# Patient Record
Sex: Male | Born: 1955 | Race: White | Hispanic: No | Smoking: Former smoker
Health system: Southern US, Community
[De-identification: ages and names within clinical notes are randomized; demographics above are authoritative.]

## PROBLEM LIST (undated history)

## (undated) ENCOUNTER — Ambulatory Visit (HOSPITAL_COMMUNITY): Admission: EM | Payer: Self-pay | Source: Home / Self Care

## (undated) DIAGNOSIS — E785 Hyperlipidemia, unspecified: Secondary | ICD-10-CM

## (undated) DIAGNOSIS — I2510 Atherosclerotic heart disease of native coronary artery without angina pectoris: Secondary | ICD-10-CM

## (undated) DIAGNOSIS — N200 Calculus of kidney: Secondary | ICD-10-CM

## (undated) DIAGNOSIS — I214 Non-ST elevation (NSTEMI) myocardial infarction: Principal | ICD-10-CM

## (undated) DIAGNOSIS — I10 Essential (primary) hypertension: Secondary | ICD-10-CM

## (undated) DIAGNOSIS — Z720 Tobacco use: Secondary | ICD-10-CM

## (undated) DIAGNOSIS — K219 Gastro-esophageal reflux disease without esophagitis: Secondary | ICD-10-CM

## (undated) HISTORY — DX: Calculus of kidney: N20.0

## (undated) HISTORY — DX: Non-ST elevation (NSTEMI) myocardial infarction: I21.4

---

## 2000-03-15 ENCOUNTER — Emergency Department (HOSPITAL_COMMUNITY): Admission: EM | Admit: 2000-03-15 | Discharge: 2000-03-15 | Payer: Self-pay | Admitting: Emergency Medicine

## 2000-03-17 ENCOUNTER — Emergency Department (HOSPITAL_COMMUNITY): Admission: EM | Admit: 2000-03-17 | Discharge: 2000-03-17 | Payer: Self-pay | Admitting: Emergency Medicine

## 2000-12-09 ENCOUNTER — Emergency Department (HOSPITAL_COMMUNITY): Admission: EM | Admit: 2000-12-09 | Discharge: 2000-12-09 | Payer: Self-pay | Admitting: Emergency Medicine

## 2000-12-09 ENCOUNTER — Encounter: Admission: RE | Admit: 2000-12-09 | Discharge: 2000-12-09 | Payer: Self-pay | Admitting: Internal Medicine

## 2000-12-13 ENCOUNTER — Encounter: Admission: RE | Admit: 2000-12-13 | Discharge: 2001-03-13 | Payer: Self-pay | Admitting: *Deleted

## 2001-01-10 ENCOUNTER — Encounter: Admission: RE | Admit: 2001-01-10 | Discharge: 2001-01-10 | Payer: Self-pay | Admitting: Hematology and Oncology

## 2001-10-04 DIAGNOSIS — E119 Type 2 diabetes mellitus without complications: Secondary | ICD-10-CM | POA: Insufficient documentation

## 2002-01-29 ENCOUNTER — Encounter: Admission: RE | Admit: 2002-01-29 | Discharge: 2002-01-29 | Payer: Self-pay | Admitting: Internal Medicine

## 2002-03-04 ENCOUNTER — Emergency Department (HOSPITAL_COMMUNITY): Admission: EM | Admit: 2002-03-04 | Discharge: 2002-03-05 | Payer: Self-pay | Admitting: Emergency Medicine

## 2002-03-04 ENCOUNTER — Encounter: Payer: Self-pay | Admitting: Emergency Medicine

## 2002-03-12 ENCOUNTER — Ambulatory Visit (HOSPITAL_COMMUNITY): Admission: RE | Admit: 2002-03-12 | Discharge: 2002-03-12 | Payer: Self-pay | Admitting: Internal Medicine

## 2002-03-12 ENCOUNTER — Encounter: Admission: RE | Admit: 2002-03-12 | Discharge: 2002-03-12 | Payer: Self-pay | Admitting: Internal Medicine

## 2002-03-14 ENCOUNTER — Encounter: Admission: RE | Admit: 2002-03-14 | Discharge: 2002-03-14 | Payer: Self-pay | Admitting: Internal Medicine

## 2002-03-16 ENCOUNTER — Inpatient Hospital Stay (HOSPITAL_COMMUNITY): Admission: RE | Admit: 2002-03-16 | Discharge: 2002-03-24 | Payer: Self-pay | Admitting: Cardiology

## 2002-03-16 HISTORY — PX: CORONARY ARTERY BYPASS GRAFT: SHX141

## 2002-03-17 ENCOUNTER — Encounter: Payer: Self-pay | Admitting: Cardiothoracic Surgery

## 2002-03-19 ENCOUNTER — Encounter: Payer: Self-pay | Admitting: Cardiothoracic Surgery

## 2002-03-20 ENCOUNTER — Encounter: Payer: Self-pay | Admitting: Cardiothoracic Surgery

## 2002-03-21 ENCOUNTER — Encounter: Payer: Self-pay | Admitting: Cardiothoracic Surgery

## 2002-03-22 ENCOUNTER — Encounter: Payer: Self-pay | Admitting: Cardiothoracic Surgery

## 2002-04-11 ENCOUNTER — Encounter (HOSPITAL_COMMUNITY): Admission: RE | Admit: 2002-04-11 | Discharge: 2002-07-10 | Payer: Self-pay | Admitting: Cardiology

## 2002-05-04 ENCOUNTER — Encounter: Admission: RE | Admit: 2002-05-04 | Discharge: 2002-08-02 | Payer: Self-pay | Admitting: Cardiothoracic Surgery

## 2002-06-19 ENCOUNTER — Encounter: Admission: RE | Admit: 2002-06-19 | Discharge: 2002-06-19 | Payer: Self-pay | Admitting: Internal Medicine

## 2003-06-12 ENCOUNTER — Encounter: Admission: RE | Admit: 2003-06-12 | Discharge: 2003-06-12 | Payer: Self-pay | Admitting: Internal Medicine

## 2003-09-05 ENCOUNTER — Emergency Department (HOSPITAL_COMMUNITY): Admission: EM | Admit: 2003-09-05 | Discharge: 2003-09-05 | Payer: Self-pay | Admitting: Emergency Medicine

## 2004-10-29 ENCOUNTER — Ambulatory Visit: Payer: Self-pay | Admitting: Internal Medicine

## 2004-11-03 ENCOUNTER — Ambulatory Visit: Payer: Self-pay | Admitting: Internal Medicine

## 2005-11-30 ENCOUNTER — Ambulatory Visit: Payer: Self-pay | Admitting: Internal Medicine

## 2006-02-11 ENCOUNTER — Inpatient Hospital Stay (HOSPITAL_COMMUNITY): Admission: EM | Admit: 2006-02-11 | Discharge: 2006-02-12 | Payer: Self-pay | Admitting: Emergency Medicine

## 2006-02-11 ENCOUNTER — Encounter (INDEPENDENT_AMBULATORY_CARE_PROVIDER_SITE_OTHER): Payer: Self-pay | Admitting: *Deleted

## 2006-02-11 ENCOUNTER — Ambulatory Visit: Payer: Self-pay | Admitting: Hospitalist

## 2006-05-16 ENCOUNTER — Ambulatory Visit: Payer: Self-pay | Admitting: Hospitalist

## 2006-09-24 DIAGNOSIS — E785 Hyperlipidemia, unspecified: Secondary | ICD-10-CM

## 2006-09-24 DIAGNOSIS — I10 Essential (primary) hypertension: Secondary | ICD-10-CM | POA: Insufficient documentation

## 2006-09-24 DIAGNOSIS — K219 Gastro-esophageal reflux disease without esophagitis: Secondary | ICD-10-CM

## 2006-09-24 DIAGNOSIS — F17200 Nicotine dependence, unspecified, uncomplicated: Secondary | ICD-10-CM

## 2006-09-24 DIAGNOSIS — H269 Unspecified cataract: Secondary | ICD-10-CM | POA: Insufficient documentation

## 2006-09-24 DIAGNOSIS — Z951 Presence of aortocoronary bypass graft: Secondary | ICD-10-CM

## 2007-07-24 ENCOUNTER — Telehealth: Payer: Self-pay | Admitting: *Deleted

## 2007-10-23 ENCOUNTER — Ambulatory Visit: Payer: Self-pay | Admitting: Internal Medicine

## 2007-10-23 ENCOUNTER — Encounter (INDEPENDENT_AMBULATORY_CARE_PROVIDER_SITE_OTHER): Payer: Self-pay | Admitting: *Deleted

## 2007-10-23 LAB — CONVERTED CEMR LAB
ALT: 19 units/L (ref 0–53)
AST: 17 units/L (ref 0–37)
Albumin: 4.1 g/dL (ref 3.5–5.2)
Alkaline Phosphatase: 127 units/L — ABNORMAL HIGH (ref 39–117)
BUN: 9 mg/dL (ref 6–23)
Basophils Absolute: 0 10*3/uL (ref 0.0–0.1)
Basophils Relative: 0 % (ref 0–1)
Blood Glucose, Fingerstick: 285
CO2: 24 meq/L (ref 19–32)
Calcium: 8.9 mg/dL (ref 8.4–10.5)
Chloride: 103 meq/L (ref 96–112)
Creatinine, Ser: 0.85 mg/dL (ref 0.40–1.50)
Eosinophils Absolute: 0.4 10*3/uL (ref 0.0–0.7)
Eosinophils Relative: 4 % (ref 0–5)
Glucose, Bld: 246 mg/dL — ABNORMAL HIGH (ref 70–99)
HCT: 48.6 % (ref 39.0–52.0)
Hemoglobin: 16.1 g/dL (ref 13.0–17.0)
Hgb A1c MFr Bld: 9.7 %
Lymphocytes Relative: 32 % (ref 12–46)
Lymphs Abs: 3.1 10*3/uL (ref 0.7–4.0)
MCHC: 33.1 g/dL (ref 30.0–36.0)
MCV: 88.8 fL (ref 78.0–100.0)
Monocytes Absolute: 0.7 10*3/uL (ref 0.1–1.0)
Monocytes Relative: 7 % (ref 3–12)
Neutro Abs: 5.7 10*3/uL (ref 1.7–7.7)
Neutrophils Relative %: 58 % (ref 43–77)
Platelets: 275 10*3/uL (ref 150–400)
Potassium: 4.3 meq/L (ref 3.5–5.3)
RBC: 5.47 M/uL (ref 4.22–5.81)
RDW: 13.1 % (ref 11.5–15.5)
Sodium: 139 meq/L (ref 135–145)
Total Bilirubin: 0.5 mg/dL (ref 0.3–1.2)
Total Protein: 7 g/dL (ref 6.0–8.3)
WBC: 9.9 10*3/uL (ref 4.0–10.5)

## 2007-10-24 DIAGNOSIS — R42 Dizziness and giddiness: Secondary | ICD-10-CM

## 2007-11-01 ENCOUNTER — Encounter (INDEPENDENT_AMBULATORY_CARE_PROVIDER_SITE_OTHER): Payer: Self-pay | Admitting: *Deleted

## 2007-11-01 ENCOUNTER — Ambulatory Visit: Payer: Self-pay | Admitting: Infectious Disease

## 2007-11-01 LAB — CONVERTED CEMR LAB
Cholesterol: 149 mg/dL (ref 0–200)
HDL: 29 mg/dL — ABNORMAL LOW (ref >39)
LDL Cholesterol: 93 mg/dL (ref 0–99)
Total CHOL/HDL Ratio: 5.1
Triglycerides: 136 mg/dL (ref <150)
VLDL: 27 mg/dL (ref 0–40)

## 2008-05-20 ENCOUNTER — Telehealth (INDEPENDENT_AMBULATORY_CARE_PROVIDER_SITE_OTHER): Payer: Self-pay | Admitting: Internal Medicine

## 2008-07-29 ENCOUNTER — Ambulatory Visit: Payer: Self-pay | Admitting: Internal Medicine

## 2008-07-29 ENCOUNTER — Encounter (INDEPENDENT_AMBULATORY_CARE_PROVIDER_SITE_OTHER): Payer: Self-pay | Admitting: Internal Medicine

## 2008-07-29 LAB — CONVERTED CEMR LAB
Blood Glucose, Fingerstick: 206
Creatinine, Urine: 190.5 mg/dL
Hgb A1c MFr Bld: 9.1 %
Microalb Creat Ratio: 24.6 mg/g (ref 0.0–30.0)
Microalb, Ur: 4.69 mg/dL — ABNORMAL HIGH (ref 0.00–1.89)
TSH: 2.595 microintl units/mL (ref 0.350–4.50)
Vitamin B-12: 400 pg/mL (ref 211–911)

## 2008-07-30 ENCOUNTER — Encounter (INDEPENDENT_AMBULATORY_CARE_PROVIDER_SITE_OTHER): Payer: Self-pay | Admitting: Internal Medicine

## 2008-09-05 ENCOUNTER — Emergency Department: Payer: Self-pay | Admitting: Emergency Medicine

## 2008-12-11 ENCOUNTER — Telehealth (INDEPENDENT_AMBULATORY_CARE_PROVIDER_SITE_OTHER): Payer: Self-pay | Admitting: Internal Medicine

## 2009-01-10 ENCOUNTER — Telehealth (INDEPENDENT_AMBULATORY_CARE_PROVIDER_SITE_OTHER): Payer: Self-pay | Admitting: *Deleted

## 2009-01-29 ENCOUNTER — Ambulatory Visit: Payer: Self-pay | Admitting: Internal Medicine

## 2009-01-29 ENCOUNTER — Encounter (INDEPENDENT_AMBULATORY_CARE_PROVIDER_SITE_OTHER): Payer: Self-pay | Admitting: Internal Medicine

## 2009-01-29 ENCOUNTER — Telehealth (INDEPENDENT_AMBULATORY_CARE_PROVIDER_SITE_OTHER): Payer: Self-pay | Admitting: Internal Medicine

## 2009-01-29 LAB — CONVERTED CEMR LAB
Blood Glucose, Fingerstick: 246
Blood Glucose, Home Monitor: 4 mg/dL
Creatinine, Urine: 198.8 mg/dL
Hgb A1c MFr Bld: 11 %
Microalb Creat Ratio: 18.6 mg/g (ref 0.0–30.0)
Microalb, Ur: 3.7 mg/dL — ABNORMAL HIGH (ref 0.00–1.89)

## 2009-01-30 LAB — CONVERTED CEMR LAB
Cholesterol: 235 mg/dL — ABNORMAL HIGH (ref 0–200)
HCT: 45.6 % (ref 39.0–52.0)
HDL: 34 mg/dL — ABNORMAL LOW (ref >39)
Hemoglobin: 15.3 g/dL (ref 13.0–17.0)
MCHC: 33.6 g/dL (ref 30.0–36.0)
MCV: 92.9 fL (ref 78.0–100.0)
Platelets: 232 10*3/uL (ref 150–400)
RBC: 4.91 M/uL (ref 4.22–5.81)
RDW: 13.5 % (ref 11.5–15.5)
Total CHOL/HDL Ratio: 6.9
Triglycerides: 413 mg/dL — ABNORMAL HIGH (ref <150)
WBC: 10.5 10*3/uL (ref 4.0–10.5)

## 2009-01-31 ENCOUNTER — Telehealth (INDEPENDENT_AMBULATORY_CARE_PROVIDER_SITE_OTHER): Payer: Self-pay | Admitting: Internal Medicine

## 2009-02-12 ENCOUNTER — Ambulatory Visit: Payer: Self-pay | Admitting: Infectious Disease

## 2009-02-12 ENCOUNTER — Telehealth (INDEPENDENT_AMBULATORY_CARE_PROVIDER_SITE_OTHER): Payer: Self-pay | Admitting: Internal Medicine

## 2009-02-12 ENCOUNTER — Encounter (INDEPENDENT_AMBULATORY_CARE_PROVIDER_SITE_OTHER): Payer: Self-pay | Admitting: Internal Medicine

## 2009-02-12 LAB — CONVERTED CEMR LAB: Blood Glucose, Fingerstick: 135

## 2009-02-17 ENCOUNTER — Telehealth: Payer: Self-pay | Admitting: *Deleted

## 2009-02-17 LAB — CONVERTED CEMR LAB
ALT: 27 units/L (ref 0–53)
AST: 27 units/L (ref 0–37)
Albumin: 4.2 g/dL (ref 3.5–5.2)
Alkaline Phosphatase: 113 units/L (ref 39–117)
BUN: 14 mg/dL (ref 6–23)
CO2: 24 meq/L (ref 19–32)
Calcium: 9.4 mg/dL (ref 8.4–10.5)
Chloride: 102 meq/L (ref 96–112)
Cholesterol: 188 mg/dL (ref 0–200)
Creatinine, Ser: 0.71 mg/dL (ref 0.40–1.50)
GFR calc Af Amer: >60 mL/min (ref >60)
GFR calc non Af Amer: >60 mL/min (ref >60)
Glucose, Bld: 98 mg/dL (ref 70–99)
HDL: 34 mg/dL — ABNORMAL LOW (ref >39)
LDL Cholesterol: 130 mg/dL — ABNORMAL HIGH (ref 0–99)
Potassium: 4.3 meq/L (ref 3.5–5.3)
Sodium: 138 meq/L (ref 135–145)
Total Bilirubin: 1 mg/dL (ref 0.3–1.2)
Total CHOL/HDL Ratio: 5.5
Total Protein: 7.1 g/dL (ref 6.0–8.3)
Triglycerides: 122 mg/dL (ref <150)
VLDL: 24 mg/dL (ref 0–40)

## 2009-02-26 ENCOUNTER — Encounter (INDEPENDENT_AMBULATORY_CARE_PROVIDER_SITE_OTHER): Payer: Self-pay | Admitting: Internal Medicine

## 2009-03-12 ENCOUNTER — Telehealth (INDEPENDENT_AMBULATORY_CARE_PROVIDER_SITE_OTHER): Payer: Self-pay | Admitting: Internal Medicine

## 2009-03-14 ENCOUNTER — Ambulatory Visit: Payer: Self-pay | Admitting: Internal Medicine

## 2009-03-14 ENCOUNTER — Encounter (INDEPENDENT_AMBULATORY_CARE_PROVIDER_SITE_OTHER): Payer: Self-pay | Admitting: Internal Medicine

## 2009-03-14 LAB — CONVERTED CEMR LAB

## 2009-05-23 ENCOUNTER — Telehealth (INDEPENDENT_AMBULATORY_CARE_PROVIDER_SITE_OTHER): Payer: Self-pay | Admitting: Internal Medicine

## 2009-06-19 ENCOUNTER — Telehealth (INDEPENDENT_AMBULATORY_CARE_PROVIDER_SITE_OTHER): Payer: Self-pay | Admitting: *Deleted

## 2009-08-28 ENCOUNTER — Ambulatory Visit: Payer: Self-pay | Admitting: Internal Medicine

## 2009-08-28 ENCOUNTER — Encounter: Payer: Self-pay | Admitting: Internal Medicine

## 2009-08-28 ENCOUNTER — Inpatient Hospital Stay (HOSPITAL_COMMUNITY): Admission: EM | Admit: 2009-08-28 | Discharge: 2009-08-30 | Payer: Self-pay | Admitting: Emergency Medicine

## 2009-08-28 ENCOUNTER — Ambulatory Visit: Payer: Self-pay | Admitting: Cardiovascular Disease

## 2009-08-29 ENCOUNTER — Encounter: Payer: Self-pay | Admitting: Cardiovascular Disease

## 2009-08-29 ENCOUNTER — Encounter: Payer: Self-pay | Admitting: Internal Medicine

## 2009-08-30 ENCOUNTER — Encounter (INDEPENDENT_AMBULATORY_CARE_PROVIDER_SITE_OTHER): Payer: Self-pay | Admitting: Internal Medicine

## 2009-08-31 LAB — CONVERTED CEMR LAB: Hgb A1c MFr Bld: 7.8 %

## 2009-09-30 ENCOUNTER — Ambulatory Visit: Payer: Self-pay | Admitting: Internal Medicine

## 2009-11-18 ENCOUNTER — Encounter (INDEPENDENT_AMBULATORY_CARE_PROVIDER_SITE_OTHER): Payer: Self-pay | Admitting: Internal Medicine

## 2010-01-06 ENCOUNTER — Encounter (INDEPENDENT_AMBULATORY_CARE_PROVIDER_SITE_OTHER): Payer: Self-pay | Admitting: Internal Medicine

## 2010-01-19 ENCOUNTER — Telehealth (INDEPENDENT_AMBULATORY_CARE_PROVIDER_SITE_OTHER): Payer: Self-pay | Admitting: Internal Medicine

## 2010-06-01 ENCOUNTER — Telehealth: Payer: Self-pay | Admitting: Internal Medicine

## 2010-06-01 ENCOUNTER — Telehealth: Payer: Self-pay | Admitting: *Deleted

## 2010-06-28 ENCOUNTER — Emergency Department (HOSPITAL_COMMUNITY): Admission: EM | Admit: 2010-06-28 | Discharge: 2010-06-28 | Payer: Self-pay | Admitting: Emergency Medicine

## 2010-09-07 ENCOUNTER — Ambulatory Visit: Payer: Self-pay | Admitting: Internal Medicine

## 2010-09-07 DIAGNOSIS — M25559 Pain in unspecified hip: Secondary | ICD-10-CM | POA: Insufficient documentation

## 2010-09-07 LAB — CONVERTED CEMR LAB
BUN: 21 mg/dL (ref 6–23)
Blood Glucose, Fingerstick: 190
CO2: 26 meq/L (ref 19–32)
Calcium: 9.5 mg/dL (ref 8.4–10.5)
Chloride: 104 meq/L (ref 96–112)
Creatinine, Ser: 0.86 mg/dL (ref 0.40–1.50)
Creatinine, Urine: 141.1 mg/dL
Glucose, Bld: 135 mg/dL — ABNORMAL HIGH (ref 70–99)
Hgb A1c MFr Bld: 9.1 %
Microalb Creat Ratio: 14.7 mg/g (ref 0.0–30.0)
Microalb, Ur: 2.08 mg/dL — ABNORMAL HIGH (ref 0.00–1.89)
Potassium: 4.7 meq/L (ref 3.5–5.3)
Sodium: 138 meq/L (ref 135–145)

## 2010-09-23 ENCOUNTER — Encounter: Payer: Self-pay | Admitting: Family Medicine

## 2010-09-23 ENCOUNTER — Ambulatory Visit: Payer: Self-pay | Admitting: Sports Medicine

## 2010-09-23 ENCOUNTER — Encounter: Admission: RE | Admit: 2010-09-23 | Discharge: 2010-09-23 | Payer: Self-pay | Source: Home / Self Care

## 2010-10-21 ENCOUNTER — Ambulatory Visit: Admit: 2010-10-21 | Payer: Self-pay | Admitting: Sports Medicine

## 2010-11-05 NOTE — Assessment & Plan Note (Signed)
Summary: NEED MEDICATION/SB.   Vital Signs:  Patient profile:   55 year old male Height:      70 inches (177.80 cm) Weight:      220.7 pounds (100.32 kg) BMI:     31.78 Temp:     97.5 degrees F (36.39 degrees C) oral Pulse rate:   75 / minute BP sitting:   138 / 83  (right arm) Cuff size:   large  Vitals Entered By: Chinita Pester RN (September 07, 2010 8:59 AM) CC: Check-up.  Needs med.refills.  Also needs flu/pneumonia shots. Has been having right hip/leg pain. Is Patient Diabetic? Yes Did you bring your meter with you today? No Pain Assessment Patient in pain? no      Nutritional Status BMI of > 30 = obese CBG Result 190  Have you ever been in a relationship where you felt threatened, hurt or afraid?No   Does patient need assistance? Functional Status Self care Ambulation Normal   Diabetic Foot Exam Last Podiatry Exam Date: 09/07/2010  Foot Inspection Is there a history of a foot ulcer?              No Is there a foot ulcer now?              No Can the patient see the bottom of their feet?          Yes Are the shoes appropriate in style and fit?          Yes Is there swelling or an abnormal foot shape?          No Are the toenails long?                No Are the toenails thick?                No Are the toenails ingrown?              No Is there heavy callous build-up?              No Is there pain in the calf muscle (Intermittent claudication) when walking?    NoIs there a claw toe deformity?              No Is there elevated skin temperature?            No Is there limited ankle dorsiflexion?            No Is there foot or ankle muscle weakness?            No  Diabetic Foot Care Education Patient educated on appropriate care of diabetic feet.  Pulse Check          Right Foot          Left Foot Dorsalis Pedis:        normal            normal Comments: Callus on bottom of each foot. High Risk Feet? No   10-g (5.07) Semmes-Weinstein Monofilament  Test Performed by: Chinita Pester RN          Right Foot          Left Foot Visual Inspection     normal         normal Test Control      normal         normal Site 1         normal         normal  Site 2         normal         normal Site 3         normal         normal Site 4         normal         normal Site 5         normal         normal Site 6         normal         normal Site 7         normal         normal Site 8         normal         normal Site 9         normal         normal  Impression      normal         normal   Primary Care Provider:  Elyse Jarvis  CC:  Check-up.  Needs med.refills.  Also needs flu/pneumonia shots. Has been having right hip/leg pain.Marland Kitchen  History of Present Illness: Pt with pmh outlined below here for followup visit. Hasn't been seen in about a year. Today he needs refills and also complains of right hip pain since August. States that he went to the emergency room in October for acute right hip pain, states the xrays were normal and was given Oxycodone for pain relief. He describes the pain as a dull ache that's constant, radiates to his right thigh. He has not taken any otc meds for the pain. It is not associated with fever, back pain, numbness or tingling, or any other focal deficits. He cannot identify any specific triggers and states that it is persistent for most of the day and night.    Depression History:      The patient denies a depressed mood most of the day and a diminished interest in his usual daily activities.        Comments:  But being out of work has been stressful.   Preventive Screening-Counseling & Management  Alcohol-Tobacco     Alcohol drinks/day: <1     Smoking Status: current     Smoking Cessation Counseling: yes     Smoke Cessation Stage: ready     Packs/Day: 0.75  Caffeine-Diet-Exercise     Does Patient Exercise: no  Current Problems (verified): 1)  Hip Pain, Right  (ICD-719.45) 2)  Dizziness  (ICD-780.4) 3)  Gerd   (ICD-530.81) 4)  Cataracts  (ICD-366.9) 5)  Tobacco Abuse  (ICD-305.1) 6)  Dyslipidemia  (ICD-272.4) 7)  Coronary Artery Bypass Graft, Three Vessel, Hx of  (ICD-V45.81) 8)  Hypertension  (ICD-401.9) 9)  Diabetes Mellitus, Type II  (ICD-250.00)  Current Medications (verified): 1)  Metformin Hcl 1000 Mg  Tabs (Metformin Hcl) .... Take 1 Tablet By Mouth Twice Daily For Diabetes. 2)  Lisinopril 10 Mg Tabs (Lisinopril) .... Take One Tablet Daily For Blood Pressure. 3)  Ranitidine Hcl 150 Mg Caps (Ranitidine Hcl) .... Take 1 Pill By Mouth Two Times A Day As Needed For Acid Reflux. 4)  Anacin 81 Mg Tbec (Aspirin) .... Take 1 Pill By Mouth Daily. 5)  Pravachol 40 Mg Tabs (Pravastatin Sodium) .... Take 1 Pill By Mouth Daily. 6)  Insulin Syringe 31g X 5/16" 0.5 Ml Misc (Insulin Syringe-Needle U-100) .... Use To Inject Insulin Twice Daily 7)  Truetrack Test  Strp (Glucose Blood) .... Use To Check Blood Sugar4-6x Daily 8)  Lancets  Misc (Lancets) .... Use To Check Blood Sugar 4-6x Daily 9)  Novolin 70/30 70-30 % Susp (Insulin Isophane & Regular) .... Inject Into The Skin of Your Abdomen 35 Units in The Morning and 15 Units in The Evening. 10)  Cialis 10 Mg Tabs (Tadalafil) .... Take 1 Pill By Mouth Daily As Needed 30 Minutes Before Anticipated Sexual Activity. Don't Take With Nitrate Containing Drugs. 11)  Metoprolol Tartrate 25 Mg Tabs (Metoprolol Tartrate) .... Take 1 Pill By Mouth Two Times A Day. 12)  Nicoderm Cq 14 Mg/24hr Pt24 (Nicotine) .... Apply 1 Patch On Your Skin Daily. 13)  Ibuprofen 600 Mg Tabs (Ibuprofen) .... Take One Tablet Every 6 Hours As Needed For Pain. Take With Meals!  Allergies (verified): No Known Drug Allergies  Past History:  Past Medical History: Last updated: 09/24/2006 Diabetes mellitus, type II Hypertension GERD  Past Surgical History: Last updated: 01/29/2009 Coronary artery bypass graft three vessle 2004 at [facility].  Family History: Last updated:  10/23/2007 Mother: living, 30: hypoglycemia Father: living, 22: MI (at age 71), colon CA ( ~1y ago) Siblings: 1 sister (healthy) Children: healthy  Social History: Last updated: 10/23/2007 Works as a Location manager.  Divorced.  Lives in [place].  Lives with his 3 children. Smokes 1ppd x 30y.  Drinks 1-2 beers/week.  No illicit drugs.  Risk Factors: Alcohol Use: <1 (09/07/2010) Exercise: no (09/07/2010)  Risk Factors: Smoking Status: current (09/07/2010) Packs/Day: 0.75 (09/07/2010)  Social History: Packs/Day:  0.75  Physical Exam  General:  alert.  well-developed.   Head:  normocephalic and atraumatic.   Eyes:  vision grossly intact.   Ears:  no external deformities.   Nose:  no external deformity and no nasal discharge.   Lungs:  normal respiratory effort, normal breath sounds, no crackles, and no wheezes.   Heart:  normal rate, regular rhythm, no gallop, and no rub.   Abdomen:  soft, non-tender, and normal bowel sounds.   Msk:  normal ROM, no joint tenderness, no joint swelling, and no joint warmth.   Pulses:  R and L radial, dorsalis pedis and posterior tibial pulses are full and equal bilaterally Extremities:  no edema or cyanosis Neurologic:  alert & oriented X3, strength normal in all extremities, sensation intact to light touch, and gait normal.   Skin:  color normal.   Psych:  normally interactive.    Diabetes Management Exam:    Foot Exam (with socks and/or shoes not present):       Sensory-Monofilament:          Left foot: normal          Right foot: normal   Impression & Recommendations:  Problem # 1:  HIP PAIN, RIGHT (ICD-719.45) From history and physical, sounds like an osteoarthritis, unlikely 2/2 nerve impingement given his description. For now, encouraged he tried Ibuprofen on an as needed basis over the next month, arrange a referral to sports medicine and see how he does. He is to come back in about a month so we can see how his doing.   His updated  medication list for this problem includes:    Anacin 81 Mg Tbec (Aspirin) .Marland Kitchen... Take 1 pill by mouth daily.    Ibuprofen 600 Mg Tabs (Ibuprofen) .Marland Kitchen... Take one tablet every 6 hours as needed for pain. take with meals!  Orders: Sports Medicine (Sports Med)  Problem # 2:  DIABETES MELLITUS, TYPE II (ICD-250.00) Not well controlled 2/2 noncompliance. Pt encouraged to take his med exactly as prescribed so we can better manage his med regimen. Will check labs as below since he hasn't been here for over a year. Foot exam ok, will arrange for diabetic eye exam.  No changes to med regimen today.   His updated medication list for this problem includes:    Metformin Hcl 1000 Mg Tabs (Metformin hcl) .Marland Kitchen... Take 1 tablet by mouth twice daily for diabetes.    Lisinopril 10 Mg Tabs (Lisinopril) .Marland Kitchen... Take one tablet daily for blood pressure.    Anacin 81 Mg Tbec (Aspirin) .Marland Kitchen... Take 1 pill by mouth daily.    Novolin 70/30 70-30 % Susp (Insulin isophane & regular) ..... Inject into the skin of your abdomen 35 units in the morning and 15 units in the evening.  Orders: T- Capillary Blood Glucose (82948) T-Hgb A1C (in-house) (45409WJ) T-Urine Microalbumin w/creat. ratio [id number]) T-Basic Metabolic Panel ([phone]) Ophthalmology Referral (Ophthalmology) T-Urine Microalbumin w/creat. ratio [phone])  Labs Reviewed: Creat: 0.71 (02/12/2009)     Last Eye Exam: No diabetic retinopathy.   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 I encouraged that he take his meds exactly as prescribed and to try life style modification with dieting and exercise.  His updated medication  list for this problem includes:    Lisinopril 10 Mg Tabs (Lisinopril) .Marland Kitchen... Take one tablet daily for blood pressure.    Metoprolol Tartrate 25 Mg Tabs (Metoprolol tartrate) .Marland Kitchen... Take 1 pill by mouth two times a day.  BP today: 138/83 Prior BP: 141/82 (09/30/2009)  Labs Reviewed: K+: 4.3 (02/12/2009) Creat: : 0.71 (02/12/2009)   Chol: 188 (02/12/2009)   HDL: 34 (02/12/2009)   LDL: 130 (02/12/2009)   TG: 122 (02/12/2009)  Problem # 4:  DYSLIPIDEMIA (ICD-272.4) Assessment: Comment Only No changes to meds today. Check FLP at next visit.   His updated medication list for this problem includes:    Pravachol 40 Mg Tabs (Pravastatin sodium) .Marland Kitchen... Take 1 pill by mouth daily.  Labs Reviewed: SGOT: 27 (02/12/2009)   SGPT: 27 (02/12/2009)   HDL:34 (02/12/2009), 34 (01/29/2009)  LDL:130 (02/12/2009), See Comment mg/dL (72/53/6644)  IHKV:425 (02/12/2009), 235 (01/29/2009)  Trig:122 (02/12/2009), 413 (01/29/2009)  Complete Medication List: 1)  Metformin Hcl 1000 Mg Tabs (Metformin hcl) .... Take 1 tablet by mouth twice daily for diabetes. 2)  Lisinopril 10 Mg Tabs (Lisinopril) .... Take one tablet daily for blood pressure. 3)  Ranitidine Hcl 150 Mg Caps (Ranitidine hcl) .... Take 1 pill by mouth two times a day as needed for acid reflux. 4)  Anacin 81 Mg Tbec (Aspirin) .... Take 1 pill by mouth daily. 5)  Pravachol 40 Mg Tabs (Pravastatin sodium) .... Take 1 pill by mouth daily. 6)  Insulin Syringe 31g X 5/16" 0.5 Ml Misc (Insulin syringe-needle u-100) .... Use to inject insulin twice daily 7)  Truetrack Test Strp (Glucose blood) .... Use to check blood sugar4-6x daily 8)  Lancets Misc (Lancets) .... Use to check blood sugar 4-6x daily 9)  Novolin 70/30 70-30 % Susp (Insulin isophane & regular) .... Inject into the skin of your abdomen 35 units in  the morning and 15 units in the evening. 10)  Cialis 10 Mg Tabs (Tadalafil) .... Take 1 pill by mouth daily as needed 30 minutes before anticipated  sexual activity. don't take with nitrate containing drugs. 11)  Metoprolol Tartrate 25 Mg Tabs (Metoprolol tartrate) .... Take 1 pill by mouth two times a day. 12)  Nicoderm Cq 14 Mg/24hr Pt24 (Nicotine) .... Apply 1 patch on your skin daily. 13)  Ibuprofen 600 Mg Tabs (Ibuprofen) .... Take one tablet every 6 hours as needed for pain. take with meals!  Other Orders: T-Hemoccult Card-Multiple (take home) (16109) Influenza Vaccine NON MCR (60454)  Patient Instructions: 1)  Pls make sure to take all your meds exactly as prescribed. 2)  Pls record your blood glucose levels at least twice daily. 3)  Call the clinic if you have any questions or concerns. 4)  Please schedule a follow-up appointment in 3 months. Prescriptions: IBUPROFEN 600 MG TABS (IBUPROFEN) take one tablet every 6 hours as needed for pain. Take with meals!  #30 x 0   Entered and Authorized by:   Jaci Lazier MD   Signed by:   Jaci Lazier MD on 09/07/2010   Method used:   Print then Give to Patient   RxID:   [id number] NOVOLIN 70/30 70-30 % SUSP (INSULIN ISOPHANE & REGULAR) Inject into the skin of your abdomen 35 units in the morning and 15 units in the evening.  #1 month x 2   Entered and Authorized by:   Jaci Lazier MD   Signed by:   Jaci Lazier MD on 09/07/2010   Method used:   Print then Give to Patient   RxID:   [id number] LANCETS  MISC (LANCETS) use to check blood sugar 4-6x daily  #200 x 11   Entered and Authorized by:   Jaci Lazier MD   Signed by:   Jaci Lazier MD on 09/07/2010   Method used:   Print then Give to Patient   RxID:   [id number] TEST  STRP (GLUCOSE BLOOD) use to check blood sugar4-6x daily  #200 x 11   Entered and Authorized by:   Jaci Lazier MD   Signed by:   Jaci Lazier MD on 09/07/2010   Method used:   Print then Give to Patient   RxID:   [id number] INSULIN SYRINGE 31G X 5/16" 0.5 ML MISC (INSULIN SYRINGE-NEEDLE U-100) use to inject insulin twice daily   #100 x 2   Entered and Authorized by:   Jaci Lazier MD   Signed by:   Jaci Lazier MD on 09/07/2010   Method used:   Print then Give to Patient   RxID:   [id number] METFORMIN HCL 1000 MG  TABS (METFORMIN HCL) Take 1 tablet by mouth twice daily for diabetes.  #60 x 2   Entered and Authorized by:   Jaci Lazier MD   Signed by:   Jaci Lazier MD on 09/07/2010   Method used:   Print then Give to Patient   RxID:   [id number]    Orders Added: 1)  T- Capillary Blood Glucose [82948] 2)  T-Hgb A1C (in-house) [23762GB] 3)  Sports Medicine [Sports Med] 4)  T-Urine Microalbumin w/creat. ratio [[id number]] 5)  T-Basic Metabolic Panel [80048-22910] 6)  T-Hemoccult Card-Multiple (take home) [82270] 7)  Ophthalmology Referral [Ophthalmology] 8)  T-Urine Microalbumin w/creat. ratio [[id number]] 9)  Influenza Vaccine NON MCR [00028] 10)  Est. Patient Level IV [15176]   Immunizations Administered:  Influenza Vaccine # 1:  Vaccine Type: Fluvax Non-MCR    Site: right deltoid    Mfr: GlaxoSmithKline    Dose: 0.5 ml    Route: IM    Given by: Chinita Pester RN    Exp. Date: 04/03/2011    Lot #: [id number]    VIS given: 04/28/10 version given September 07, 2010.  Flu Vaccine Consent Questions:    Do you have a history of severe allergic reactions to this vaccine? no    Any prior history of allergic reactions to egg and/or gelatin? no    Do you have a sensitivity to the preservative Thimersol? no    Do you have a past history of Guillan-Barre Syndrome? no    Do you currently have an acute febrile illness? no    Have you ever had a severe reaction to latex? no    Vaccine information given and explained to patient? yes   Immunizations Administered:  Influenza Vaccine # 1:    Vaccine Type: Fluvax Non-MCR    Site: right deltoid    Mfr: GlaxoSmithKline    Dose: 0.5 ml    Route: IM    Given by: Chinita Pester RN    Exp. Date: 04/03/2011    Lot #: [id number]    VIS  given: 04/28/10 version given September 07, 2010.  Prevention & Chronic Care Immunizations   Influenza vaccine: Fluvax Non-MCR  (09/07/2010)    Tetanus booster: Not documented    Pneumococcal vaccine: Not documented  Colorectal Screening   Hemoccult: Not documented   Hemoccult action/deferral: Ordered  (09/07/2010)    Colonoscopy: Not documented  Other Screening   PSA: Not documented   Smoking status: current  (09/07/2010)   Smoking cessation counseling: yes  (09/07/2010)  Diabetes Mellitus   HgbA1C: 9.1  (09/07/2010)    Eye exam: No diabetic retinopathy.   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 Visual acuity OD :20/40      Visual acuity OS :20/50      Intraocular pressure OD:13 Intraocular pressure OS: 15 Exam done by Liliane Bade    (02/26/2009)   Diabetic eye exam action/deferral: Ophthalmology referral  (09/07/2010)   Eye exam due: 03/2010    Foot exam: yes  (09/07/2010)   Foot exam action/deferral: Do today   High risk foot: No  (09/07/2010)   Foot care education: Done  (09/07/2010)    Urine microalbumin/creatinine ratio: 18.6  (01/29/2009)   Urine microalbumin action/deferral: Ordered    Diabetes flowsheet reviewed?: Yes   Progress toward A1C goal: Deteriorated  Lipids   Total Cholesterol: 188  (02/12/2009)   Lipid panel action/deferral: Deferred   LDL: 130  (02/12/2009)   LDL Direct: Not documented   HDL: 34  (02/12/2009)   Triglycerides: 122  (02/12/2009)    SGOT (AST): 27  (02/12/2009)   SGPT (ALT): 27  (02/12/2009)   Alkaline phosphatase: 113  (02/12/2009)   Total bilirubin: 1.0  (02/12/2009)    Lipid flowsheet reviewed?: Yes   Progress toward LDL goal: Unchanged  Hypertension   Last Blood Pressure: 138 / 83  (09/07/2010)   Serum creatinine: 0.71  (02/12/2009)   Serum potassium 4.3  (02/12/2009)    Hypertension flowsheet reviewed?: Yes   Progress toward BP goal: Unchanged  Self-Management Support :   Personal Goals  (by the next clinic visit) :     Personal A1C goal: 7  (09/30/2009)     Personal blood pressure goal: 140/90  (09/30/2009)     Personal LDL goal: 70  (  09/30/2009)    Diabetes self-management support: Education handout  (09/07/2010)   Diabetes education handout printed   Last diabetes self-management training by diabetes educator: 03/14/2009    Hypertension self-management support: Education handout  (09/07/2010)   Hypertension education handout printed    Lipid self-management support: Education handout  (09/07/2010)     Lipid education handout printed   Nursing Instructions: Diabetic foot exam today Give Flu vaccine today Provide Hemoccult cards with instructions (see order) Refer for screening diabetic eye exam (see order)   Process Orders Check Orders Results:     [facility]: ABN not required for this insurance Tests Sent for requisitioning (September 07, 2010 2:05 PM):     09/07/2010: [facility] -- T-Urine Microalbumin w/creat. ratio [[id number]] (signed)     09/07/2010: [facility] -- T-Basic Metabolic Panel [id number] (signed)     09/07/2010: [facility] -- T-Urine Microalbumin w/creat. ratio [[id number]] (signed)     Laboratory Results   Blood Tests   Date/Time Received: September 07, 2010 9:15 AM  Date/Time Reported: Burke Keels  September 07, 2010 9:15 AM   HGBA1C: 9.1%   (Normal Range: Non-Diabetic - 3-6%   Control Diabetic - 6-8%) CBG Random:: 190mg /dL  Comments: Patient had Diet Coke only Burke Keels  September 07, 2010 9:16 AM

## 2010-11-05 NOTE — Progress Notes (Signed)
Summary: med refill/gp  Phone Note Refill Request Message from:  Fax from Pharmacy on January 19, 2010 4:16 PM  Refills Requested: Medication #1:  RANITIDINE HCL 150 MG CAPS take 1 pill by mouth two times a day as needed for acid reflux. Request 90 days supply.   Method Requested: Electronic Initial call taken by: Chinita Pester RN,  January 19, 2010 4:16 PM  Follow-up for Phone Call       Follow-up by: Jason Coop MD,  January 22, 2010 8:19 PM    Prescriptions: RANITIDINE HCL 150 MG CAPS (RANITIDINE HCL) take 1 pill by mouth two times a day as needed for acid reflux.  #180 x 0   Entered and Authorized by:   Jason Coop MD   Signed by:   Jason Coop MD on 01/22/2010   Method used:   [facility] to        [id number]       [address]       [place], [place]  [address]       Ph: [phone] or [phone]       Fax: [phone]   RxID:   [id number]

## 2010-11-05 NOTE — Progress Notes (Signed)
Summary: refill/ hla  Phone Note Refill Request Message from:  Patient on June 01, 2010 5:22 PM  Refills Requested: Medication #1:  NOVOLIN 70/30 70-30 % SUSP Inject into the skin of your abdomen 35 units in the morning and 15 units in the evening.   Dosage confirmed as above?Dosage Confirmed   Supply Requested: 3 months  Medication #2:  INSULIN SYRINGE 31G X 5/16" 0.5 ML MISC use to inject insulin twice daily   Dosage confirmed as above?Dosage Confirmed   Supply Requested: 3 months  Medication #3:  METFORMIN HCL 1000 MG  TABS Take 1 tablet by mouth twice daily for diabetes.   Dosage confirmed as above?Dosage Confirmed   Supply Requested: 3 months has been out of insulin x 1 wk. pt refuses appt due to costs of appt, made aware that he needs to see dhill, read him the list of paperwork she needs, he states he will try to see her tomorrow  Initial call taken by: Marin Roberts RN,  June 01, 2010 5:28 PM  Follow-up for Phone Call        We will work with Mr. Dentler given his financial struggles but he needs to work with Ms. Hill so that we can assist him withhis medical care.  He will also be required to be seen at least once per year so that we can be assured he is receiving apporpriate therapy.  Once his financial issues with the clinic have been addressed we can see him more often, as clinically indicated.  Will give a 3 month supply of the requested medications/supplies. Follow-up by: Doneen Poisson MD,  June 02, 2010 8:46 AM    Prescriptions: NOVOLIN 70/30 70-30 % SUSP (INSULIN ISOPHANE & REGULAR) Inject into the skin of your abdomen 35 units in the morning and 15 units in the evening.  #1 month x 2   Entered and Authorized by:   Doneen Poisson MD   Signed by:   Doneen Poisson MD on 06/02/2010   Method used:   [facility]        [facility] [id number]       [address]       [place], [place]  [address]       Ph: [phone] or  [phone]       Fax: [phone]   RxID:   [id number] INSULIN SYRINGE 31G X 5/16" 0.5 ML MISC (INSULIN SYRINGE-NEEDLE U-100) use to inject insulin twice daily  #100 x 2   Entered and Authorized by:   Doneen Poisson MD   Signed by:   Doneen Poisson MD on 06/02/2010   Method used:   [facility]        [id number]       [address]       [place], [place]  [address]       Ph: [phone] or [phone]       Fax: [phone]   RxID:   [id number] METFORMIN HCL 1000 MG  TABS (METFORMIN HCL) Take 1 tablet by mouth twice daily for diabetes.  #60 x 2   Entered and Authorized by:   Doneen Poisson MD   Signed by:   Doneen Poisson MD on 06/02/2010   Method used:   [facility]        [facility]  [address] [phone][facility] (retail)  [address]       [place], [place]  [address]       Ph: [phone] or [phone]       Fax: [phone]   RxID:   [id number]   Appended Document: refill/ hla donna, could you maybe call this pt and reinforce his need to see dhill, he states he has no income at present. thanks as always  Appended Document: refill/ hla meds called into [facility] Pt will make appointment today to be seen

## 2010-11-05 NOTE — Medication Information (Signed)
Summary: [facility]  [facility]   Imported By: Florinda Marker 11/18/2009 16:33:26  _____________________________________________________________________  External Attachment:    Type:   Image     Comment:   External Document

## 2010-11-05 NOTE — Medication Information (Signed)
Summary: [facility]   Imported By: Margie Billet 01/27/2010 12:06:05  _____________________________________________________________________  External Attachment:    Type:   Image     Comment:   External Document

## 2010-11-05 NOTE — Assessment & Plan Note (Signed)
Summary: NP/RT HIP PAIN/[facility]   Vital Signs:  Patient profile:   55 year old male Height:      70 inches (177.80 cm) Weight:      220 pounds (100.00 kg) BMI:     31.68 Temp:     98.4 degrees F (36.89 degrees C) oral Pulse rate:   72 / minute BP sitting:   131 / 87  (right arm)  Vitals Entered By: Lillia Pauls CMA (September 23, 2010 2:59 PM) CC: RIGHT HIP PAIN Pain Assessment Patient in pain? yes     Location: RIGHT HIP Intensity: 6 Nutritional Status BMI of > 30 = obese  Does patient need assistance? Functional Status Self care Ambulation Normal   Primary Provider:  Elyse Jarvis  CC:  RIGHT HIP PAIN.  History of Present Illness: 55 yo M with poorly controlled DM 2 referred by IM clinic for eval of Rt hip pain.  Began 5 months ago, unclear trigger.  Mostly lateral location, radiates down side of his leg to knee, but normally not past his knee.  Sharp pain.  Also with some radiation to groin.  10/10 at its worst.  Pain with walking and lying on that side, but none at present just sitting.  States pain so bad 2 months ago that he went to ER, had xrays that he states were normal (no record in [facility]), given oxycodone but doesn't help much.  Intermittently takes advil which does help.  No pain really posterior.  No radicular type pain down back of his leg. Denies heavy Etoh history.  Preventive Screening-Counseling & Management  Alcohol-Tobacco     Smoking Status: current     Packs/Day: 0.5  Allergies (verified): No Known Drug Allergies  Social History: Packs/Day:  0.5  Physical Exam  General:  overweight-appearing.  smells of smoke Msk:  Back: neg SLR b/l supine and seated.  FABER causes no back pain but does cause lateral hip pain on Rt.  tight HS  Rt hip: Dec ROM with flex to 90 deg, IR to 25 deg, ER to 40 deg.  + log roll.  + ttp over greater troch.  + weakness with resisted abduction b/l, RT weaker than Lt.  No ttp over sciatic notch   Impression &  Recommendations:  Problem # 1:  HIP PAIN, RIGHT (ICD-719.45) Assessment Unchanged He has exam findings worrisome for both troch bursitis and hip OA.  I don't think this is referred from his back. - Declined troch bursa injection - check 2-V of bilateral hips since I am unable to find these in E chart - gave him series of hip stretches to concentrate on external rotators and IT band.  Also needs HS flexibility. - hip abduction strength exercises - continue nsaids as needed for pain - f/u 4 weeks, I plan to  call him sooner regarding results of his xrays - at f/u visit, will more thoroughly assess leg lengths, gait, etc - based on xrays, may also consider diagnostic IA hip injection in future  His updated medication list for this problem includes:    Anacin 81 Mg Tbec (Aspirin) .Marland Kitchen... Take 1 pill by mouth daily.    Ibuprofen 600 Mg Tabs (Ibuprofen) .Marland Kitchen... Take one tablet every 6 hours as needed for pain. take with meals!  Orders: Radiology other (Radiology Other)  Complete Medication List: 1)  Metformin Hcl 1000 Mg Tabs (Metformin hcl) .... Take 1 tablet by mouth twice daily for diabetes. 2)  Lisinopril 10 Mg Tabs (Lisinopril) .Marland KitchenMarland KitchenMarland Kitchen  Take one tablet daily for blood pressure. 3)  Ranitidine Hcl 150 Mg Caps (Ranitidine hcl) .... Take 1 pill by mouth two times a day as needed for acid reflux. 4)  Anacin 81 Mg Tbec (Aspirin) .... Take 1 pill by mouth daily. 5)  Pravachol 40 Mg Tabs (Pravastatin sodium) .... Take 1 pill by mouth daily. 6)  Insulin Syringe 31g X 5/16" 0.5 Ml Misc (Insulin syringe-needle u-100) .... Use to inject insulin twice daily 7)  Truetrack Test Strp (Glucose blood) .... Use to check blood sugar4-6x daily 8)  Lancets Misc (Lancets) .... Use to check blood sugar 4-6x daily 9)  Novolin 70/30 70-30 % Susp (Insulin isophane & regular) .... Inject into the skin of your abdomen 35 units in the morning and 15 units in the evening. 10)  Cialis 10 Mg Tabs (Tadalafil) .... Take 1 pill  by mouth daily as needed 30 minutes before anticipated sexual activity. don't take with nitrate containing drugs. 11)  Metoprolol Tartrate 25 Mg Tabs (Metoprolol tartrate) .... Take 1 pill by mouth two times a day. 12)  Nicoderm Cq 14 Mg/24hr Pt24 (Nicotine) .... Apply 1 patch on your skin daily. 13)  Ibuprofen 600 Mg Tabs (Ibuprofen) .... Take one tablet every 6 hours as needed for pain. take with meals!   Orders Added: 1)  Radiology other [Radiology Other] 2)  Est. Patient Level III [52841]

## 2010-11-05 NOTE — Progress Notes (Signed)
Summary: rtc/ hla  Phone Note Call from Patient   Summary of Call: pt calls and leaves message that he needs refills, rtc, got voicemail, left message to call clinic, follow prompts to reach triage. pt needs appt. Initial call taken by: Marin Roberts RN,  June 01, 2010 3:33 PM

## 2010-12-14 LAB — GLUCOSE, CAPILLARY: Glucose-Capillary: 190 mg/dL — ABNORMAL HIGH (ref 70–99)

## 2010-12-17 LAB — URINALYSIS, ROUTINE W REFLEX MICROSCOPIC
Bilirubin Urine: NEGATIVE
Glucose, UA: >1000 mg/dL — AB
Hgb urine dipstick: NEGATIVE
Ketones, ur: NEGATIVE mg/dL
Leukocytes, UA: NEGATIVE
Nitrite: NEGATIVE
Protein, ur: NEGATIVE mg/dL
Specific Gravity, Urine: 1.035 — ABNORMAL HIGH (ref 1.005–1.030)
Urobilinogen, UA: 1 mg/dL (ref 0.0–1.0)
pH: 5 (ref 5.0–8.0)

## 2010-12-17 LAB — GLUCOSE, CAPILLARY: Glucose-Capillary: 275 mg/dL — ABNORMAL HIGH (ref 70–99)

## 2010-12-17 LAB — URINE MICROSCOPIC-ADD ON

## 2010-12-22 ENCOUNTER — Other Ambulatory Visit: Payer: Self-pay | Admitting: *Deleted

## 2010-12-22 MED ORDER — INSULIN NPH ISOPHANE & REGULAR (70-30) 100 UNIT/ML ~~LOC~~ SUSP: SUBCUTANEOUS | Status: DC

## 2010-12-30 ENCOUNTER — Other Ambulatory Visit: Payer: Self-pay | Admitting: *Deleted

## 2010-12-30 NOTE — Telephone Encounter (Signed)
Pharmacy states pt has been receiving Humulin 70/30 instead of Novolin 70/30 the last 3 months; can this be changed   And the # of refills?  Thanks

## 2010-12-30 NOTE — Telephone Encounter (Signed)
Will route to PCP 

## 2011-01-05 ENCOUNTER — Telehealth: Payer: Self-pay | Admitting: *Deleted

## 2011-01-06 LAB — BASIC METABOLIC PANEL
BUN: 11 mg/dL (ref 6–23)
BUN: 18 mg/dL (ref 6–23)
Calcium: 8.5 mg/dL (ref 8.4–10.5)
Chloride: 104 mEq/L (ref 96–112)
Chloride: 105 mEq/L (ref 96–112)
Chloride: 110 mEq/L (ref 96–112)
Creatinine, Ser: 0.78 mg/dL (ref 0.4–1.5)
GFR calc non Af Amer: >60 mL/min (ref >60)
Glucose, Bld: 203 mg/dL — ABNORMAL HIGH (ref 70–99)
Potassium: 4.1 mEq/L (ref 3.5–5.1)
Potassium: 4.5 mEq/L (ref 3.5–5.1)
Sodium: 141 mEq/L (ref 135–145)

## 2011-01-06 LAB — CBC
HCT: 42.4 % (ref 39.0–52.0)
Hemoglobin: 12.8 g/dL — ABNORMAL LOW (ref 13.0–17.0)
MCHC: 33.8 g/dL (ref 30.0–36.0)
MCV: 92.7 fL (ref 78.0–100.0)
MCV: 92.9 fL (ref 78.0–100.0)
Platelets: 192 10*3/uL (ref 150–400)
Platelets: 207 10*3/uL (ref 150–400)
Platelets: 229 10*3/uL (ref 150–400)
RDW: 13.1 % (ref 11.5–15.5)
RDW: 13.2 % (ref 11.5–15.5)
WBC: 9.5 10*3/uL (ref 4.0–10.5)

## 2011-01-06 LAB — HEMOGLOBIN A1C
Hgb A1c MFr Bld: 7.8 % — ABNORMAL HIGH (ref 4.6–6.1)
Mean Plasma Glucose: 177 mg/dL

## 2011-01-06 LAB — HEPATIC FUNCTION PANEL
ALT: 25 U/L (ref 0–53)
AST: 20 U/L (ref 0–37)
Total Protein: 6.2 g/dL (ref 6.0–8.3)

## 2011-01-06 LAB — CARDIAC PANEL(CRET KIN+CKTOT+MB+TROPI)
CK, MB: 1.9 ng/mL (ref 0.3–4.0)
Relative Index: INVALID (ref 0.0–2.5)
Total CK: 55 U/L (ref 7–232)
Total CK: 60 U/L (ref 7–232)

## 2011-01-06 LAB — TROPONIN I: Troponin I: 0.03 ng/mL (ref 0.00–0.06)

## 2011-01-06 LAB — GLUCOSE, CAPILLARY
Glucose-Capillary: 109 mg/dL — ABNORMAL HIGH (ref 70–99)
Glucose-Capillary: 130 mg/dL — ABNORMAL HIGH (ref 70–99)
Glucose-Capillary: 148 mg/dL — ABNORMAL HIGH (ref 70–99)

## 2011-01-06 LAB — COMPREHENSIVE METABOLIC PANEL
Albumin: 3 g/dL — ABNORMAL LOW (ref 3.5–5.2)
BUN: 15 mg/dL (ref 6–23)
Calcium: 8.5 mg/dL (ref 8.4–10.5)
Glucose, Bld: 155 mg/dL — ABNORMAL HIGH (ref 70–99)
Sodium: 140 mEq/L (ref 135–145)
Total Protein: 5.5 g/dL — ABNORMAL LOW (ref 6.0–8.3)

## 2011-01-06 LAB — LIPID PANEL
Cholesterol: 124 mg/dL (ref 0–200)
HDL: 59 mg/dL (ref >39)
LDL Cholesterol: 54 mg/dL (ref 0–99)
Triglycerides: 56 mg/dL (ref <150)

## 2011-01-06 LAB — CK TOTAL AND CKMB (NOT AT ARMC): CK, MB: 2.4 ng/mL (ref 0.3–4.0)

## 2011-01-06 NOTE — Telephone Encounter (Signed)
I have called in the refill for the patient at [facility]. Thanks!

## 2011-01-06 NOTE — Telephone Encounter (Signed)
Please change med on medication list.

## 2011-01-06 NOTE — Telephone Encounter (Signed)
Patient was called and updated on his refill.

## 2011-01-13 LAB — GLUCOSE, CAPILLARY: Glucose-Capillary: 246 mg/dL — ABNORMAL HIGH (ref 70–99)

## 2011-01-20 MED ORDER — INSULIN NPH ISOPHANE & REGULAR (70-30) 100 UNIT/ML ~~LOC~~ SUSP: SUBCUTANEOUS | Status: DC

## 2011-02-19 NOTE — Cardiovascular Report (Signed)
[place]  Patient:    Benjamin Baker, Benjamin Baker Visit Number: [id number] MRN: [id number]          Service Type: CAT Location: [id number] 2034 [id number] Attending Physician:  [place], Peter Manning Dictated by:   Peter M. [place], M.D. Proc. Date: 03/16/02 Admit Date:  03/16/2002   CC:         Ladell Pier, M.D.  Gwenith Daily Tyrone Sage, M.D.   Cardiac Catheterization  INDICATION FOR PROCEDURE:  A 55 year old white male who presents with unstable angina.  He has multiple cardiac risk factors including history of diabetes, hypertension, hypercholesterolemia, tobacco abuse, and family history of early coronary disease.  ACCESS:  Via the right femoral artery using the standard Seldinger technique.  EQUIPMENT:  The #6 French 4-cm right and left Judkins catheters, #6 French pigtail catheter, #6 French arterial sheath.  MEDICATIONS:  Local anesthesia with 1% Xylocaine.  CONTRAST:  Omnipaque, 150 cc.  HEMODYNAMIC DATA: 1. Aortic pressure was 128/83 with a mean of 105. 2. Left ventricular pressure was 125 with an EDP of 27 mmHg.  ANGIOGRAPHIC DATA: 1. The left coronary artery arises and distributes normally.  2. The left main coronary is normal.  3. The left anterior descending artery has diffuse atherosclerotic disease    from the proximal to mid vessel up to 70%.  4. The left circumflex coronary artery has a 95% stenosis in the mid vessel.    The first marginal branch has a 30% narrowing proximally.  The second    marginal branch has a 70% stenosis proximally.  The left coronary artery    does supply left-to-right collaterals to the distal right coronary    artery including PDA and posterolateral branches.  5. The right coronary is occluded proximally.  LEFT VENTRICULOGRAM:  Left ventricular angiography performed in the RAO view demonstrates normal left ventricular size.  There is severe hypokinesia in the mid to basal inferior wall.  Overall left  ventricular function is mildly depressed with ejection fraction estimated at 50-55%.  FINAL INTERPRETATION: 1. Severe three-vessel obstructive atherosclerotic coronary artery disease. 2. Mild left ventricular dysfunction.  PLAN: Dictated by:   Peter M. Swaziland, M.D. Attending Physician:  [place], Peter Manning DD:  03/16/02 TD:  03/19/02 Job: [id number]

## 2011-02-19 NOTE — Op Note (Signed)
[place]  Patient:    Benjamin Baker, Benjamin Baker Visit Number: [id number] MRN: [id number]          Service Type: MED Location: 2300 [phone] Attending Physician:  Waldo Laine Dictated by:   Gwenith Daily Tyrone Sage, M.D. Admit Date:  03/16/2002                             Operative Report  PREOPERATIVE DIAGNOSIS:  Coronary occlusive disease with unstable angina.  POSTOPERATIVE DIAGNOSIS:  Coronary occlusive disease with unstable angina.  PROCEDURE:  Coronary artery bypass grafting x3 with the left internal mammary artery to the left anterior descending coronary artery, a right radial artery bypass to the second obtuse marginal coronary artery, and reversed saphenous vein graft to the posterior descending coronary artery.  SURGEON:  Gwenith Daily. Tyrone Sage, M.D.  FIRST ASSISTANT:  Gina H. Collins, P.A.-C.  BRIEF HISTORY:  The patient is a 55 year old male with a long-standing history of smoking and also of several years of diabetes.  Because of recurrent anginal chest pain, he had been seen in the emergency room on at least one occasion with chest pain.  He was referred for a Cardiolite stress test; however, this was cancelled and the patient was referred directly to Dr. Swaziland for a cardiac catheterization.  At the time of catheterization the patient was found to have total occlusion of the right coronary artery, at least 70-80% stenosis of the proximal mid-LAD, a 30% stenosis in a first obtuse marginal, a 95% stenosis in the circumflex coronary artery just past the takeoff of the first obtuse marginal with a very small distal circumflex and a large second obtuse marginal, jeopardized by the greater than 95% stenosis.  Overall ventricular function was preserved but with significant inferior hypokinesis.  With the patients significant three-vessel disease, coronary artery bypass grafting was recommended.  The patient agreed and signed informed  consent.  DESCRIPTION OF PROCEDURE:  With Swan-Ganz and arterial line monitors in place, the patient underwent general endotracheal anesthesia without incident. Because the patient is left-handed, it was decided to use the right radial artery.  He had an intact palmar arch.  Initially the right arm was prepped. The incision was made over the distal right radial artery, which had a good pulse when examined.  Doppler flow remained in the palmar arch with radial occlusion.  The incision was carried along the radial artery, carefully dissecting out the vessel and retracting the brachioradialis muscle laterally. With the radial artery exposed, small ligaclips were used to clip and then divide the branches along the radial artery, preserving the venous vessels along with the artery.  The distal artery was divided and hydrostatically dilated with papaverine and heparinized saline.  The vessel was then removed from the arm, doubly ligating both the proximal and distal ends.  The radial artery appeared to be of excellent quality and size.  The arm was then closed with a running 2-0 Vicryl suture and skin staples in the skin edges.  Dry dressing was applied, and the patients arm was tucked.  The remainder of the patient was then prepped and draped in the usual sterile manner.  A segment of vein was harvested from the right lower extremity.  Median sternotomy was performed.  The left internal mammary artery was dissected down as a pedicle artery graft and the distal artery was divided, had good free flow.  The pericardium was opened.  Overall ventricular  function appeared preserved with some evidence of hypokinesis on the inferior wall.  The patient did have a somewhat enlarged heart and a significant amount of fatty epicardium.  The patient also had a relatively small aorta for his size of greater than 230 pounds.  The patient was systemically heparinized.  The ascending aorta and the right atrium  were cannulated and the aortic root vent cardioplegia needle was introduced into the ascending aorta.  The patient was placed on cardiopulmonary bypass, 2.4 L/min. per sq. m.  Sites for anastomosis were selected and dissected out of the epicardium, and his body temperature was cooled to 30 degrees.  The aortic crossclamp was applied, 500 cc of cold blood potassium cardioplegia was administered with rapid diastolic arrest of the heart, and myocardial septal temperature was monitored throughout the crossclamp period.  Attention was turned first to the distal right coronary artery, which was severely diseased and was not suitable to be opened.  The proximal posterior descending vessel was smaller but was relatively free of disease.  The vessel was opened and admitted a 1.5 mm probe proximally, a 1 mm probe distally.  Using a running 7-0 Prolene, distal anastomosis was performed.  Attention was then turned to the second obtuse marginal coronary artery, which was opened and admitted a 1.5 mm probe.  Using a running 8-0 Prolene, the right radial artery was anastomosed to the second obtuse marginal. The vessel flushed easily.  Attention was then turned to the left anterior descending coronary artery, which was opened in the distal third of the vessel.  The proximal two-thirds were intramyocardial.  The LAD was relatively small.  A 1 mm probe did pass distally and a 1.5 mm probe proximally, and using running 8-0 Prolene, the left internal mammary artery was anastomosed to the left anterior descending coronary artery.  With release of the Edwards bulldog on the mammary artery, there was appropriate rise in myocardial septal temperature.  The aortic crossclamp was removed, total crossclamp time of 54 minutes.  A partial occlusion clamp was placed on the ascending aorta.  Two punch aortotomies were performed.  The radial artery was of sufficient size to anastomose directly to the ascending aorta with  a running 7-0 Prolene.  The right vein graft was also anastomosed to the ascending aorta.  There was free backbleeding from the radial artery.  Grafts  were de-aired and the partial occlusion clamp was removed.  Sites of anastomosis were inspected and were free of bleeding.  The patient was then ventilated and weaned from cardiopulmonary bypass.  He remained hemodynamically stable.  He was decannulated in the usual fashion.  Protamine sulfate was administered with the operative field hemostatic.  Two atrial and two ventricular pacing wires were applied and graft markers applied.  A left pleural tube and two mediastinal tubes were left in place.  The sternum was closed with #6 stainless steel wire.  Fascia closed with interrupted 0 Vicryl, running 3-0 Vicryl in the subcutaneous tissue, and 4-0 subcuticular stitch in the skin edges.  Dry dressings were applied.  Sponge and needle count was reported as correct at completion of the procedure.  The patient tolerated the procedure without obvious complication and was transferred to the surgical intensive care unit for further postoperative care. Dictated by:   Gwenith Daily Tyrone Sage, M.D. Attending Physician:  Waldo Laine DD:  03/20/02 TD:  03/21/02 Job: [id number]

## 2011-02-19 NOTE — H&P (Signed)
[place]  Patient:    Benjamin Baker, SCULL Visit Number: [id number] MRN: [id number]          Service Type: MED Location: 2300 2309 [id number] Attending Physician:  Waldo Laine Dictated by:   Peter M. [place], M.D. Admit Date:  03/16/2002   CC:         Ladell Pier, M.D. at [facility]   History and Physical  DATE OF BIRTH:  18-Aug-1956  CHIEF COMPLAINT:  Chest pain.  HISTORY OF PRESENT ILLNESS:  The patient is a 55 year old white male who has multiple cardiac risk factors who presents with complaints of chest pain.  He states he has been having chest pain for the past month.  This initially began beneath his left arm but then began radiating to his left chest.  This occurs every day while he is at work and also has occurred some at night when he gets home after work and is sitting watching T.V.  It seems to be worse with activity.  Initially he thought this was a pulled muscle, but given the fact that his pain has not resolved he has sought further evaluation.  He has also had some pain radiating to his upper back.  He denies any shortness of breath, nausea, vomiting, or diaphoresis.  Approximately one week ago he presented to the emergency room with these symptoms and apparently lab work at that time was unremarkable, and he was discharged.  He has continued to have progressive chest pain however, stating that it is getting worse over the past week.  The patient has multiple cardiac risk factors including history of diabetes, hypercholesterolemia, hypertension, history of tobacco abuse, and family history of early coronary disease.  PAST MEDICAL HISTORY: 1. Diabetes mellitus, diagnosed February 2002; this is type 2. 2. Hypertension. 3. Hypercholesterolemia.  ALLERGIES:  No known allergies.  PAST SURGICAL HISTORY:  No prior surgery.  CURRENT MEDICATIONS: 1. Lotensin 10 mg per day. 2. Glucotrol XL 10  mg b.i.d. 3. Metoprolol 50 mg b.i.d. 4. Aspirin 81 mg per day. 5. Zocor 20 mg per day.  SOCIAL HISTORY:  The patient works at a [employer].  He is married, he has four children.  He does not do any regular exercise but his work requires him to do a lot of lifting.  He smokes one pack per day currently.  He denies alcohol use.  FAMILY HISTORY:  Father had a myocardial infarction and is status post coronary artery bypass surgery; he has a history of hypertension.  Mother is age 29 in good health.  He has one brother in good health.  REVIEW OF SYSTEMS:  The patient denies any TIA or stroke.  He has no claudication symptoms.  Denies any edema, orthopnea, or PND.  He has had no recent bowel or bladder complaints.  All other review of systems are negative.  PHYSICAL EXAMINATION:  GENERAL:  The patient is an overweight white male in no apparent distress.  VITAL SIGNS:  Weight 223, blood pressure 118/78, pulse 74 and regular, respirations 20 and unlabored.  HEENT:  Pupils are equal, round and reactive to light and accommodation. Extraocular movements are full.  Oropharynx is clear.  NECK:  Supple without JVD, adenopathy, thyromegaly, or bruits.  LUNGS:  Clear to auscultation and percussion.  CARDIAC:  Reveals regular rate and rhythm, normal S1 and S2, without gallops, murmurs, or clicks.  MUSCULOSKELETAL:  There is no chest wall tenderness to  palpation or muscular tenderness in his left arm.  ABDOMEN:  Obese, soft, and nontender.  He has no masses or bruits.  There is no hepatosplenomegaly.  EXTREMITIES:  Femoral and pedal pulses are 2+ and symmetric.  He has no edema or phlebitis.  SKIN:  Warm and dry.  NEUROLOGIC:  Nonfocal.  LABORATORY DATA:  ECG demonstrates normal sinus rhythm.  There is a small Q wave in lead III and aVF.  There are mild T wave inversions in leads I and aVL.  IMPRESSION: 1. Refractory chest pain.  Concern about unstable angina pectoris in  this    patient with multiple cardiac risk factors. 2. Diabetes mellitus type 2. 3. Tobacco abuse. 4. Hypertension. 5. Hypercholesterolemia. 6. Family history of early coronary disease. 7. Obesity.  PLAN:  The patient will be admitted for coronary angiography with further therapy pending these results. Dictated by:   Peter M. [place]azil[place], M.D. Attending Physician:  Waldo Laine DD:  03/15/02 TD:  03/17/02 Job: [id number]

## 2011-02-19 NOTE — Discharge Summary (Signed)
[place]  Patient:    Benjamin Baker, Benjamin Baker Visit Number: [id number] MRN: [id number]          Service Type: MED Location: 2000 2013 01 Attending Physician:  Waldo Laine Dictated by:   Dominica Severin, P.A. Admit Date:  03/16/2002 Discharge Date: 03/24/2002   CC:         Peter M. Swaziland, M.D.  Ladell Pier, M.D.   Discharge Summary  DATE OF BIRTH: Sep 11, 1956  PRIMARY ADMISSION DIAGNOSIS: Unstable angina.  SECONDARY DIAGNOSES/PAST MEDICAL HISTORY:  1. Diabetes mellitus, diagnosed in February 2002.  2. Hypertension.  3. Hyperlipidemia.  4. Nicotine habituation.  NEW DIAGNOSIS/DISCHARGE DIAGNOSIS: Three-vessel coronary artery disease, status post coronary artery bypass graft surgery.  PROCEDURES:  1. Cardiac catheterization done on March 16, 2002.  2. Pre coronary artery bypass graft surgery Doppler evaluation done on     March 16, 2002.  3. Coronary artery bypass graft surgery x3 done on March 19, 2002 with the     following grafts placed: Left internal mammary artery to the left     anterior descending artery, right radial artery to circumflex branch,     and saphenous vein graft to the posterior descending branch.  HOSPITAL COURSE: This patient is a 55 year old male with a long-standing history of smoking as well as several years of diabetes, who presented with recurrent anginal chest pain.  He had been seen in the emergency room on at least one occasion with chest pain.  He was referred for a Cardiolite stress test but this was cancelled and the patient was referred directly to Dr. Swaziland for a cardiac catheterization.  This revealed three-vessel coronary artery disease.  His ventricular function was preserved but with significant inferior hypokinesis.  Secondary to the patients significant three-vessel disease the patient was referred for coronary revascularization.  He was seen by Dr. Tyrone Sage and it was agreed that the  patient would benefit from coronary artery bypass graft surgery, which he underwent on March 19, 2002.  The patient was made aware of the indications, risks and benefits of the procedure prior to the procedure and consent was signed.  The patient tolerated the procedure well.  Later that evening he was extubated.  He had excellent urine output, and was not bleeding.  He was started on Imdur for his radial artery harvest on postoperative day one.  He continued to show signs of progression.  He was having room air oxygen saturation greater than 90% and he had excellent urine output throughout his stay.  He was tolerating a regular diet.  His incisions remained clean and dry without any signs of infection or complications.  He was ambulating without difficulty with cardiac rehab phase I.  Smoking cessation consult was also obtained on March 20, 2002 for further enforcement of discontinued smoking after he was discharged.  It is anticipated that he will be discharged home in stable condition on March 24, 2002.  DISCHARGE CONDITION: Stable.  DISCHARGE MEDICATIONS:  1. Zocor 20 mg q.d.  2. Aspirin 325 mg q.d.  3. Lasix 40 mg q.d. x7 days.  4. Potassium chloride 20 mEq q.d. x7 days.  5. Folic acid 1 mg q.d.  6. Glucotrol XL 10 mg q.d., which is his previous home dose.  7. Lopressor 50 mg tablet b.i.d., which is also his previous home dose.  8. Altace 5 mg q.d.  9. Tylox one to two tablets q.4h to q.6h as needed for pain.  DISCHARGE  ACTIVITY: The patient was instructed not to do any driving, heavy lifting, or strenuous activity.  He is to continue to walk daily and continue his breathing exercises.  He is also instructed to discontinue smoking.  DISCHARGE DIET: He is to follow a heart healthy diabetic diet.  WOUND CARE: He was told he may shower.  FOLLOW-UP: He is to notify the office of any increased temperature greater than 101 degrees or if there is any increased redness, swelling, or  drainage from any of his incisions.  He is to see Dr. Swaziland in two weeks - he is to call to set up that appointment.  He is to obtain a chest x-ray at that time and is to bring that chest x-ray with him to his appointment with Dr. Tyrone Sage on Thursday, April 19, 2002, at 9:30 a.m.Dictated by:   Dominica Severin, P.A.  Attending Physician:  Waldo Laine DD:  03/23/02 TD:  03/26/02 Job: [id number]

## 2011-03-04 ENCOUNTER — Other Ambulatory Visit: Payer: Self-pay | Admitting: *Deleted

## 2011-03-05 ENCOUNTER — Other Ambulatory Visit: Payer: Self-pay | Admitting: *Deleted

## 2011-03-05 MED ORDER — FAMOTIDINE 20 MG PO TABS: 20.0000 mg | ORAL_TABLET | Freq: Two times a day (BID) | ORAL | Status: DC

## 2011-03-05 MED ORDER — INSULIN NPH ISOPHANE & REGULAR (70-30) 100 UNIT/ML ~~LOC~~ SUSP: SUBCUTANEOUS | Status: DC

## 2011-03-05 MED ORDER — RANITIDINE HCL 150 MG PO CAPS: 150.0000 mg | ORAL_CAPSULE | Freq: Two times a day (BID) | ORAL | Status: DC

## 2011-03-05 MED ORDER — METFORMIN HCL 1000 MG PO TABS: 1000.0000 mg | ORAL_TABLET | Freq: Two times a day (BID) | ORAL | Status: DC

## 2011-03-05 NOTE — Telephone Encounter (Signed)
When i called the pharm to give the refills they state they do not carry ranitidine anymore, it would need to be changed to generic pepcid, is this acceptable? If so please change the med list

## 2011-03-05 NOTE — Telephone Encounter (Signed)
Called.

## 2011-08-02 ENCOUNTER — Encounter: Payer: Self-pay | Admitting: Internal Medicine

## 2011-08-23 ENCOUNTER — Ambulatory Visit (INDEPENDENT_AMBULATORY_CARE_PROVIDER_SITE_OTHER): Payer: Self-pay | Admitting: Internal Medicine

## 2011-08-23 VITALS — BP 140/89 | HR 83 | Temp 97.5°F | Wt 224.4 lb

## 2011-08-23 DIAGNOSIS — I10 Essential (primary) hypertension: Secondary | ICD-10-CM

## 2011-08-23 DIAGNOSIS — Z951 Presence of aortocoronary bypass graft: Secondary | ICD-10-CM

## 2011-08-23 DIAGNOSIS — E785 Hyperlipidemia, unspecified: Secondary | ICD-10-CM

## 2011-08-23 DIAGNOSIS — E119 Type 2 diabetes mellitus without complications: Secondary | ICD-10-CM

## 2011-08-23 DIAGNOSIS — Z0000 Encounter for general adult medical examination without abnormal findings: Secondary | ICD-10-CM

## 2011-08-23 DIAGNOSIS — Z23 Encounter for immunization: Secondary | ICD-10-CM

## 2011-08-23 DIAGNOSIS — K219 Gastro-esophageal reflux disease without esophagitis: Secondary | ICD-10-CM

## 2011-08-23 LAB — CBC WITH DIFFERENTIAL/PLATELET
Eosinophils Absolute: 0.4 10*3/uL (ref 0.0–0.7)
Lymphocytes Relative: 34 % (ref 12–46)
Lymphs Abs: 3.7 10*3/uL (ref 0.7–4.0)
Neutrophils Relative %: 57 % (ref 43–77)
Platelets: 254 10*3/uL (ref 150–400)
RBC: 4.63 MIL/uL (ref 4.22–5.81)
WBC: 10.8 10*3/uL — ABNORMAL HIGH (ref 4.0–10.5)

## 2011-08-23 MED ORDER — LISINOPRIL 5 MG PO TABS: 5.0000 mg | ORAL_TABLET | Freq: Every day | ORAL | Status: DC

## 2011-08-23 MED ORDER — INSULIN NPH ISOPHANE & REGULAR (70-30) 100 UNIT/ML ~~LOC~~ SUSP: SUBCUTANEOUS | Status: DC

## 2011-08-23 MED ORDER — GLUCOSE BLOOD VI STRP: ORAL_STRIP | Status: DC

## 2011-08-23 MED ORDER — FAMOTIDINE 20 MG PO TABS: 20.0000 mg | ORAL_TABLET | Freq: Two times a day (BID) | ORAL | Status: DC

## 2011-08-23 MED ORDER — METFORMIN HCL 1000 MG PO TABS: 1000.0000 mg | ORAL_TABLET | Freq: Two times a day (BID) | ORAL | Status: DC

## 2011-08-23 NOTE — Patient Instructions (Addendum)
Please schedule a follow up appointment in 1-2 months. Please take your medicines as precribed. Please get your medication bottles with your next appointment.

## 2011-08-23 NOTE — Progress Notes (Signed)
  Subjective:    Patient ID: Benjamin Baker, male    DOB: [birth year], 55 y.o.   MRN: [id number]  HPI: 55 year old man with past medical history significant for Type 2 diabetes mellitus, coronary artery disease status post CABG comes to the clinic for a followup visit almost after a year.  He was last seen in clinic in December 2011 and returns in today to get his scripts refilled. He states that his orange-card was about to expire and therefore came for a checkup before its expiration. He is currently working in third shift but not making enough money. He has been very stressed lately because of no stable job since 2009.   He states that he has not been checking his blood sugars regularly but when he checks the usually run in the 150s. He states that his old meter broke and he got a new meter yesterday , checked his fasting blood sugar that was 220. He states that he checks his BP regularly and it usually runs in 140-150's systolic.  He states that his father was diagnosed with colon cancer about 5 years ago and has got a colostomy bag placed.   Denies any hypoglycemic events, abdominal pain, nausea or vomiting, or alteration in bowel or bladder habits or blood in his stools.       Review of Systems  Constitutional: Negative for fever, diaphoresis and fatigue.  HENT: Negative for congestion, postnasal drip and tinnitus.   Eyes: Negative for visual disturbance.  Respiratory: Negative for cough, choking, shortness of breath, wheezing and stridor.   Gastrointestinal: Negative for vomiting, abdominal pain, constipation and blood in stool.  Genitourinary: Negative for dysuria and hematuria.  Musculoskeletal: Negative for arthralgias.  Neurological: Negative for dizziness, light-headedness and headaches.  Hematological: Negative for adenopathy.  Psychiatric/Behavioral: Negative for agitation.       Objective:   Physical Exam  Constitutional: He is oriented to person, place, and time. He  appears well-developed and well-nourished. No distress.  HENT:  Head: Normocephalic and atraumatic.  Mouth/Throat: No oropharyngeal exudate.  Eyes: Conjunctivae and EOM are normal. Pupils are equal, round, and reactive to light. Right eye exhibits no discharge. Left eye exhibits no discharge. No scleral icterus.  Neck: Normal range of motion. Neck supple. No JVD present. No tracheal deviation present. No thyromegaly present.  Cardiovascular: Normal rate, regular rhythm, normal heart sounds and intact distal pulses.  Exam reveals no gallop and no friction rub.   No murmur heard. Pulmonary/Chest: Effort normal and breath sounds normal. No stridor. No respiratory distress. He has no wheezes. He has no rales. He exhibits no tenderness.  Abdominal: Soft. Bowel sounds are normal. He exhibits no distension and no mass. There is no tenderness. There is no rebound and no guarding.  Musculoskeletal: Normal range of motion. He exhibits no edema and no tenderness.  Lymphadenopathy:    He has no cervical adenopathy.  Neurological: He is alert and oriented to person, place, and time. He has normal reflexes. He displays normal reflexes. No cranial nerve deficit. He exhibits normal muscle tone. Coordination normal.  Skin: Skin is warm. He is not diaphoretic.          Assessment & Plan:

## 2011-08-24 LAB — COMPLETE METABOLIC PANEL WITH GFR
Albumin: 4 g/dL (ref 3.5–5.2)
CO2: 23 mEq/L (ref 19–32)
GFR, Est African American: >89 mL/min
GFR, Est Non African American: >89 mL/min
Glucose, Bld: 76 mg/dL (ref 70–99)
Potassium: 3.9 mEq/L (ref 3.5–5.3)
Sodium: 144 mEq/L (ref 135–145)
Total Protein: 6.2 g/dL (ref 6.0–8.3)

## 2011-08-24 LAB — MICROALBUMIN / CREATININE URINE RATIO
Creatinine, Urine: 266.8 mg/dL
Microalb, Ur: 3.98 mg/dL — ABNORMAL HIGH (ref 0.00–1.89)

## 2011-08-25 NOTE — Assessment & Plan Note (Signed)
Lab Results  Component Value Date   HGBA1C 8.7 08/23/2011   HGBA1C 9.1 09/07/2010   CREATININE 0.71 08/23/2011   CREATININE 0.86 09/07/2010   MICROALBUR 3.98* 08/23/2011   MICRALBCREAT 14.9 08/23/2011   CHOL 220* 08/23/2011   HDL 31* 08/23/2011   TRIG 782* 08/23/2011    Last eye exam and foot exam: Foot exam: We did a foot exam with this visit.   Assessment: Diabetes control: controlled Progress toward goals: unchanged Barriers to meeting goals: financial need  Plan:He did not get his glucometer or reading with today's appointment. He has not been checking his CBG's regularly. Therefore, I would continue him on current regimen. Diabetes treatment: continue current medications Refer to: none Instruction/counseling given: reminded to get eye exam, reminded to bring blood glucose meter & log to each visit, reminded to bring medications to each visit, discussed foot care, discussed the need for weight loss and discussed diet

## 2011-08-29 DIAGNOSIS — Z0000 Encounter for general adult medical examination without abnormal findings: Secondary | ICD-10-CM | POA: Insufficient documentation

## 2011-08-29 NOTE — Assessment & Plan Note (Addendum)
He was given a flu- shot today.  He was given stool cards today.Given his  family history of colon cancer, he is at high risk but would refer him for colonoscopy once his orange card will be renewed.

## 2011-08-29 NOTE — Assessment & Plan Note (Signed)
Lab Results  Component Value Date   NA 144 08/23/2011   K 3.9 08/23/2011   CL 110 08/23/2011   CO2 23 08/23/2011   BUN 12 08/23/2011   CREATININE 0.71 08/23/2011   CREATININE 0.86 09/07/2010    BP Readings from Last 3 Encounters:  08/23/11 140/89  09/23/10 131/87  09/07/10 138/83    Assessment: He states that his BP runs in systolic 140-150's at home. Hypertension control:  controlled  Progress toward goals:  unchanged Barriers to meeting goals:  no barriers identified  Plan: Hypertension treatment:  Start him on a low dose lisinopril( ACE- I will also help with its renoprotective effects in this patient with  diabetes).

## 2011-08-29 NOTE — Assessment & Plan Note (Signed)
He states that he was on a medicine for his cholesterol but has not taken it for long time. Check lipid panel-- Based on results, will start him on statin.

## 2011-08-29 NOTE — Assessment & Plan Note (Addendum)
Stable.  -Continue daily ASA.  -Will also start him on statin and BB- blocker. -He was counseled on tobacco cessation.

## 2011-08-30 ENCOUNTER — Encounter: Payer: Self-pay | Admitting: Internal Medicine

## 2011-08-30 NOTE — Progress Notes (Signed)
I tried calling the patient multiple times to discuss his lab results and start him on statin and BB blocker but he is not answering his phone. Would address that with his follow up visit.  Thanks, [employer]

## 2011-09-30 ENCOUNTER — Encounter (HOSPITAL_COMMUNITY): Payer: Self-pay | Admitting: *Deleted

## 2011-09-30 ENCOUNTER — Emergency Department (INDEPENDENT_AMBULATORY_CARE_PROVIDER_SITE_OTHER)
Admission: EM | Admit: 2011-09-30 | Discharge: 2011-09-30 | Disposition: A | Discharge status: Home / Self Care | Payer: Self-pay | Source: Home / Self Care | Attending: Family Medicine | Admitting: Family Medicine

## 2011-09-30 DIAGNOSIS — R05 Cough: Secondary | ICD-10-CM

## 2011-09-30 DIAGNOSIS — J310 Chronic rhinitis: Secondary | ICD-10-CM

## 2011-09-30 HISTORY — DX: Gastro-esophageal reflux disease without esophagitis: K21.9

## 2011-09-30 HISTORY — DX: Atherosclerotic heart disease of native coronary artery without angina pectoris: I25.10

## 2011-09-30 MED ORDER — GUAIFENESIN-CODEINE 100-10 MG/5ML PO SYRP: 5.0000 mL | ORAL_SOLUTION | Freq: Four times a day (QID) | ORAL | Status: AC | PRN

## 2011-09-30 MED ORDER — FLUTICASONE PROPIONATE 50 MCG/ACT NA SUSP: 2.0000 | Freq: Every day | NASAL | Status: DC

## 2011-09-30 NOTE — ED Provider Notes (Signed)
History     CSN: [id number]  Arrival date & time 09/30/11  1252   First MD Initiated Contact with Patient 09/30/11 1506      Chief Complaint  Patient presents with  . Cough    (Consider location/radiation/quality/duration/timing/severity/associated sxs/prior treatment) HPI Comments: Benjamin Baker presents for evaluation of persistent cough, laryngitis, and nasal congestion. He reports onset of symptoms 3 weeks ago but denies any fever, body aches, or other symptoms. He denies any hx of allergies. He continues to smoke 1/2 pack of cigarettes daily.   Patient is a 55 y.o. male presenting with cough. The history is provided by the patient.  Cough This is a new problem. The current episode started more than 1 week ago. The problem occurs constantly. The problem has not changed since onset.The cough is non-productive. There has been no fever. Associated symptoms include ear congestion and myalgias. Pertinent negatives include no sore throat. He has tried cough syrup for the symptoms. The treatment provided no relief. He is a smoker.    Past Medical History  Diagnosis Date  . Diabetes mellitus   . GERD (gastroesophageal reflux disease)   . CAD (coronary artery disease)     Past Surgical History  Procedure Date  . Coronary artery bypass graft     History reviewed. No pertinent family history.  History  Substance Use Topics  . Smoking status: Current Everyday Smoker    Types: Cigarettes  . Smokeless tobacco: Not on file  . Alcohol Use: No      Review of Systems  Constitutional: Negative.   HENT: Positive for congestion, voice change and sinus pressure. Negative for sore throat.   Eyes: Negative.   Respiratory: Positive for cough.   Gastrointestinal: Negative.   Genitourinary: Negative.   Musculoskeletal: Positive for myalgias.  Skin: Negative.     Allergies  Review of patient's allergies indicates no known allergies.  Home Medications   Current Outpatient Rx  Name  Route Sig Dispense Refill  . ASPIRIN 81 MG PO TABS Oral Take 81 mg by mouth daily.      Marland Kitchen FAMOTIDINE 20 MG PO TABS Oral Take 1 tablet (20 mg total) by mouth 2 (two) times daily. 60 tablet 3  . FLUTICASONE PROPIONATE 50 MCG/ACT NA SUSP Nasal Place 2 sprays into the nose daily. 16 g 2  . GLUCOSE BLOOD VI STRP  Use as instructed 100 each 3  . GUAIFENESIN-CODEINE 100-10 MG/5ML PO SYRP Oral Take 5 mLs by mouth every 6 (six) hours as needed for cough or congestion. 120 mL 0  . INSULIN ISOPHANE & REGULAR (70-30) 100 UNIT/ML [place] SUSP  Inject into the skin of your abdomen 35 units in the morning and 15 units in the evening. 10 mL 6  . LISINOPRIL 5 MG PO TABS Oral Take 1 tablet (5 mg total) by mouth daily. 30 tablet 3  . METFORMIN HCL 1000 MG PO TABS Oral Take 1 tablet (1,000 mg total) by mouth 2 (two) times daily with a meal. 180 tablet 2  . PRAVASTATIN SODIUM 40 MG PO TABS Oral Take 40 mg by mouth daily.        BP 137/73  Pulse 70  Temp(Src) 98 F (36.7 C) (Oral)  Resp 18  SpO2 100%  Physical Exam  Nursing note and vitals reviewed. Constitutional: He is oriented to person, place, and time. He appears well-developed and well-nourished.  HENT:  Head: Normocephalic and atraumatic.  Right Ear: Tympanic membrane normal.  Left Ear: Tympanic membrane normal.  Mouth/Throat: Uvula is midline, oropharynx is clear and moist and mucous membranes are normal.  Eyes: EOM are normal.  Neck: Normal range of motion.  Cardiovascular: Normal rate and regular rhythm.   Pulmonary/Chest: Effort normal and breath sounds normal. He has no wheezes.  Musculoskeletal: Normal range of motion.  Neurological: He is alert and oriented to person, place, and time.  Skin: Skin is warm and dry.  Psychiatric: His behavior is normal.    ED Course  Procedures (including critical care time)  Labs Reviewed - No data to display No results found.   1. Rhinitis   2. Cough       MDM  Treated with fluticasone and  guaifenesin AC        Richardo Priest, MD 09/30/11 1606

## 2011-09-30 NOTE — ED Notes (Signed)
Benjamin Baker  Is  A  Diabetic  He  Reports  Symptoms  Of  Cough /  Congested       X  2-3  Weeks  He  Reports    Symptoms  Of  Being  Hoarse  As  Well  -  The  Hoarseness  Started  yest        He  Reports  A  Productive  Cough  As  Well

## 2011-10-27 ENCOUNTER — Other Ambulatory Visit: Payer: Self-pay | Admitting: *Deleted

## 2011-10-27 DIAGNOSIS — E119 Type 2 diabetes mellitus without complications: Secondary | ICD-10-CM

## 2011-10-27 DIAGNOSIS — K219 Gastro-esophageal reflux disease without esophagitis: Secondary | ICD-10-CM

## 2011-10-27 MED ORDER — METFORMIN HCL 1000 MG PO TABS: 1000.0000 mg | ORAL_TABLET | Freq: Two times a day (BID) | ORAL | Status: DC

## 2011-10-27 MED ORDER — FAMOTIDINE 20 MG PO TABS: 20.0000 mg | ORAL_TABLET | Freq: Two times a day (BID) | ORAL | Status: DC

## 2011-10-27 NOTE — Telephone Encounter (Signed)
Rx called in 

## 2011-11-01 ENCOUNTER — Encounter: Payer: Self-pay | Admitting: Internal Medicine

## 2011-11-25 ENCOUNTER — Encounter: Payer: Self-pay | Admitting: Internal Medicine

## 2012-03-01 ENCOUNTER — Other Ambulatory Visit: Payer: Self-pay | Admitting: *Deleted

## 2012-03-01 DIAGNOSIS — E119 Type 2 diabetes mellitus without complications: Secondary | ICD-10-CM

## 2012-03-01 MED ORDER — INSULIN NPH ISOPHANE & REGULAR (70-30) 100 UNIT/ML ~~LOC~~ SUSP: SUBCUTANEOUS | Status: DC

## 2012-03-01 NOTE — Telephone Encounter (Signed)
Pt # [id number]

## 2012-03-02 NOTE — Telephone Encounter (Signed)
Rx called in 

## 2012-03-29 ENCOUNTER — Encounter (HOSPITAL_COMMUNITY): Payer: Self-pay | Admitting: Physical Medicine and Rehabilitation

## 2012-03-29 ENCOUNTER — Emergency Department (HOSPITAL_COMMUNITY)
Admission: EM | Admit: 2012-03-29 | Discharge: 2012-03-30 | Disposition: A | Discharge status: Home / Self Care | Payer: Self-pay | Attending: Emergency Medicine | Admitting: Emergency Medicine

## 2012-03-29 DIAGNOSIS — L03011 Cellulitis of right finger: Secondary | ICD-10-CM

## 2012-03-29 DIAGNOSIS — K219 Gastro-esophageal reflux disease without esophagitis: Secondary | ICD-10-CM | POA: Insufficient documentation

## 2012-03-29 DIAGNOSIS — L03019 Cellulitis of unspecified finger: Secondary | ICD-10-CM | POA: Insufficient documentation

## 2012-03-29 DIAGNOSIS — E119 Type 2 diabetes mellitus without complications: Secondary | ICD-10-CM | POA: Insufficient documentation

## 2012-03-29 DIAGNOSIS — M79609 Pain in unspecified limb: Secondary | ICD-10-CM | POA: Insufficient documentation

## 2012-03-29 DIAGNOSIS — I10 Essential (primary) hypertension: Secondary | ICD-10-CM | POA: Insufficient documentation

## 2012-03-29 DIAGNOSIS — I2510 Atherosclerotic heart disease of native coronary artery without angina pectoris: Secondary | ICD-10-CM | POA: Insufficient documentation

## 2012-03-29 DIAGNOSIS — R609 Edema, unspecified: Secondary | ICD-10-CM | POA: Insufficient documentation

## 2012-03-29 DIAGNOSIS — Z794 Long term (current) use of insulin: Secondary | ICD-10-CM | POA: Insufficient documentation

## 2012-03-29 DIAGNOSIS — Z951 Presence of aortocoronary bypass graft: Secondary | ICD-10-CM | POA: Insufficient documentation

## 2012-03-29 DIAGNOSIS — F17200 Nicotine dependence, unspecified, uncomplicated: Secondary | ICD-10-CM | POA: Insufficient documentation

## 2012-03-29 DIAGNOSIS — Z7982 Long term (current) use of aspirin: Secondary | ICD-10-CM | POA: Insufficient documentation

## 2012-03-29 HISTORY — DX: Essential (primary) hypertension: I10

## 2012-03-29 NOTE — ED Notes (Signed)
Pt presents to department for evaluation of R little finger pain and swelling. Ongoing x1 week. States "I think I have an ingrown fingernail." no drainage noted from site. 7/10 pain upon arrival. No signs of distress noted.

## 2012-03-30 MED ORDER — DOXYCYCLINE HYCLATE 100 MG PO CAPS: 100.0000 mg | ORAL_CAPSULE | Freq: Two times a day (BID) | ORAL | Status: AC

## 2012-03-30 MED ORDER — HYDROCODONE-ACETAMINOPHEN 5-325 MG PO TABS: 1.0000 | ORAL_TABLET | ORAL | Status: AC | PRN

## 2012-03-30 NOTE — ED Provider Notes (Signed)
History     CSN: [id number]  Arrival date & time 03/29/12  2303   First MD Initiated Contact with Patient 03/29/12 2328      Chief Complaint  Patient presents with  . Edema  . Hand Pain    (Consider location/radiation/quality/duration/timing/severity/associated sxs/prior treatment) HPI Comments: Patient here with swelling and pain to his cuticle area of his right 5th finger - states that he was cutting his nails and he thinks that he cut this one too close - states has tried to get the pus out with a needle but no luck - denies fever, chills, pain to the hand, nausea or vomiting.  Patient is a 56 y.o. male presenting with hand pain. The history is provided by the patient. No language interpreter was used.  Hand Pain This is a new problem. The current episode started today. The problem occurs constantly. The problem has been unchanged. Associated symptoms include arthralgias and joint swelling. Pertinent negatives include no abdominal pain, anorexia, change in bowel habit, chest pain, chills, congestion, coughing, diaphoresis, fatigue, fever, headaches, myalgias, nausea, neck pain, numbness, rash, sore throat, swollen glands, urinary symptoms, vertigo, visual change, vomiting or weakness. The symptoms are aggravated by bending. He has tried nothing for the symptoms. The treatment provided no relief.    Past Medical History  Diagnosis Date  . Diabetes mellitus   . GERD (gastroesophageal reflux disease)   . CAD (coronary artery disease)   . Hypertension     Past Surgical History  Procedure Date  . Coronary artery bypass graft     History reviewed. No pertinent family history.  History  Substance Use Topics  . Smoking status: Current Everyday Smoker    Types: Cigarettes  . Smokeless tobacco: Not on file  . Alcohol Use: No      Review of Systems  Constitutional: Negative for fever, chills, diaphoresis and fatigue.  HENT: Negative for congestion, sore throat and neck pain.    Respiratory: Negative for cough.   Cardiovascular: Negative for chest pain.  Gastrointestinal: Negative for nausea, vomiting, abdominal pain, anorexia and change in bowel habit.  Musculoskeletal: Positive for joint swelling and arthralgias. Negative for myalgias.  Skin: Negative for rash.  Neurological: Negative for vertigo, weakness, numbness and headaches.  All other systems reviewed and are negative.    Allergies  Review of patient's allergies indicates no known allergies.  Home Medications   Current Outpatient Rx  Name Route Sig Dispense Refill  . ASPIRIN 81 MG PO TABS Oral Take 81 mg by mouth daily.      Marland Kitchen FAMOTIDINE 20 MG PO TABS Oral Take 1 tablet (20 mg total) by mouth 2 (two) times daily. 60 tablet 3  . INSULIN ASPART PROT & ASPART (70-30) 100 UNIT/ML [place] SUSP Subcutaneous Inject 15-30 Units into the skin 2 (two) times daily. 30 units in the morning and 15 units in the evening    . METFORMIN HCL 1000 MG PO TABS Oral Take 1 tablet (1,000 mg total) by mouth 2 (two) times daily with a meal. 180 tablet 2    BP 111/72  Pulse 86  Temp 98.7 F (37.1 C) (Oral)  Resp 18  SpO2 99%  Physical Exam  Nursing note and vitals reviewed. Constitutional: He is oriented to person, place, and time. He appears well-developed and well-nourished. No distress.  HENT:  Head: Normocephalic and atraumatic.  Right Ear: External ear normal.  Left Ear: External ear normal.  Nose: Nose normal.  Mouth/Throat: Oropharynx is clear and  moist. No oropharyngeal exudate.  Eyes: Conjunctivae are normal. Pupils are equal, round, and reactive to light. No scleral icterus.  Neck: Normal range of motion. Neck supple.  Cardiovascular: Normal rate, regular rhythm and normal heart sounds.  Exam reveals no gallop and no friction rub.   No murmur heard. Pulmonary/Chest: Effort normal and breath sounds normal. No respiratory distress. He has no wheezes. He has no rales. He exhibits no tenderness.  Abdominal:  Soft. Bowel sounds are normal. He exhibits no distension. There is no tenderness.  Musculoskeletal: Normal range of motion. He exhibits edema and tenderness.       Paronychia to right 5th finger   Lymphadenopathy:    He has no cervical adenopathy.  Neurological: He is alert and oriented to person, place, and time. No cranial nerve deficit. He exhibits normal muscle tone. Coordination normal.  Skin: Skin is warm and dry. No rash noted. There is erythema. No cyanosis. No pallor. Nails show no clubbing.  Psychiatric: He has a normal mood and affect. His behavior is normal. Judgment and thought content normal.    ED Course  INCISION AND DRAINAGE Date/Time: 03/30/2012 12:20 AM Performed by: Marisue Humble, Azelia Reiger C. Authorized by: Patrecia Pour Consent: Verbal consent obtained. Written consent not obtained. Risks and benefits: risks, benefits and alternatives were discussed Consent given by: patient Patient understanding: patient states understanding of the procedure being performed Patient consent: the patient's understanding of the procedure does not match consent given Procedure consent: procedure consent does not match procedure scheduled Relevant documents: relevant documents not present or verified Test results: test results not available Site marked: the operative site was not marked Imaging studies: imaging studies not available Patient identity confirmed: verbally with patient and arm band Time out: Immediately prior to procedure a "time out" was called to verify the correct patient, procedure, equipment, support staff and site/side marked as required. Type: abscess Body area: upper extremity Location details: right small finger Anesthesia: digital block Local anesthetic: lidocaine 1% without epinephrine Anesthetic total: 5 ml Patient sedated: no Scalpel size: 10 Incision type: elliptical Complexity: simple Drainage: purulent Drainage amount: moderate Wound treatment: wound  left open Packing material: 1/4 in gauze Patient tolerance: Patient tolerated the procedure well with no immediate complications.   (including critical care time)  Labs Reviewed - No data to display No results found.   Paronychia right 5th fingernail   MDM  Patient here with paronychia of right 5th finger - as the patient is diabetic, I will place him on short course of antibiotics.        Izola Price [place], [place] 03/30/12 0022

## 2012-03-30 NOTE — Discharge Instructions (Signed)
Paronychia  Paronychia is an inflammatory reaction involving the folds of the skin surrounding the fingernail. This is commonly caused by an infection in the skin around a nail. The most common cause of paronychia is frequent wetting of the hands (as seen with bartenders, food servers, nurses or others who wet their hands). This makes the skin around the fingernail susceptible to infection by bacteria (germs) or fungus. Other predisposing factors are:   Aggressive manicuring.   Nail biting.   Thumb sucking.  The most common cause is a staphylococcal (a type of germ) infection, or a fungal (Candida) infection. When caused by a germ, it usually comes on suddenly with redness, swelling, pus and is often painful. It may get under the nail and form an abscess (collection of pus), or form an abscess around the nail. If the nail itself is infected with a fungus, the treatment is usually prolonged and may require oral medicine for up to one year. Your caregiver will determine the length of time treatment is required. The paronychia caused by bacteria (germs) may largely be avoided by not pulling on hangnails or picking at cuticles. When the infection occurs at the tips of the finger it is called felon. When the cause of paronychia is from the herpes simplex virus (HSV) it is called herpetic whitlow.  TREATMENT   When an abscess is present treatment is often incision and drainage. This means that the abscess must be cut open so the pus can get out. When this is done, the following home care instructions should be followed.  HOME CARE INSTRUCTIONS    It is important to keep the affected fingers very dry. Rubber or plastic gloves over cotton gloves should be used whenever the hand must be placed in water.   Keep wound clean, dry and dressed as suggested by your caregiver between warm soaks or warm compresses.   Soak in warm water for fifteen to twenty minutes three to four times per day for bacterial infections. Fungal  infections are very difficult to treat, so often require treatment for long periods of time.   For bacterial (germ) infections take antibiotics (medicine which kill germs) as directed and finish the prescription, even if the problem appears to be solved before the medicine is gone.   Only take over-the-counter or prescription medicines for pain, discomfort, or fever as directed by your caregiver.  SEEK IMMEDIATE MEDICAL CARE IF:   You have redness, swelling, or increasing pain in the wound.   You notice pus coming from the wound.   You have a fever.   You notice a bad smell coming from the wound or dressing.  Document Released: 03/16/2001 Document Revised: 09/09/2011 Document Reviewed: 11/15/2008  [employer] Patient Information 2012 [employer].

## 2012-03-30 NOTE — ED Provider Notes (Signed)
Medical screening examination/treatment/procedure(s) were performed by non-physician practitioner and as supervising physician I was immediately available for consultation/collaboration.  Sunnie Nielsen, MD 03/30/12 [phone]

## 2012-04-28 ENCOUNTER — Other Ambulatory Visit: Payer: Self-pay | Admitting: *Deleted

## 2012-04-28 DIAGNOSIS — E119 Type 2 diabetes mellitus without complications: Secondary | ICD-10-CM

## 2012-05-01 MED ORDER — INSULIN ASPART PROT & ASPART (70-30 MIX) 100 UNIT/ML ~~LOC~~ SUSP: 15.0000 [IU] | Freq: Two times a day (BID) | SUBCUTANEOUS | Status: DC

## 2012-05-01 NOTE — Telephone Encounter (Signed)
Rx called in to pharmacy. 

## 2012-05-03 ENCOUNTER — Telehealth: Payer: Self-pay | Admitting: Internal Medicine

## 2012-05-03 ENCOUNTER — Other Ambulatory Visit: Payer: Self-pay | Admitting: *Deleted

## 2012-05-03 DIAGNOSIS — E119 Type 2 diabetes mellitus without complications: Secondary | ICD-10-CM

## 2012-05-03 MED ORDER — INSULIN NPH ISOPHANE & REGULAR (70-30) 100 UNIT/ML ~~LOC~~ SUSP: SUBCUTANEOUS | Status: DC

## 2012-05-03 NOTE — Telephone Encounter (Signed)
I have made the appropriate changes.   Thanks, [employer]

## 2012-05-03 NOTE — Telephone Encounter (Signed)
I called the [facility] and they do not carry Novolog 70/30 but they can supply Humulin 70/30.  He has been on this in the past. He goes to the [facility] not [facility].  Can you please write a new Rx

## 2012-05-10 NOTE — Telephone Encounter (Signed)
Med called in by [facility]

## 2012-05-15 ENCOUNTER — Encounter: Payer: Self-pay | Admitting: Internal Medicine

## 2012-05-17 ENCOUNTER — Other Ambulatory Visit: Payer: Self-pay | Admitting: *Deleted

## 2012-05-17 DIAGNOSIS — K219 Gastro-esophageal reflux disease without esophagitis: Secondary | ICD-10-CM

## 2012-05-18 MED ORDER — FAMOTIDINE 20 MG PO TABS: 20.0000 mg | ORAL_TABLET | Freq: Two times a day (BID) | ORAL | Status: DC

## 2012-05-22 NOTE — Telephone Encounter (Signed)
Called to pharm 

## 2012-06-04 DIAGNOSIS — I214 Non-ST elevation (NSTEMI) myocardial infarction: Secondary | ICD-10-CM

## 2012-06-04 HISTORY — DX: Non-ST elevation (NSTEMI) myocardial infarction: I21.4

## 2012-06-12 ENCOUNTER — Ambulatory Visit (INDEPENDENT_AMBULATORY_CARE_PROVIDER_SITE_OTHER): Payer: Self-pay | Admitting: Internal Medicine

## 2012-06-12 ENCOUNTER — Encounter: Payer: Self-pay | Admitting: Internal Medicine

## 2012-06-12 VITALS — BP 135/84 | HR 81 | Temp 97.8°F | Ht 70.0 in | Wt 226.1 lb

## 2012-06-12 DIAGNOSIS — E119 Type 2 diabetes mellitus without complications: Secondary | ICD-10-CM

## 2012-06-12 DIAGNOSIS — Z79899 Other long term (current) drug therapy: Secondary | ICD-10-CM

## 2012-06-12 DIAGNOSIS — E785 Hyperlipidemia, unspecified: Secondary | ICD-10-CM

## 2012-06-12 DIAGNOSIS — I10 Essential (primary) hypertension: Secondary | ICD-10-CM

## 2012-06-12 DIAGNOSIS — Z0000 Encounter for general adult medical examination without abnormal findings: Secondary | ICD-10-CM

## 2012-06-12 LAB — CBC
HCT: 42.3 % (ref 39.0–52.0)
MCH: 30 pg (ref 26.0–34.0)
MCHC: 33.1 g/dL (ref 30.0–36.0)
MCV: 90.8 fL (ref 78.0–100.0)
RDW: 13.6 % (ref 11.5–15.5)

## 2012-06-12 LAB — GLUCOSE, CAPILLARY: Glucose-Capillary: 223 mg/dL — ABNORMAL HIGH (ref 70–99)

## 2012-06-12 MED ORDER — INSULIN NPH ISOPHANE & REGULAR (70-30) 100 UNIT/ML ~~LOC~~ SUSP: SUBCUTANEOUS | Status: DC

## 2012-06-12 MED ORDER — LISINOPRIL 2.5 MG PO TABS: 2.5000 mg | ORAL_TABLET | Freq: Every day | ORAL | Status: DC

## 2012-06-12 NOTE — Progress Notes (Signed)
  Subjective:    Patient ID: Benjamin Baker, male    DOB: 03/09/1956, 56 y.o.   MRN: [id number]  HPI 56 year old man with past medical history significant for type 2 diabetes, hypertension, hyperlipidemia comes to the clinic for followup visit almost after a year.  He states that he did not have insurance therefore he did not schedule any visits for almost a year.  Denies any complaints per se including chest pain, shortness of breath, abdominal pain, alteration in bladder or bowel habits.  DM: CBGs are running high with morning blood sugars close to 300's  and evening blood sugars around 250s    Review of Systems  Constitutional: Negative for fever, diaphoresis and fatigue.  HENT: Negative for congestion, rhinorrhea, sneezing and postnasal drip.   Eyes: Negative for visual disturbance.  Respiratory: Negative for cough, choking and chest tightness.   Cardiovascular: Negative for chest pain.  Musculoskeletal: Negative for myalgias, joint swelling and arthralgias.  Neurological: Negative for facial asymmetry, weakness, light-headedness and headaches.       Objective:   Physical Exam  Constitutional: He is oriented to person, place, and time. He appears well-developed and well-nourished. No distress.  HENT:  Head: Normocephalic and atraumatic.  Mouth/Throat: No oropharyngeal exudate.  Eyes: Conjunctivae and EOM are normal. Pupils are equal, round, and reactive to light.  Neck: Normal range of motion. Neck supple. No JVD present. No tracheal deviation present. No thyromegaly present.  Cardiovascular: Normal rate, regular rhythm, normal heart sounds and intact distal pulses.  Exam reveals no gallop and no friction rub.   No murmur heard. Pulmonary/Chest: Effort normal and breath sounds normal. No stridor. No respiratory distress. He has no wheezes.  Abdominal: Soft. Bowel sounds are normal. He exhibits no distension. There is no tenderness. There is no rebound.  Musculoskeletal:  Normal range of motion. He exhibits no edema and no tenderness.  Neurological: He is alert and oriented to person, place, and time. He has normal reflexes. He displays normal reflexes. No cranial nerve deficit. Coordination normal.  Skin: He is not diaphoretic.          Assessment & Plan:

## 2012-06-12 NOTE — Patient Instructions (Signed)
Please schedule a follow up appointment in 4 weeks . Please bring your medication bottles with your next appointment. Please take your medicines as prescribed. I will call you with your lab results if anything will be abnormal.

## 2012-06-13 LAB — COMPLETE METABOLIC PANEL WITH GFR
ALT: 15 U/L (ref 0–53)
AST: 15 U/L (ref 0–37)
Albumin: 3.6 g/dL (ref 3.5–5.2)
Alkaline Phosphatase: 87 U/L (ref 39–117)
GFR, Est Non African American: >89 mL/min
Glucose, Bld: 179 mg/dL — ABNORMAL HIGH (ref 70–99)
Potassium: 4.2 mEq/L (ref 3.5–5.3)
Sodium: 139 mEq/L (ref 135–145)
Total Protein: 6.4 g/dL (ref 6.0–8.3)

## 2012-06-13 LAB — LIPID PANEL
LDL Cholesterol: 105 mg/dL — ABNORMAL HIGH (ref 0–99)
VLDL: 80 mg/dL — ABNORMAL HIGH (ref 0–40)

## 2012-06-13 NOTE — Assessment & Plan Note (Signed)
Lab Results  Component Value Date   HGBA1C 10.5 06/12/2012   HGBA1C 9.1 09/07/2010   CREATININE 0.81 06/12/2012   CREATININE 0.86 09/07/2010   MICROALBUR 3.98* 08/23/2011   MICRALBCREAT 14.9 08/23/2011   CHOL 217* 06/12/2012   HDL 32* 06/12/2012   TRIG 399* 06/12/2012    Last eye exam and foot exam: No results found for this basename: HMDIABEYEEXA, HMDIABFOOTEX    Assessment: Diabetes control: not controlled Progress toward goals: deteriorated Barriers to meeting goals: No regulat clinic follow ups  Plan: Diabetes treatment: Increase morning dose from 30 to 33 and evening dose from 15 to 20 units Refer to: diabetes educator for self-management training and diabetes educator for medical nutrition therapy Instruction/counseling given: reminded to get eye exam, reminded to bring blood glucose meter & log to each visit, reminded to bring medications to each visit, discussed the need for weight loss and discussed diet

## 2012-06-14 ENCOUNTER — Encounter: Payer: Self-pay | Admitting: Gastroenterology

## 2012-06-14 NOTE — Assessment & Plan Note (Signed)
Lab Results  Component Value Date   NA 139 06/12/2012   K 4.2 06/12/2012   CL 106 06/12/2012   CO2 25 06/12/2012   BUN 10 06/12/2012   CREATININE 0.81 06/12/2012   CREATININE 0.86 09/07/2010    BP Readings from Last 3 Encounters:  06/12/12 135/84  03/29/12 111/72  09/30/11 137/73    Assessment: Hypertension control:  controlled  Progress toward goals:  at goal Barriers to meeting goals:  no barriers identified  Plan: Hypertension treatment:  Start on low dose lisinopril for its reno protective effects.

## 2012-06-14 NOTE — Assessment & Plan Note (Signed)
He states that his father has a history of colon cancer- refer him to get a colonoscopy.  He was seen by Dr. Erma Heritage for his eye exam last year but he does not accept orange card. Would work on scheduling a new appointment.

## 2012-06-14 NOTE — Assessment & Plan Note (Signed)
Check lipids and CMP.  

## 2012-06-15 ENCOUNTER — Telehealth: Payer: Self-pay | Admitting: Internal Medicine

## 2012-06-15 DIAGNOSIS — E785 Hyperlipidemia, unspecified: Secondary | ICD-10-CM

## 2012-06-19 MED ORDER — PRAVASTATIN SODIUM 40 MG PO TABS: 40.0000 mg | ORAL_TABLET | Freq: Every evening | ORAL | Status: DC

## 2012-06-19 NOTE — Telephone Encounter (Signed)
Started on stain. Prescription called in at [facility].  Thanks, [employer]

## 2012-06-26 ENCOUNTER — Telehealth: Payer: Self-pay | Admitting: Dietician

## 2012-06-26 NOTE — Telephone Encounter (Signed)
Dr. Dorthula Rue requested patient meet with RD, CDE. Patient scheduled for 06/28/12 at 9:30 am.

## 2012-06-28 ENCOUNTER — Inpatient Hospital Stay (HOSPITAL_COMMUNITY)
Admission: EM | Admit: 2012-06-28 | Discharge: 2012-06-29 | DRG: 282 | Disposition: A | Discharge status: Home / Self Care | Payer: 59 | Attending: Cardiovascular Disease | Admitting: Cardiovascular Disease

## 2012-06-28 ENCOUNTER — Encounter (HOSPITAL_COMMUNITY): Payer: Self-pay | Admitting: *Deleted

## 2012-06-28 ENCOUNTER — Encounter (HOSPITAL_COMMUNITY)
Admission: EM | Disposition: A | Discharge status: Home / Self Care | Payer: Self-pay | Source: Home / Self Care | Attending: Internal Medicine

## 2012-06-28 ENCOUNTER — Ambulatory Visit: Payer: Self-pay | Admitting: Dietician

## 2012-06-28 ENCOUNTER — Emergency Department (HOSPITAL_COMMUNITY): Payer: Self-pay

## 2012-06-28 DIAGNOSIS — E119 Type 2 diabetes mellitus without complications: Secondary | ICD-10-CM

## 2012-06-28 DIAGNOSIS — Z6832 Body mass index (BMI) 32.0-32.9, adult: Secondary | ICD-10-CM

## 2012-06-28 DIAGNOSIS — I200 Unstable angina: Secondary | ICD-10-CM

## 2012-06-28 DIAGNOSIS — I2510 Atherosclerotic heart disease of native coronary artery without angina pectoris: Secondary | ICD-10-CM | POA: Diagnosis present

## 2012-06-28 DIAGNOSIS — Z91199 Patient's noncompliance with other medical treatment and regimen due to unspecified reason: Secondary | ICD-10-CM

## 2012-06-28 DIAGNOSIS — I249 Acute ischemic heart disease, unspecified: Secondary | ICD-10-CM

## 2012-06-28 DIAGNOSIS — IMO0002 Reserved for concepts with insufficient information to code with codable children: Secondary | ICD-10-CM | POA: Diagnosis present

## 2012-06-28 DIAGNOSIS — Z794 Long term (current) use of insulin: Secondary | ICD-10-CM

## 2012-06-28 DIAGNOSIS — F17200 Nicotine dependence, unspecified, uncomplicated: Secondary | ICD-10-CM

## 2012-06-28 DIAGNOSIS — I739 Peripheral vascular disease, unspecified: Secondary | ICD-10-CM

## 2012-06-28 DIAGNOSIS — I10 Essential (primary) hypertension: Secondary | ICD-10-CM

## 2012-06-28 DIAGNOSIS — E118 Type 2 diabetes mellitus with unspecified complications: Secondary | ICD-10-CM | POA: Diagnosis present

## 2012-06-28 DIAGNOSIS — Z7982 Long term (current) use of aspirin: Secondary | ICD-10-CM

## 2012-06-28 DIAGNOSIS — E785 Hyperlipidemia, unspecified: Secondary | ICD-10-CM | POA: Diagnosis present

## 2012-06-28 DIAGNOSIS — Z9119 Patient's noncompliance with other medical treatment and regimen: Secondary | ICD-10-CM

## 2012-06-28 DIAGNOSIS — I214 Non-ST elevation (NSTEMI) myocardial infarction: Principal | ICD-10-CM | POA: Diagnosis present

## 2012-06-28 DIAGNOSIS — R079 Chest pain, unspecified: Secondary | ICD-10-CM

## 2012-06-28 DIAGNOSIS — Z951 Presence of aortocoronary bypass graft: Secondary | ICD-10-CM

## 2012-06-28 DIAGNOSIS — K219 Gastro-esophageal reflux disease without esophagitis: Secondary | ICD-10-CM | POA: Diagnosis present

## 2012-06-28 DIAGNOSIS — E669 Obesity, unspecified: Secondary | ICD-10-CM | POA: Diagnosis present

## 2012-06-28 HISTORY — DX: Hyperlipidemia, unspecified: E78.5

## 2012-06-28 HISTORY — PX: LEFT HEART CATHETERIZATION WITH CORONARY/GRAFT ANGIOGRAM: SHX5450

## 2012-06-28 HISTORY — DX: Tobacco use: Z72.0

## 2012-06-28 LAB — CBC
Hemoglobin: 14 g/dL (ref 13.0–17.0)
Platelets: 227 10*3/uL (ref 150–400)
RBC: 4.51 MIL/uL (ref 4.22–5.81)
WBC: 9.1 10*3/uL (ref 4.0–10.5)

## 2012-06-28 LAB — COMPREHENSIVE METABOLIC PANEL
ALT: 25 U/L (ref 0–53)
AST: 19 U/L (ref 0–37)
Alkaline Phosphatase: 109 U/L (ref 39–117)
CO2: 25 mEq/L (ref 19–32)
Chloride: 103 mEq/L (ref 96–112)
GFR calc Af Amer: >90 mL/min (ref >90)
GFR calc non Af Amer: >90 mL/min (ref >90)
Glucose, Bld: 264 mg/dL — ABNORMAL HIGH (ref 70–99)
Sodium: 140 mEq/L (ref 135–145)
Total Bilirubin: 0.7 mg/dL (ref 0.3–1.2)

## 2012-06-28 LAB — TROPONIN I
Troponin I: 0.42 ng/mL (ref <0.30)
Troponin I: 0.55 ng/mL (ref <0.30)

## 2012-06-28 LAB — GLUCOSE, CAPILLARY

## 2012-06-28 SURGERY — LEFT HEART CATHETERIZATION WITH CORONARY/GRAFT ANGIOGRAM
Anesthesia: LOCAL

## 2012-06-28 MED ORDER — SODIUM CHLORIDE 0.9 % IJ SOLN: 3.0000 mL | INTRAMUSCULAR | Status: DC | PRN

## 2012-06-28 MED ORDER — SODIUM CHLORIDE 0.9 % IV SOLN: 1.0000 mL/kg/h | INTRAVENOUS | Status: AC

## 2012-06-28 MED ORDER — INSULIN ASPART PROT & ASPART (70-30 MIX) 100 UNIT/ML ~~LOC~~ SUSP: 20.0000 [IU] | Freq: Two times a day (BID) | SUBCUTANEOUS | Status: DC

## 2012-06-28 MED ORDER — MORPHINE SULFATE 4 MG/ML IJ SOLN: 4.0000 mg | INTRAMUSCULAR | Status: DC | PRN

## 2012-06-28 MED ORDER — ACETAMINOPHEN 325 MG PO TABS: 650.0000 mg | ORAL_TABLET | ORAL | Status: DC | PRN

## 2012-06-28 MED ORDER — INSULIN ASPART PROT & ASPART (70-30 MIX) 100 UNIT/ML ~~LOC~~ SUSP
20.0000 [IU] | Freq: Every day | SUBCUTANEOUS | Status: DC
  Administered 2012-06-28: 20 [IU] via SUBCUTANEOUS
  Filled 2012-06-28 (×2): qty 3

## 2012-06-28 MED ORDER — MORPHINE SULFATE 4 MG/ML IJ SOLN: 4.0000 mg | Freq: Once | INTRAMUSCULAR | Status: DC

## 2012-06-28 MED ORDER — SODIUM CHLORIDE 0.9 % IJ SOLN: 3.0000 mL | Freq: Two times a day (BID) | INTRAMUSCULAR | Status: DC

## 2012-06-28 MED ORDER — DIAZEPAM 5 MG PO TABS: 5.0000 mg | ORAL_TABLET | ORAL | Status: DC

## 2012-06-28 MED ORDER — LIDOCAINE HCL (PF) 1 % IJ SOLN
INTRAMUSCULAR | Status: AC
  Filled 2012-06-28: qty 30

## 2012-06-28 MED ORDER — INSULIN ASPART 100 UNIT/ML ~~LOC~~ SOLN
0.0000 [IU] | Freq: Three times a day (TID) | SUBCUTANEOUS | Status: DC
  Administered 2012-06-29: 1 [IU] via SUBCUTANEOUS

## 2012-06-28 MED ORDER — PANTOPRAZOLE SODIUM 40 MG PO TBEC
40.0000 mg | DELAYED_RELEASE_TABLET | Freq: Every day | ORAL | Status: DC
  Administered 2012-06-29: 40 mg via ORAL
  Filled 2012-06-28: qty 1

## 2012-06-28 MED ORDER — ASPIRIN EC 81 MG PO TBEC
81.0000 mg | DELAYED_RELEASE_TABLET | Freq: Every day | ORAL | Status: DC
  Administered 2012-06-29: 81 mg via ORAL
  Filled 2012-06-28: qty 1

## 2012-06-28 MED ORDER — SODIUM CHLORIDE 0.9 % IV SOLN: 250.0000 mL | INTRAVENOUS | Status: DC | PRN

## 2012-06-28 MED ORDER — ONDANSETRON HCL 4 MG/2ML IJ SOLN: 4.0000 mg | Freq: Four times a day (QID) | INTRAMUSCULAR | Status: DC | PRN

## 2012-06-28 MED ORDER — ASPIRIN 81 MG PO TABS: 81.0000 mg | ORAL_TABLET | Freq: Every day | ORAL | Status: DC

## 2012-06-28 MED ORDER — HEPARIN (PORCINE) IN NACL 100-0.45 UNIT/ML-% IJ SOLN
1350.0000 [IU]/h | INTRAMUSCULAR | Status: DC
  Administered 2012-06-28: 1350 [IU]/h via INTRAVENOUS
  Filled 2012-06-28 (×3): qty 250

## 2012-06-28 MED ORDER — ZOLPIDEM TARTRATE 5 MG PO TABS: 5.0000 mg | ORAL_TABLET | Freq: Every evening | ORAL | Status: DC | PRN

## 2012-06-28 MED ORDER — SODIUM CHLORIDE 0.9 % IJ SOLN
3.0000 mL | Freq: Two times a day (BID) | INTRAMUSCULAR | Status: DC
  Administered 2012-06-29: 3 mL via INTRAVENOUS

## 2012-06-28 MED ORDER — SIMVASTATIN 40 MG PO TABS
40.0000 mg | ORAL_TABLET | Freq: Every day | ORAL | Status: DC
  Administered 2012-06-28: 40 mg via ORAL
  Filled 2012-06-28 (×2): qty 1

## 2012-06-28 MED ORDER — FENTANYL CITRATE 0.05 MG/ML IJ SOLN
INTRAMUSCULAR | Status: AC
  Filled 2012-06-28: qty 2

## 2012-06-28 MED ORDER — HEPARIN (PORCINE) IN NACL 2-0.9 UNIT/ML-% IJ SOLN
INTRAMUSCULAR | Status: AC
  Filled 2012-06-28: qty 1000

## 2012-06-28 MED ORDER — NITROGLYCERIN 0.2 MG/ML ON CALL CATH LAB
INTRAVENOUS | Status: AC
  Filled 2012-06-28: qty 1

## 2012-06-28 MED ORDER — NITROGLYCERIN IN D5W 200-5 MCG/ML-% IV SOLN
2.0000 ug/min | Freq: Once | INTRAVENOUS | Status: DC
  Filled 2012-06-28: qty 250

## 2012-06-28 MED ORDER — SODIUM CHLORIDE 0.9 % IV SOLN: 250.0000 mL | INTRAVENOUS | Status: DC

## 2012-06-28 MED ORDER — INSULIN ASPART PROT & ASPART (70-30 MIX) 100 UNIT/ML ~~LOC~~ SUSP
33.0000 [IU] | Freq: Every day | SUBCUTANEOUS | Status: DC
  Administered 2012-06-29: 33 [IU] via SUBCUTANEOUS
  Filled 2012-06-28: qty 3

## 2012-06-28 MED ORDER — SODIUM CHLORIDE 0.9 % IV SOLN: INTRAVENOUS | Status: DC

## 2012-06-28 MED ORDER — ALPRAZOLAM 0.25 MG PO TABS: 0.2500 mg | ORAL_TABLET | Freq: Two times a day (BID) | ORAL | Status: DC | PRN

## 2012-06-28 MED ORDER — METOPROLOL TARTRATE 25 MG PO TABS
25.0000 mg | ORAL_TABLET | Freq: Two times a day (BID) | ORAL | Status: DC
  Administered 2012-06-28 – 2012-06-29 (×2): 25 mg via ORAL
  Filled 2012-06-28 (×4): qty 1

## 2012-06-28 MED ORDER — MIDAZOLAM HCL 2 MG/2ML IJ SOLN
INTRAMUSCULAR | Status: AC
  Filled 2012-06-28: qty 2

## 2012-06-28 MED ORDER — LISINOPRIL 2.5 MG PO TABS
2.5000 mg | ORAL_TABLET | Freq: Every day | ORAL | Status: DC
  Administered 2012-06-29: 2.5 mg via ORAL
  Filled 2012-06-28 (×2): qty 1

## 2012-06-28 MED ORDER — HEPARIN BOLUS VIA INFUSION
4000.0000 [IU] | Freq: Once | INTRAVENOUS | Status: AC
  Administered 2012-06-28: 4000 [IU] via INTRAVENOUS

## 2012-06-28 NOTE — Progress Notes (Signed)
ANTICOAGULATION CONSULT NOTE - Initial Consult  Pharmacy Consult for Heparin Indication: chest pain/ACS  No Known Allergies  Patient Measurements: Height: 5\' 10"  (177.8 cm) Weight: 226 lb 3.1 oz (102.6 kg) IBW/kg (Calculated) : 73  Heparin Dosing Weight: 94.7 kg  Vital Signs: Temp: 98 F (36.7 C) (09/25 0911) Temp src: Oral (09/25 0911) BP: 108/89 mmHg (09/25 0911) Pulse Rate: 86  (09/25 0911)  Labs:  Basename 06/28/12 0911  HGB 14.0  HCT 41.3  PLT 227  APTT --  LABPROT --  INR --  HEPARINUNFRC --  CREATININE --  CKTOTAL --  CKMB --  TROPONINI --    Estimated Creatinine Clearance: 126.5 ml/min (by C-G formula based on Cr of 0.81).   Medical History: Past Medical History  Diagnosis Date  . Diabetes mellitus   . GERD (gastroesophageal reflux disease)   . CAD (coronary artery disease)   . Hypertension     Assessment: 56 y.o. M who presented to the [facility] on 9/25 with chest pain. The patient has history of a similar presentation ~10 years ago in which he required CABG x 3. Pharmacy has been consulted to start heparin while awaiting further cardiac evaluation. Baseline Hgb/Hct/Plt ok. Cardiac enzymes pending.  Goal of Therapy:  Heparin level 0.3-0.7 units/ml Monitor platelets by anticoagulation protocol: Yes   Plan:  1. Heparin bolus of 4000 units x 1 2. Initiate heparin drip at rate of 1350 units/hr (13.5 ml/hr) 3. Daily heparin levels, CBC 4. Will continue to monitor for any signs/symptoms of bleeding and will follow up with heparin level in 6 hours   Georgina Pillion, PharmD, BCPS Clinical Pharmacist Pager: [phone] 06/28/2012 9:44 AM

## 2012-06-28 NOTE — ED Notes (Signed)
Family at bedside. 

## 2012-06-28 NOTE — ED Notes (Signed)
To ED via [facility] for eval of CP. Started while at home resting. Pt was driving to MD appt when he pulled over due to increased pain. Pain was pressure in center of chest radiating to jaw. 4 nitro and 6mg  MSO4 pain came down to 1 from 7 on EMS arrival. Pt took 2 full ASA prior to EMS arrival. 36yrs ago with same presentation, ended with triple bypass

## 2012-06-28 NOTE — ED Notes (Signed)
Pt denies any CP, reports HA 8/10, no nitro given at this time

## 2012-06-28 NOTE — H&P (Signed)
History and Physical   Patient ID: Benjamin Baker MRN: [id number], DOB/AGE: 54-Sep-1957   Admit date: 06/28/2012 Date of Consult: 06/28/2012   Primary Physician: Elyse Jarvis, MD Primary Cardiologist: Swaziland, Peter, MD - admitted in 2003   HPI: Benjamin Baker is a 56yo male with PMHx significant for CAD (s/p CABG x 3 - LIMA-LAD, R rad-Cx branch, SVG-PDA 03/2002), type 2 DM, HTN, HL, tobacco abuse and GERD who presents to [facility] ED with chest pain.   2D echo 08/2009: poor windows, technically insufficient study, LV function unable to be assessed, mild LVH, septal, posterior and inferolateral wall movement normal, no pericardial effusion Lexiscan Myoview 08/2009: no evidence of ischemia, probable inferior scar, LVEF 63%  He reports experiencing sharp, constant left-sided chest pain this AM radiating to his left face/jaw/neck and left upper arm with associated shortness of breath, diaphoresis, nausea and lightheadedness aggravated by laying flat and alleviated by NTG rated at a 8/10 at rest. This is reminiscent of his prior MI, but less severe. He denies aggravation with cough, deep inspiration, palpation or position changes. Denies orthopnea, PND, increased LE edema. He reports a chronic nonproductive cough. No fevers, chills, vomiting, diarrhea, extended travel, unilateral leg swelling, redness or tenderness. Prior to today, he reports he could reliably reproduce this pain on over-exertion (qualified as walking up a flight of stairs or bending over). He takes all of his meds. He continues to smoke 1/2 PPD. He notes uncontrolled blood sugar, elevated BP, and LDL earlier this month was elevated. He does note increased bilateral calf pain (L>R) described as burning on exertion and relieved with rest. The pain persisted and EMS was called. He had taken full-dose ASA x 2 prior to their arrival. He received NTG x 8 and MSO4 in transit and in the ED.  In the ED, EKG reveals NSR and no acute ischemic  changes. Initial trop-I WNL. CXR reveals mild bibasilar atelectasis, no acute infiltrate/edema. BMET and CBC unremarkable. He is currently chest pain free, but does endorse a headache. He has been heparinized.   Problem List: Past Medical History  Diagnosis Date  . Diabetes mellitus   . GERD (gastroesophageal reflux disease)   . CAD (coronary artery disease)   . Hypertension   . Hyperlipidemia     Past Surgical History  Procedure Date  . Coronary artery bypass graft 03/16/2002    a) x3: LIMA-LAD, R rad-Cx branch, SVG-PDA      Allergies: No Known Allergies  Home Medications: Prior to Admission medications   Medication Sig Start Date End Date Taking? Authorizing Provider  aspirin 81 MG tablet Take 81 mg by mouth daily.     Yes Historical Provider, MD  famotidine (PEPCID) 20 MG tablet Take 20 mg by mouth 2 (two) times daily. 05/17/12 05/17/13 Yes Elyse Jarvis, MD  insulin NPH-insulin regular (NOVOLIN 70/30) (70-30) 100 UNIT/ML injection Inject 20-33 Units into the skin 2 (two) times daily with a meal. Inject 33 units into the skin in the morning and 20 units into the skin in the evening. 06/12/12  Yes Elyse Jarvis, MD  lisinopril (PRINIVIL,ZESTRIL) 2.5 MG tablet Take 2.5 mg by mouth daily. 06/12/12 06/12/13 Yes Elyse Jarvis, MD  metFORMIN (GLUCOPHAGE) 1000 MG tablet Take 1 tablet (1,000 mg total) by mouth 2 (two) times daily with a meal. 10/27/11 10/26/12 Yes Elyse Jarvis, MD  pravastatin (PRAVACHOL) 40 MG tablet Take 40 mg by mouth every evening. 06/19/12 06/19/13 Yes Elyse Jarvis, MD    Inpatient Medications:     .  heparin  4,000 Units Intravenous Once  . nitroGLYCERIN  2-200 mcg/min Intravenous Once  . DISCONTD:  morphine injection  4 mg Intravenous Once    (Not in a hospital admission)  Family History  Problem Relation Age of Onset  . Insulin resistance Mother 55  . Colon cancer Father 81  . Heart attack Father 21     History   Social History  . Marital Status: Divorced     Spouse Name: N/A    Number of Children: N/A  . Years of Education: N/A   Occupational History  . Not on file.   Social History Main Topics  . Smoking status: Current Every Day Smoker    Types: Cigarettes  . Smokeless tobacco: Not on file  . Alcohol Use: No  . Drug Use: Not on file  . Sexually Active: Not on file   Other Topics Concern  . Not on file   Social History Narrative  . No narrative on file     Review of Systems: General: negative for chills, fever, night sweats or weight changes.  Cardiovascular: positive for chest pain, shortness of breath, negative for increased LE edema, orthopnea, palpitations, paroxysmal nocturnal dyspnea Dermatological: negative for rash Respiratory: positive for chronic nonproductive cough, negative for wheezing Urologic: negative for hematuria Abdominal: positive for nausea, negative for vomiting, diarrhea, bright red blood per rectum, melena, or hematemesis Neurologic: positive for lightheadedness, negative for visual changes, syncope Extremities: positive for L>R calf pain on moderate-heavy exertion All other systems reviewed and are otherwise negative except as noted above.  Physical Exam: Blood pressure 108/89, pulse 86, temperature 98 F (36.7 C), temperature source Oral, resp. rate 16, height 5\' 10"  (1.778 m), weight 102.6 kg (226 lb 3.1 oz), SpO2 100.00%.    General: Well developed, well nourished, in no acute distress. Head: Normocephalic, atraumatic, sclera non-icteric, no xanthomas, nares are without discharge.  Neck: Negative for carotid bruits. JVD not elevated. Lungs: Distant breath sounds, no appreciable wheezes, rales, or rhonchi. Breathing is unlabored. Heart: RRR with S1 S2. No murmurs, rubs, or gallops appreciated. Abdomen: Soft, non-tender, non-distended with normoactive bowel sounds. No hepatomegaly. No rebound/guarding. No obvious abdominal masses. Msk:  Strength and tone appears normal for age. Extremities: No  clubbing, cyanosis or edema.  Distal pedal pulses unequal- R 2+ L faint->1+. Neuro: Alert and oriented X 3. Moves all extremities spontaneously. Psych:  Responds to questions appropriately with a normal affect.  Labs: Recent Labs  [facility] 06/28/12 0911   WBC 9.1   HGB 14.0   HCT 41.3   MCV 91.6   PLT 227   Lab 06/28/12 0911  NA 140  K 4.2  CL 103  CO2 25  BUN 12  CREATININE 0.83  CALCIUM 9.2  PROT 6.4  BILITOT 0.7  ALKPHOS 109  ALT 25  AST 19  AMYLASE --  LIPASE --  GLUCOSE 264*   Recent Labs  Basename 06/28/12 0912   CKTOTAL --   CKMB --   CKMBINDEX --   TROPONINI <0.30   Radiology/Studies: Dg Chest Portable 1 View  06/28/2012  *RADIOLOGY REPORT*  Clinical Data: Chest pain, shortness of breath  PORTABLE CHEST - 1 VIEW  Comparison: 08/29/09  Findings: Cardiomediastinal silhouette is stable.  Status post CABG.  No acute infiltrate or pleural effusion.  No pulmonary edema.  Bilateral mild basilar atelectasis.  IMPRESSION: No acute infiltrate or pulmonary edema.  Bilateral mild basilar atelectasis.  Status post CABG.   Original Report Authenticated By: Lang Snow  POP, M.D.     EKG: NSR, 96 bpm, no ST/T changes  ASSESSMENT:   1. ACS/CAD 2. Type 2 DM 3. HTN 4. Hyperlipidemia 5. Claudication 6. Tobacco abuse 7. GERD  DISCUSSION/PLAN:   the patient's HPI and quality and timing of his chest pain is suggestive of unstable angina. EKG and initial troponin does not indicate evidence of ischemia, however he does have significant, uncontrolled cardiac risk factors (BP elevated, LDL 105, A1C 10.5 earlier this month and continued tobacco abuse), is 10 years post-CABG and pain is very similar to that experienced prior to bypass grafting. He has underwent stress testing about 3 years ago which was normal. EF normal at that time. Given his uncontrolled risk factors, typical patency rates for bypass grafts and HPI concerning for unstable angina, would favor diagnostic angiography +/-  PCI to evaluate his native coronaries and bypass grafts. Will admit to telemetry and cycle cardiac biomarkers. Has not eaten today, will see if we can schedule him for cath today. Keep NPO and continue heparin. Continue outpatient medications. No need for further risk stratification given his recent PCP visit. Will add low-dose Lopressor. Continue ACEi, statin and PPI. MSO4 PRN for anginal relief. Hold metformin, continue outpatient insulin regimen. Have stressed tobacco cessation. He does have decreased pedal pulse on the left and endorses claudication symptoms. Would follow and consider ABIs with Dr. Swaziland as an outpatient.    Signed, R. Hurman Horn, PA-C 06/28/2012, 11:30 AM   I have seen, examined the patient, and reviewed the above assessment and plan.  Changes to above are made where necessary.   Pt has known h/o CAD and is s/p CABG.  He now presents with chest pain and SOB similar to his prior angina.  He reports exertional symptoms for several months.  He now has symptoms at rest concerning for unstable angina.  He continues to smoke and has uncontrolled diabetes/ HTN.  He has not been compliant with follow-up visits.  I had a long and frank conversation with him today regarding his risks for progressive CAD given his risks factors.  Lifestyle modification including smoking cessation were discussed at length.  At this time, I think that further coronary assessment with cath is necessary. Risks, benefits, and alternatives to cath were discussed at length with the patient who wishes to proceed. He will be admitted to the cardiology service for management.     Co Sign: Hillis Range, MD 06/28/2012 12:57 PM

## 2012-06-28 NOTE — ED Provider Notes (Signed)
History     CSN: [id number]  Arrival date & time 06/28/12  [id number]   First MD Initiated Contact with Patient 06/28/12 [phone]      Chief Complaint  Patient presents with  . Chest Pain    (Consider location/radiation/quality/duration/timing/severity/associated sxs/prior treatment) HPI Comments: Patient reports he was laying in bed when he developed left sided chest pressure with associated sharp pain radiating to his left jaw and upper arm, associated SOB, nausea, lightheadedness.  Pain is worse with laying flat, better with sitting up.  Also improved with nitro.  No change with deep inspiration.  Pt does not know if exertion would worsen his pain.  Pain is currently 1/10 intensity.  Pt states this feels exactly like his MI 10 years ago that required a triple bypass.  Pt has not seen his cardiologist Dr Swaziland since then, states he does not experience any anginal symptoms or chest pain normally.  Pt was given ASA and nitroglycerin by EMS.  Pt has hx MI, HTN, hyperlipidemia, DM, and is an active smoker.   Patient is a 56 y.o. male presenting with chest pain. The history is provided by the patient.  Chest Pain Primary symptoms include shortness of breath and nausea. Pertinent negatives for primary symptoms include no cough, no palpitations, no abdominal pain and no vomiting.  Pertinent negatives for associated symptoms include no numbness and no weakness.     Past Medical History  Diagnosis Date  . Diabetes mellitus   . GERD (gastroesophageal reflux disease)   . CAD (coronary artery disease)   . Hypertension     Past Surgical History  Procedure Date  . Coronary artery bypass graft     History reviewed. No pertinent family history.  History  Substance Use Topics  . Smoking status: Current Every Day Smoker    Types: Cigarettes  . Smokeless tobacco: Not on file  . Alcohol Use: No      Review of Systems  Respiratory: Positive for shortness of breath. Negative for cough.     Cardiovascular: Positive for chest pain. Negative for palpitations and leg swelling.  Gastrointestinal: Positive for nausea. Negative for vomiting, abdominal pain and diarrhea.  Genitourinary: Negative for dysuria.  Neurological: Negative for weakness and numbness.  All other systems reviewed and are negative.    Allergies  Review of patient's allergies indicates no known allergies.  Home Medications   Current Outpatient Rx  Name Route Sig Dispense Refill  . ASPIRIN 81 MG PO TABS Oral Take 81 mg by mouth daily.      Marland Kitchen FAMOTIDINE 20 MG PO TABS Oral Take 1 tablet (20 mg total) by mouth 2 (two) times daily. 180 tablet 1  . INSULIN ISOPHANE & REGULAR (70-30) 100 UNIT/ML  SUSP  Inject 33 units into the skin in the morning and 20 units into the skin in the evening. 10 mL 3  . LISINOPRIL 2.5 MG PO TABS Oral Take 1 tablet (2.5 mg total) by mouth daily. 30 tablet 1  . METFORMIN HCL 1000 MG PO TABS Oral Take 1 tablet (1,000 mg total) by mouth 2 (two) times daily with a meal. 180 tablet 2  . PRAVASTATIN SODIUM 40 MG PO TABS Oral Take 1 tablet (40 mg total) by mouth every evening. 30 tablet 3    BP 108/89  Pulse 86  Temp 98 F (36.7 C) (Oral)  Resp 16  SpO2 100%  Physical Exam  Nursing note and vitals reviewed. Constitutional: He appears well-developed and well-nourished. No distress.  HENT:  Head: Normocephalic and atraumatic.  Neck: Neck supple.  Cardiovascular: Normal rate, regular rhythm and intact distal pulses.   Pulmonary/Chest: Effort normal and breath sounds normal. No respiratory distress. He has no wheezes. He has no rales.  Abdominal: Soft. He exhibits no distension and no mass. There is no tenderness. There is no rebound and no guarding.  Musculoskeletal: He exhibits no edema.  Neurological: He is alert. He exhibits normal muscle tone.  Skin: He is not diaphoretic.    ED Course  Procedures (including critical care time)  Labs Reviewed  COMPREHENSIVE METABOLIC  PANEL - Abnormal; Notable for the following:    Glucose, Bld 264 (*)     Albumin 3.4 (*)     All other components within normal limits  CBC  TROPONIN I  PROTIME-INR  HEPARIN LEVEL (UNFRACTIONATED)   Dg Chest Portable 1 View  06/28/2012  *RADIOLOGY REPORT*  Clinical Data: Chest pain, shortness of breath  PORTABLE CHEST - 1 VIEW  Comparison: 08/29/09  Findings: Cardiomediastinal silhouette is stable.  Status post CABG.  No acute infiltrate or pleural effusion.  No pulmonary edema.  Bilateral mild basilar atelectasis.  IMPRESSION: No acute infiltrate or pulmonary edema.  Bilateral mild basilar atelectasis.  Status post CABG.   Original Report Authenticated By: Natasha Mead, M.D.     9:16 AM Discussed patient with Dr Patria Mane who recommends starting heparin per pharmacy, nitro drip.  Plan for cardiology consult/admission.    10:04 AM I spoke with Theodore Demark, PA-C, of [facility] who will come to ED to see patient.     Date: 06/28/2012  Rate: 96  Rhythm: normal sinus rhythm  QRS Axis: normal  Intervals: normal  ST/T Wave abnormalities: Q waves  Conduction Disutrbances:none  Narrative Interpretation:   Old EKG Reviewed: unchanged   1. Chest pain     MDM  Pt with hx MI and triple bypass 10 years ago, not following up regularly with cardiology, also with HTN, hyperlipidemia, uncontrolled DM, and is an active smoker, presents with symptoms he states are identical to his previous MI.  EKG unchanged, first troponin negative.  Pt admitted to [facility] cardiology (patient's cardiologist is Dr Peter Swaziland).          [place], [place] 06/28/12 1106

## 2012-06-28 NOTE — CV Procedure (Signed)
   Cardiac Catheterization Procedure Note  Name: Benjamin Baker MRN: [id number] DOB: Nov 28, 1955  Procedure: Left Heart Cath, Selective Coronary Angiography, LIMA angiography, saphenous vein graft angiography, LV angiography  Indication: Unstable angina   Procedural details: The right groin was prepped, draped, and anesthetized with 1% lidocaine. Using modified Seldinger technique, a 5 French sheath was introduced into the right femoral artery. Standard Judkins catheters were used for coronary angiography and left ventriculography. Catheter exchanges were performed over a guidewire. A LIMA catheter was used for injection of left internal mammary artery. An MPA-2 catheter was used for injection of the saphenous vein graft to PDA. There were no immediate procedural complications. The patient was transferred to the post catheterization recovery area for further monitoring.  Procedural Findings: Hemodynamics:  AO 119/71 with a mean of 94 LV 119/17   Coronary angiography: Coronary dominance: right  Left mainstem: The left main stem is widely patent without obstructive disease.  Left anterior descending (LAD): The proximal LAD is patent. There is diffuse disease throughout the mid LAD. The first diagonal branch is patent. There is severe mid LAD stenosis of 90%, after which the vessel fills competitively through left internal mammary artery flow.  Left circumflex (LCx): The AV circumflex is patent in its proximal aspect. There is diffuse 75% stenosis leading into the second OM. The first OM is tiny. The circumflex is occluded after the second OM.  Right coronary artery (RCA): Total occlusion in the proximal RCA.  Free radial graft to obtuse marginal: Widely patent. No stenosis identified. The native OM is patent throughout.  Saphenous vein graft to right PDA is widely patent without obstruction.  LIMA to LAD: widely patent with competitive filling of the mid and distal LAD noted.  Left  ventriculography: Left ventricular systolic function is normal, LVEF is estimated at 60-65%, there is no significant mitral regurgitation   Final Conclusions:   1. Severe three-vessel coronary artery disease 2. Status post aortocoronary bypass surgery with continued patency of the LIMA to LAD, free radial to obtuse marginal, and saphenous vein graft to right PDA. 3. Normal left ventricular systolic function with an ejection fraction of 60-65%  Recommendations: Medical therapy  [name] 06/28/2012, 3:49 PM

## 2012-06-28 NOTE — ED Provider Notes (Signed)
Medical screening examination/treatment/procedure(s) were conducted as a shared visit with non-physician practitioner(s) and myself.  I personally evaluated the patient during the encounter  CRITICAL CARE Performed by: Lyanne Co   Total critical care time: 30 Critical care time was exclusive of separately billable procedures and treating other patients. Critical care was necessary to treat or prevent imminent or life-threatening deterioration. Critical care was time spent personally by me on the following activities: development of treatment plan with patient and/or surrogate as well as nursing, discussions with consultants, evaluation of patient's response to treatment, examination of patient, obtaining history from patient or surrogate, ordering and performing treatments and interventions, ordering and review of laboratory studies, ordering and review of radiographic studies, pulse oximetry and re-evaluation of patient's condition.  Patient starts concerning for unstable angina.  His EKG is normal.  His first troponin is normal.  The patient was treated with nitroglycerin drip and heparin drip.  Cardiology will be consult as I believe the patient will likely benefit from heart catheterization to further define his bypass graft anatomy.  He is pain-free at this time.  Aspirin prior to arrival.  1. Chest pain   2. Unstable angina    Dg Chest Portable 1 View  06/28/2012  *RADIOLOGY REPORT*  Clinical Data: Chest pain, shortness of breath  PORTABLE CHEST - 1 VIEW  Comparison: 08/29/09  Findings: Cardiomediastinal silhouette is stable.  Status post CABG.  No acute infiltrate or pleural effusion.  No pulmonary edema.  Bilateral mild basilar atelectasis.  IMPRESSION: No acute infiltrate or pulmonary edema.  Bilateral mild basilar atelectasis.  Status post CABG.   Original Report Authenticated By: Natasha Mead, M.D.    I personally reviewed the imaging tests through PACS system  I reviewed available  ER/hospitalization records thought the EMR Results for orders placed during the hospital encounter of 06/28/12  CBC      Component Value Range   WBC 9.1  4.0 - 10.5 K/uL   RBC 4.51  4.22 - 5.81 MIL/uL   Hemoglobin 14.0  13.0 - 17.0 g/dL   HCT 40.9  81.1 - 91.4 %   MCV 91.6  78.0 - 100.0 fL   MCH 31.0  26.0 - 34.0 pg   MCHC 33.9  30.0 - 36.0 g/dL   RDW 78.2  95.6 - 21.3 %   Platelets 227  150 - 400 K/uL  COMPREHENSIVE METABOLIC PANEL      Component Value Range   Sodium 140  135 - 145 mEq/L   Potassium 4.2  3.5 - 5.1 mEq/L   Chloride 103  96 - 112 mEq/L   CO2 25  19 - 32 mEq/L   Glucose, Bld 264 (*) 70 - 99 mg/dL   BUN 12  6 - 23 mg/dL   Creatinine, Ser 0.86  0.50 - 1.35 mg/dL   Calcium 9.2  8.4 - 57.8 mg/dL   Total Protein 6.4  6.0 - 8.3 g/dL   Albumin 3.4 (*) 3.5 - 5.2 g/dL   AST 19  0 - 37 U/L   ALT 25  0 - 53 U/L   Alkaline Phosphatase 109  39 - 117 U/L   Total Bilirubin 0.7  0.3 - 1.2 mg/dL   GFR calc non Af Amer >90  >90 mL/min   GFR calc Af Amer >90  >90 mL/min  TROPONIN I      Component Value Range   Troponin I <0.30  <0.30 ng/mL  PROTIME-INR      Component Value Range  Prothrombin Time 12.6  11.6 - 15.2 seconds   INR 0.95  0.00 - 1.49   Filed Vitals:   06/28/12 0911  BP: 108/89  Pulse: 86  Temp: 98 F (36.7 C)  Resp: 16     Lyanne Co, MD 06/28/12 1115

## 2012-06-28 NOTE — ED Notes (Signed)
Pt took 650 ASA pta

## 2012-06-28 NOTE — Interval H&P Note (Signed)
History and Physical Interval Note:  06/28/2012 2:54 PM  Benjamin Baker  has presented today for surgery, with the diagnosis of cp  The various methods of treatment have been discussed with the patient and family. After consideration of risks, benefits and other options for treatment, the patient has consented to  Procedure(s) (LRB) with comments: LEFT HEART CATHETERIZATION WITH CORONARY/GRAFT ANGIOGRAM (N/A) as a surgical intervention .  The patient's history has been reviewed, patient examined, no change in status, stable for surgery.  I have reviewed the patient's chart and labs.  Questions were answered to the patient's satisfaction.     [name]

## 2012-06-28 NOTE — ED Notes (Signed)
Cardiology to bedside. 

## 2012-06-28 NOTE — Progress Notes (Signed)
Called by RN re: elevated troponin. Labs reviewed and troponin is minimally elevated. No clear reason for this by cath (grafts patent and LV function normal).   Plan: check d-dimer, repeat troponin and continue to cycle for total of 3 lab draws, check 2D echo. If d-dimer elevated, will resume heparin and eval for PE.  [name] 06/28/2012 6:18 PM

## 2012-06-28 NOTE — ED Notes (Signed)
Vital signs stable. 

## 2012-06-29 ENCOUNTER — Encounter (HOSPITAL_COMMUNITY): Payer: Self-pay | Admitting: *Deleted

## 2012-06-29 DIAGNOSIS — I219 Acute myocardial infarction, unspecified: Secondary | ICD-10-CM

## 2012-06-29 DIAGNOSIS — I214 Non-ST elevation (NSTEMI) myocardial infarction: Secondary | ICD-10-CM

## 2012-06-29 LAB — CBC
HCT: 37.9 % — ABNORMAL LOW (ref 39.0–52.0)
Hemoglobin: 12.5 g/dL — ABNORMAL LOW (ref 13.0–17.0)
MCH: 30.3 pg (ref 26.0–34.0)
MCHC: 33 g/dL (ref 30.0–36.0)
MCV: 92 fL (ref 78.0–100.0)
RBC: 4.12 MIL/uL — ABNORMAL LOW (ref 4.22–5.81)

## 2012-06-29 LAB — BASIC METABOLIC PANEL
BUN: 13 mg/dL (ref 6–23)
CO2: 24 mEq/L (ref 19–32)
Calcium: 8.4 mg/dL (ref 8.4–10.5)
GFR calc non Af Amer: >90 mL/min (ref >90)
Glucose, Bld: 134 mg/dL — ABNORMAL HIGH (ref 70–99)

## 2012-06-29 LAB — GLUCOSE, CAPILLARY: Glucose-Capillary: 140 mg/dL — ABNORMAL HIGH (ref 70–99)

## 2012-06-29 MED ORDER — NITROGLYCERIN 0.4 MG SL SUBL: 0.4000 mg | SUBLINGUAL_TABLET | SUBLINGUAL | Status: DC | PRN

## 2012-06-29 MED ORDER — METOPROLOL TARTRATE 25 MG PO TABS: 25.0000 mg | ORAL_TABLET | Freq: Two times a day (BID) | ORAL | Status: DC

## 2012-06-29 MED ORDER — CLOPIDOGREL BISULFATE 75 MG PO TABS: 75.0000 mg | ORAL_TABLET | Freq: Every day | ORAL | Status: DC

## 2012-06-29 MED ORDER — METFORMIN HCL 1000 MG PO TABS: 1000.0000 mg | ORAL_TABLET | Freq: Two times a day (BID) | ORAL | Status: DC

## 2012-06-29 MED ORDER — ISOSORBIDE MONONITRATE ER 30 MG PO TB24: 30.0000 mg | ORAL_TABLET | Freq: Every day | ORAL | Status: DC

## 2012-06-29 MED ORDER — ISOSORBIDE MONONITRATE ER 30 MG PO TB24
30.0000 mg | ORAL_TABLET | Freq: Every day | ORAL | Status: DC
  Administered 2012-06-29: 30 mg via ORAL
  Filled 2012-06-29: qty 1

## 2012-06-29 NOTE — Progress Notes (Signed)
Patients cbg=140 He was due for 1 unit SSI-novolog and 33 units 70/30 Patient stated he did not eat much of his meal.  I called Ward Givens, NP, he stated to give 70/30 but not SSI.

## 2012-06-29 NOTE — Progress Notes (Signed)
Patient Name: Benjamin Baker Date of Encounter: 06/29/2012     Principal Problem:  *NSTEMI (non-ST elevated myocardial infarction) Active Problems:  CORONARY ARTERY BYPASS GRAFT, THREE VESSEL, HX OF  DIABETES MELLITUS, TYPE II  DYSLIPIDEMIA  TOBACCO ABUSE  HYPERTENSION  GERD  Claudication    SUBJECTIVE  56 year old male with above problem list, who presented to [facility] with chest pain, dyspnea, and diaphoresis yesterday morning. Diagnostic cath yesterday afternoon revealed significant three-vessel CAD, 3/3patent grafts from 2003 CABG, and LVEF of 60-65%. No interventions were done at that time and medical therapy was recommended. Patient states he has had no chest pain, dyspnea, or diaphoreses since noon yesterday at which point he had already received NTG and morphine. Overnight, troponin has trended upwards, peaking @ 0.89 @ 0341 but is already trending back down and was 0.40 this AM.  D-Dimer is normal.  He has ambulated some in his room this AM and has been symptom free.  He is eager to go home. Echo is pending.  CURRENT MEDS    . aspirin EC  81 mg Oral Daily  . fentaNYL      . heparin      . insulin aspart  0-9 Units Subcutaneous TID WC  . insulin aspart protamine-insulin aspart  33 Units Subcutaneous Q breakfast   And  . insulin aspart protamine-insulin aspart  20 Units Subcutaneous Q supper  . lidocaine      . lisinopril  2.5 mg Oral Daily  . metoprolol tartrate  25 mg Oral BID  . midazolam      . nitroGLYCERIN      . pantoprazole  40 mg Oral Q0600  . simvastatin  40 mg Oral q1800   OBJECTIVE  Filed Vitals:   06/29/12 0000 06/29/12 0400 06/29/12 0800 06/29/12 1059  BP: 130/82 135/77 135/83 141/89  Pulse: 63 66 70 89  Temp: 97.7 F (36.5 C) 98.2 F (36.8 C) 97.8 F (36.6 C)   TempSrc: Oral Oral    Resp: 18 18 17    Height:      Weight:      SpO2: 99% 98% 99%     Intake/Output Summary (Last 24 hours) at 06/29/12 1108 Last data filed at 06/28/12 1700  Gross  per 24 hour  Intake    480 ml  Output      0 ml  Net    480 ml   Filed Weights   06/28/12 0911 06/28/12 1305  Weight: 226 lb 3.1 oz (102.6 kg) 226 lb (102.513 kg)   PHYSICAL EXAM  General: Pleasant, NAD. Neuro: Alert and oriented X 3. Moves all extremities spontaneously. Psych: Normal affect. HEENT:  Normal  Neck: Supple without bruits or JVD. Lungs:  Resp regular and unlabored, CTA. Heart: RRR no s3, s4, or murmurs. Abdomen: Soft, non-tender, non-distended, BS + x 4.  Extremities: No clubbing, cyanosis or edema. DP/PT/Radials 1+ and equal bilaterally. Procedure site at Right groin has no active bleeding, hematoma, or bruit.   Accessory Clinical Findings  CBC  Basename 06/29/12 0322 06/28/12 0911  WBC 12.1* 9.1  NEUTROABS -- --  HGB 12.5* 14.0  HCT 37.9* 41.3  MCV 92.0 91.6  PLT 211 227   Basic Metabolic Panel  Basename 06/29/12 0322 06/28/12 0911  NA 140 140  K 3.6 4.2  CL 106 103  CO2 24 25  GLUCOSE 134* 264*  BUN 13 12  CREATININE 0.77 0.83  CALCIUM 8.4 9.2  MG -- --  PHOS -- --  Liver Function Tests  Basename 06/28/12 0911  AST 19  ALT 25  ALKPHOS 109  BILITOT 0.7  PROT 6.4  ALBUMIN 3.4*   Cardiac Enzymes  Basename 06/29/12 0846 06/29/12 0321 06/28/12 2044  CKTOTAL -- -- --  CKMB -- -- --  CKMBINDEX -- -- --  TROPONINI 0.40* 0.89* 0.87*   D-Dimer  Basename 06/28/12 1821  DDIMER <0.27   Thyroid Function Tests  Basename 06/28/12 1641  TSH 2.826  T4TOTAL --  T3FREE --  THYROIDAB --   TELE  RSR with occasional PAC rate 70.  ECG  Pending  Radiology/Studies  Dg Chest Portable 1 View  06/28/2012  *RADIOLOGY REPORT*  Clinical Data: Chest pain, shortness of breath  PORTABLE CHEST - 1 VIEW  Comparison: 08/29/09  Findings: Cardiomediastinal silhouette is stable.  Status post CABG.  No acute infiltrate or pleural effusion.  No pulmonary edema.  Bilateral mild basilar atelectasis.  IMPRESSION: No acute infiltrate or pulmonary edema.   Bilateral mild basilar atelectasis.  Status post CABG.   Original Report Authenticated By: Natasha Mead, M.D.     ASSESSMENT AND PLAN  1.  NSTEMI/CAD: s/p diagnostic cardiac cath yesterday - 3/3 patent grafts with native multivessel dzs.  Med Rx warranted.  Troponin's rose overnight and already trending back down.  He  has been pain free since noon yesterday and has been ambulating some w/o difficulty.  Echo pending this AM.  Cont asa, statin, bb, acei.  With elevated troponin, consider addition of plavix.  Add low-dose nitrate.  2.  Type 2 DM: Fasting glucose this morning 140, continue current insulin regimen.  Pt reports poor control at home despite compliance with insulin regimen.  He f/u @ [facility] outpt clinic.  3.  HTN: Stable.  Cont current medications.  4.  Tobacco Abuse: Importance of cessation stressed to patient. He is not committed to quitting at this time.  He says he has tried quitting before but "couldn't."  Explained that relapsing is part of quitting process.   5.  HL:  Cont statin therapy.  6.  Dispo:  F/u echo.  Cardiac rehab to see this AM.  Prob d/c this afternoon if no further chest pain and echo ok.  Signed, Nicolasa Ducking NP  Patient seen, examined. Available data reviewed. Agree with findings, assessment, and plan as outlined by Ward Givens, NP. Pt independently interviewed and examined. He had a small NSTEMI with preserved LV function. D-dimer is negative. Question small vessel event? He is stable for discharge. Main issue relates to lifestyle (tobacco, obesity, dietary noncompliance, etc). He was counseled accordingly.   [name], M.D. 06/29/2012 1:28 PM

## 2012-06-29 NOTE — Discharge Summary (Addendum)
Patient ID: Benjamin Baker,  MRN: [id number], DOB/AGE: 02-04-1956 56 y.o.  Admit date: 06/28/2012 Discharge date: 06/29/2012  Primary Care Provider: SAWHNEY,MEGHA Primary Cardiologist: P. [place], MD  Discharge Diagnoses Principal Problem:  *NSTEMI (non-ST elevated myocardial infarction)  **s/p Cath this admission revealing native multi-vessel CAD and 3/3 patent grafts.  Active Problems:  CORONARY ARTERY BYPASS GRAFT, THREE VESSEL, HX OF  DIABETES MELLITUS, TYPE II  DYSLIPIDEMIA  TOBACCO ABUSE  **Ongoing  HYPERTENSION  GERD  Claudication  Allergies No Known Allergies  Procedures  Cardiac Catheterization 06/28/2012  Procedural Findings:  Hemodynamics:  AO 119/71 with a mean of 94  LV 119/17  Coronary angiography:  Coronary dominance: right  Left mainstem: The left main stem is widely patent without obstructive disease.   Left anterior descending (LAD): The proximal LAD is patent. There is diffuse disease throughout the mid LAD. The first diagonal branch is patent. There is severe mid LAD stenosis of 90%, after which the vessel fills competitively through left internal mammary artery flow.   Left circumflex (LCx): The AV circumflex is patent in its proximal aspect. There is diffuse 75% stenosis leading into the second OM. The first OM is tiny. The circumflex is occluded after the second OM.   Right coronary artery (RCA): Total occlusion in the proximal RCA.   Free radial graft to obtuse marginal: Widely patent. No stenosis identified. The native OM is patent throughout.   Saphenous vein graft to right PDA is widely patent without obstruction.   LIMA to LAD: widely patent with competitive filling of the mid and distal LAD noted.   Left ventriculography: Left ventricular systolic function is normal, LVEF is estimated at 60-65%, there is no significant mitral regurgitation   Final Conclusions:  1. Severe three-vessel coronary artery disease  2. Status post aortocoronary  bypass surgery with continued patency of the LIMA to LAD, free radial to obtuse marginal, and saphenous vein graft to right PDA.  3. Normal left ventricular systolic function with an ejection fraction of 60-65%   Recommendations: Medical therapy _____________  2D Echocardiogram 06/29/2012  Study Conclusions  - Left ventricle: The cavity size was normal. Wall thickness   was increased in a pattern of mild LVH. Systolic function   was normal. The estimated ejection fraction was in the   range of 55% to 60%. Wall motion was normal; there were no   regional wall motion abnormalities. - Left atrium: The atrium was mildly dilated _____________  History of Present Illness  56 y/o male with prior h/o CAD s/p CABG x 3 in June 2003, who was in his USOH until the morning of admission when he began to experience severe, sharp, constant, left-sided chest pain radiating to the left face/jaw/neck and left upper arm associated with sob, diaphoresis, nausea, and lightheadedness.  Symptoms were reminiscent to prior angina and he called EMS.  He was taken to the [facility] ED where after multiple sl NTG tablets, he achieved pain relief.  ECG was non-acute and initial troponin was normal.  He was placed on heparin and admitted for further evaluation.  Hospital Course  Following admission, decision was made to pursue diagnostic catheterization.  This was performed on 9/25 revealing severe, native, multi-vessel CAD with 3/3 patent grafts.  Medical therapy was recommended.  Post-cath, pt had no further chest pain but did rule in for NSTEMI with his troponin eventually rising to 0.89 overnight.  It has since trended down and he has been ambulating without difficulty.  2D echocardiogram was  carried out this AM and showed normal LV function.  We have counseled him on the importance of smoking cessation, strict dietary/diabetes mgmt, medication compliance, and routine primary care follow-up.  In the setting of ACS, we have  initiated both plavix and long-acting nitrate therapy.  He will be discharged this afternoon in good condition.  Discharge Vitals Blood pressure 144/85, pulse 65, temperature 98 F (36.7 C), temperature source Oral, resp. rate 17, height 5\' 10"  (1.778 m), weight 226 lb (102.513 kg), SpO2 96.00%.  Filed Weights   06/28/12 0911 06/28/12 1305  Weight: 226 lb 3.1 oz (102.6 kg) 226 lb (102.513 kg)   Labs  CBC  Basename 06/29/12 0322 06/28/12 0911  WBC 12.1* 9.1  NEUTROABS -- --  HGB 12.5* 14.0  HCT 37.9* 41.3  MCV 92.0 91.6  PLT 211 227   Basic Metabolic Panel  Basename 06/29/12 0322 06/28/12 0911  NA 140 140  K 3.6 4.2  CL 106 103  CO2 24 25  GLUCOSE 134* 264*  BUN 13 12  CREATININE 0.77 0.83  CALCIUM 8.4 9.2  MG -- --  PHOS -- --   Liver Function Tests  Basename 06/28/12 0911  AST 19  ALT 25  ALKPHOS 109  BILITOT 0.7  PROT 6.4  ALBUMIN 3.4*   Cardiac Enzymes  Basename 06/29/12 0846 06/29/12 0321 06/28/12 2044  CKTOTAL -- -- --  CKMB -- -- --  CKMBINDEX -- -- --  TROPONINI 0.40* 0.89* 0.87*   D-Dimer  Basename 06/28/12 1821  DDIMER <0.27   Thyroid Function Tests  Basename 06/28/12 1641  TSH 2.826  T4TOTAL --  T3FREE --  THYROIDAB --   Disposition  Pt is being discharged home today in good condition.  Follow-up Plans & Appointments      Follow-up Information    Follow up with Norma Fredrickson, NP. On 07/13/2012. (10:15 AM - Dr. Elvis Coil Nurse Practitioner)    Contact information:   [address] [place] [address] [phone]       Follow up with [facility]. (1-2 wks to f/u diabetes)        Discharge Medications    Medication List     As of 06/29/2012  2:29 PM    TAKE these medications         aspirin 81 MG tablet   Take 81 mg by mouth daily.      clopidogrel 75 MG tablet   Commonly known as: PLAVIX   Take 1 tablet (75 mg total) by mouth daily.      famotidine 20 MG tablet   Commonly known as:  PEPCID   Take 20 mg by mouth 2 (two) times daily.      insulin NPH-insulin regular (70-30) 100 UNIT/ML injection   Commonly known as: NOVOLIN 70/30   Inject 20-33 Units into the skin 2 (two) times daily with a meal. Inject 33 units into the skin in the morning and 20 units into the skin in the evening.      isosorbide mononitrate 30 MG 24 hr tablet   Commonly known as: IMDUR   Take 1 tablet (30 mg total) by mouth daily.      lisinopril 2.5 MG tablet   Commonly known as: PRINIVIL,ZESTRIL   Take 2.5 mg by mouth daily.      metFORMIN 1000 MG tablet - **RESUME ON 07/01/2012**   Commonly known as: GLUCOPHAGE   Take 1 tablet (1,000 mg total) by mouth 2 (two) times  daily with a meal.      metoprolol tartrate 25 MG tablet   Commonly known as: LOPRESSOR   Take 1 tablet (25 mg total) by mouth 2 (two) times daily.      nitroGLYCERIN 0.4 MG SL tablet   Commonly known as: NITROSTAT   Place 1 tablet (0.4 mg total) under the tongue every 5 (five) minutes as needed for chest pain.      pravastatin 40 MG tablet   Commonly known as: PRAVACHOL   Take 40 mg by mouth every evening.       Outstanding Labs/Studies  None  Duration of Discharge Encounter   Greater than 30 minutes including physician time.  Signed, Nicolasa Ducking NP 06/29/2012, 2:29 PM

## 2012-06-29 NOTE — Progress Notes (Signed)
  Echocardiogram 2D Echocardiogram has been performed.  Sary Bogie 06/29/2012, 10:30 AM

## 2012-07-13 ENCOUNTER — Encounter: Payer: Self-pay | Admitting: Nurse Practitioner

## 2012-07-13 ENCOUNTER — Ambulatory Visit (INDEPENDENT_AMBULATORY_CARE_PROVIDER_SITE_OTHER): Payer: Self-pay | Admitting: Nurse Practitioner

## 2012-07-13 VITALS — BP 120/78 | HR 62 | Ht 69.0 in | Wt 230.0 lb

## 2012-07-13 DIAGNOSIS — I214 Non-ST elevation (NSTEMI) myocardial infarction: Secondary | ICD-10-CM

## 2012-07-13 LAB — BASIC METABOLIC PANEL
BUN: 14 mg/dL (ref 6–23)
CO2: 29 mEq/L (ref 19–32)
Calcium: 9.1 mg/dL (ref 8.4–10.5)
Chloride: 100 mEq/L (ref 96–112)
Creatinine, Ser: 0.9 mg/dL (ref 0.4–1.5)
GFR: 98.51 mL/min (ref >60.00)
Glucose, Bld: 290 mg/dL — ABNORMAL HIGH (ref 70–99)
Potassium: 4.6 mEq/L (ref 3.5–5.1)
Sodium: 136 mEq/L (ref 135–145)

## 2012-07-13 NOTE — Patient Instructions (Addendum)
Increase your insulin to 36 u in the am and 23 in the pm  We are going to check labs today  You will need to postpone your colonoscopy  Stay on your other medicines  Try to get your visit with your primary doctor about your blood sugars  See Dr. Swaziland in 6 weeks  Keep working on stopping smoking  Call the [facility] office at [phone] if you have any questions, problems or concerns.

## 2012-07-13 NOTE — Progress Notes (Signed)
Benjamin Baker Date of Birth: [birth year] Medical Record #[id number]  History of Present Illness: Benjamin Baker is seen back today for a post hospital visit. He is seen for Dr. Sw[place]iland. He has known CAD with remote CABG 10 years ago. Has had recent NSTEMI. Grafts were patent by cath. Will manage medically. He has ongoing tobacco abuse, DM which is uncontrolled, HTN, and HLD.   He comes in today. He is here alone. Says he is doing ok. Has had some chest and back pains but nothing like his usual chest pain syndrome. This definitely feels differently. Still smoking but trying to cut back. Tolerating his medicines. Blood sugars still running high. He was to have a screening colonoscopy later this month as well. Father has had colon cancer. Not dizzy or lightheaded.   Current Outpatient Prescriptions on File Prior to Visit  Medication Sig Dispense Refill  . aspirin 81 MG tablet Take 81 mg by mouth daily.        . clopidogrel (PLAVIX) 75 MG tablet Take 1 tablet (75 mg total) by mouth daily.  30 tablet  6  . famotidine (PEPCID) 20 MG tablet Take 20 mg by mouth 2 (two) times daily.      . insulin NPH-insulin regular (NOVOLIN 70/30) (70-30) 100 UNIT/ML injection Inject 20-33 Units into the skin 2 (two) times daily with a meal. Inject 33 units into the skin in the morning and 20 units into the skin in the evening.      . isosorbide mononitrate (IMDUR) 30 MG 24 hr tablet Take 1 tablet (30 mg total) by mouth daily.  30 tablet  6  . lisinopril (PRINIVIL,ZESTRIL) 2.5 MG tablet Take 2.5 mg by mouth daily.      . metFORMIN (GLUCOPHAGE) 1000 MG tablet Take 1 tablet (1,000 mg total) by mouth 2 (two) times daily with a meal.  180 tablet  2  . metoprolol tartrate (LOPRESSOR) 25 MG tablet Take 1 tablet (25 mg total) by mouth 2 (two) times daily.  60 tablet  6  . nitroGLYCERIN (NITROSTAT) 0.4 MG SL tablet Place 1 tablet (0.4 mg total) under the tongue every 5 (five) minutes as needed for chest pain.    12  . pravastatin  (PRAVACHOL) 40 MG tablet Take 40 mg by mouth every evening.        No Known Allergies  Past Medical History  Diagnosis Date  . Diabetes mellitus   . GERD (gastroesophageal reflux disease)   . CAD (coronary artery disease)     a. 03/2002 - CABG x 3: LIMA > LAD, R Rad > OM2, SVG > PDA.    b. Cath 06/2012 - Significant three-vessel disease, 3/3 grafts patent. LVEF 60-65%   . Hypertension   . Hyperlipidemia   . Tobacco abuse     a. ongoing 06/2012  . NSTEMI (non-ST elevated myocardial infarction) September 2013    grafts patent; EF normal; manage medically    Past Surgical History  Procedure Date  . Coronary artery bypass graft 03/16/2002    a) x3: LIMA-LAD, R rad-Cx branch, SVG-PDA     History  Smoking status  . Current Every Day Smoker  . Types: Cigarettes  Smokeless tobacco  . Not on file    History  Alcohol Use No    Family History  Problem Relation Age of Onset  . Insulin resistance Mother 57  . Colon cancer Father 77  . Heart attack Father 22    Review of Systems: The review  of systems is per the HPI.  All other systems were reviewed and are negative.  Physical Exam: BP 120/78  Pulse 62  Ht 5\' 9"  (1.753 m)  Wt 230 lb (104.327 kg)  BMI 33.96 kg/m2 .Patient is very pleasant and in no acute distress. He is obese. Skin is warm and dry. Color is normal.  HEENT is unremarkable. Normocephalic/atraumatic. PERRL. Sclera are nonicteric. Neck is supple. No masses. No JVD. Lungs are clear. Cardiac exam shows a regular rate and rhythm. Abdomen is soft. Extremities are without edema. Gait and ROM are intact. No gross neurologic deficits noted.   LABORATORY DATA: EKG shows sinus. No acute changes.   Lab Results  Component Value Date   WBC 12.1* 06/29/2012   HGB 12.5* 06/29/2012   HCT 37.9* 06/29/2012   PLT 211 06/29/2012   GLUCOSE 134* 06/29/2012   CHOL 217* 06/12/2012   TRIG 399* 06/12/2012   HDL 32* 06/12/2012   LDLCALC 105* 06/12/2012   ALT 25 06/28/2012   AST 19 06/28/2012     NA 140 06/29/2012   K 3.6 06/29/2012   CL 106 06/29/2012   CREATININE 0.77 06/29/2012   BUN 13 06/29/2012   CO2 24 06/29/2012   TSH 2.826 06/28/2012   INR 0.95 06/28/2012   HGBA1C 10.5 06/12/2012   MICROALBUR 3.98* 08/23/2011     Assessment / Plan: 1. CAD with recent NSTEMI - grafts are patent. EF was normal. He is now on nitrate and Plavix therapy. He will need to postpone his colonoscopy for at least 3 and perhaps 6 months. He is not having any bowel issues and this was for screening purposes only.   2. Ongoing tobacco abuse - we have discussed in depth today. Has no insurance and not able to afford Chantix. I am not sure he is really ready to stop.  3. DM - uncontrolled. I have increased his Insulin. He is trying to get back in with his PCP for adjustments as well.   We will see him back in about 6 weeks. Will check BMET today. No change in his medicines.   Patient is agreeable to this plan and will call if any problems develop in the interim.

## 2012-07-18 ENCOUNTER — Ambulatory Visit (INDEPENDENT_AMBULATORY_CARE_PROVIDER_SITE_OTHER): Payer: Self-pay | Admitting: Internal Medicine

## 2012-07-18 ENCOUNTER — Encounter: Payer: Self-pay | Admitting: Internal Medicine

## 2012-07-18 VITALS — BP 119/71 | HR 63 | Temp 96.9°F | Ht 70.0 in | Wt 234.1 lb

## 2012-07-18 DIAGNOSIS — I10 Essential (primary) hypertension: Secondary | ICD-10-CM

## 2012-07-18 DIAGNOSIS — Z23 Encounter for immunization: Secondary | ICD-10-CM

## 2012-07-18 DIAGNOSIS — E119 Type 2 diabetes mellitus without complications: Secondary | ICD-10-CM

## 2012-07-18 DIAGNOSIS — Z951 Presence of aortocoronary bypass graft: Secondary | ICD-10-CM

## 2012-07-18 DIAGNOSIS — F17200 Nicotine dependence, unspecified, uncomplicated: Secondary | ICD-10-CM

## 2012-07-18 LAB — GLUCOSE, CAPILLARY: Glucose-Capillary: 119 mg/dL — ABNORMAL HIGH (ref 70–99)

## 2012-07-18 MED ORDER — BUPROPION HCL 75 MG PO TABS: 150.0000 mg | ORAL_TABLET | Freq: Two times a day (BID) | ORAL | Status: DC

## 2012-07-18 MED ORDER — INSULIN NPH ISOPHANE & REGULAR (70-30) 100 UNIT/ML ~~LOC~~ SUSP: 25.0000 [IU] | Freq: Two times a day (BID) | SUBCUTANEOUS | Status: DC

## 2012-07-18 NOTE — Patient Instructions (Signed)
Your blood sugars are still elevated today.  We are increasing your Novolog 70/30 insulin to 40 units every morning, and 25 units every evening.  Stopping smoking is one of the best things you can do for your body.  We are prescribing a medication called Bupropion.  Follow these instructions: 1. Set a quit date 2. Start the medication 1 week before your planned quit date 3. Start by taking 2 tablets, once per day for the first 3 days 4. Starting on Day 4, take 2 tablets twice per day 5. Continue this medication for a total of 3 months  Please return for a follow-up visit in 2-3 months

## 2012-07-18 NOTE — Progress Notes (Signed)
HPI The patient is a 56 y.o. yo male with a history of DM, HL, HTN, and NSTEMI, presenting for a hospital follow-up.  The patient was hospitalized 9/25-9/26 for NSTEMI.  Cath at that time showed evidence of his prior CABG, with patient vessels.  The patient is being managed medically, and has already followed up with cardiology.  He notes no recent chest pain or SOB.  The patient has a history of DM.  He had been taking Novolog 70/30, previously taking 33 (AM) and 20 (PM) units, but recently started taking 36 and 23 units on 07/13/12 at the advice of his cardiologist.  Since that time, the patient still notes some high blood sugar levels.  Glucometer report reveals that since that time, his blood sugars have been in the 120's-300's.  He notes that he gets symptoms of lightheadedness and blurry vision with hyperglycemia.  He notes no episodes of hypoglycemia, stating "I've never seen it below 100".  The patient's last eye exam was last April.  The patient has a history of smoking, currently smoking 1/2 pack/day.  In the past, he has tried quitting cold [place], and lasted 1 day.  He is interested in trying wellbutrin.  ROS: General: no fevers, chills, changes in weight, changes in appetite Skin: no rash HEENT: no blurry vision, hearing changes, sore throat Pulm: no dyspnea, coughing, wheezing CV: no chest pain, palpitations, shortness of breath Abd: no abdominal pain, nausea/vomiting, diarrhea/constipation GU: no dysuria, hematuria, polyuria Ext: no arthralgias, myalgias Neuro: no weakness, numbness, or tingling  Filed Vitals:   07/18/12 0903  BP: 119/71  Pulse: 63  Temp: 96.9 F (36.1 C)    PEX General: alert, cooperative, and in no apparent distress HEENT: pupils equal round and reactive to light, vision grossly intact, oropharynx clear and non-erythematous  Neck: supple, no lymphadenopathy Lungs: clear to ascultation bilaterally, normal work of respiration, no wheezes, rales,  ronchi Heart: regular rate and rhythm, no murmurs, gallops, or rubs Abdomen: soft, non-tender, non-distended, normal bowel sounds Extremities: no cyanosis, clubbing, or edema Neurologic: alert & oriented X3, cranial nerves II-XII intact, strength grossly intact, sensation intact to light touch  Current Outpatient Prescriptions on File Prior to Visit  Medication Sig Dispense Refill  . aspirin 81 MG tablet Take 81 mg by mouth daily.        . clopidogrel (PLAVIX) 75 MG tablet Take 1 tablet (75 mg total) by mouth daily.  30 tablet  6  . famotidine (PEPCID) 20 MG tablet Take 20 mg by mouth 2 (two) times daily.      . insulin NPH-insulin regular (NOVOLIN 70/30) (70-30) 100 UNIT/ML injection Inject 20-33 Units into the skin 2 (two) times daily with a meal. Inject 33 units into the skin in the morning and 20 units into the skin in the evening.      . isosorbide mononitrate (IMDUR) 30 MG 24 hr tablet Take 1 tablet (30 mg total) by mouth daily.  30 tablet  6  . lisinopril (PRINIVIL,ZESTRIL) 2.5 MG tablet Take 2.5 mg by mouth daily.      . metFORMIN (GLUCOPHAGE) 1000 MG tablet Take 1 tablet (1,000 mg total) by mouth 2 (two) times daily with a meal.  180 tablet  2  . metoprolol tartrate (LOPRESSOR) 25 MG tablet Take 1 tablet (25 mg total) by mouth 2 (two) times daily.  60 tablet  6  . nitroGLYCERIN (NITROSTAT) 0.4 MG SL tablet Place 1 tablet (0.4 mg total) under the tongue every 5 (five)  minutes as needed for chest pain.    12  . pravastatin (PRAVACHOL) 40 MG tablet Take 40 mg by mouth every evening.        Assessment/Plan

## 2012-07-18 NOTE — Assessment & Plan Note (Addendum)
The patient has a history of DM, with last A1C = 10.5 on 9/9.  Since that time, his Novolog 70/30 has been increased to 36 and 23 units (AM and PM).  However, glucometer report still reveals elevated blood sugars, with no hypoglycemia.  Patient notes compliance with his insulin and metformin. -continue metformin -increase Novolog 70/30 to 40 units qam, 25 units qpm -flu shot given today -patient reports pneumovax within the last 3 years, unconfirmed.  Will defer for now.

## 2012-07-18 NOTE — Assessment & Plan Note (Signed)
Patient follows with Cardiology, appreciate assistance. -continue BP, lipid, smoking modification

## 2012-07-18 NOTE — Assessment & Plan Note (Signed)
BP well-controlled today -continue current regimen 

## 2012-07-18 NOTE — Assessment & Plan Note (Signed)
The patient states that he is ready to quit smoking.  He has tried quitting cold-turkey in the past, with minimal success.  We discussed smoking cessation, including the use of nicotine replacement products, wellbutrin, and chantix, as well as meetings with our Child psychotherapist for tobacco cessation.  He is interested in trying Wellbutrin today. -start wellbutrin 1 week prior to quit date, uptitrate over 3 days to appropriate dose -continue this medication for 3 months, then taper off -patient to return for follow-up in 3 months to assess response

## 2012-07-27 ENCOUNTER — Encounter: Payer: Self-pay | Admitting: Gastroenterology

## 2012-08-15 NOTE — Addendum Note (Signed)
Addended by: Neomia Dear on: 08/15/2012 10:08 AM   Modules accepted: Orders

## 2012-08-23 ENCOUNTER — Encounter: Payer: Self-pay | Admitting: Dietician

## 2012-08-23 NOTE — Progress Notes (Signed)
Patient ID: Benjamin Baker, male   DOB: 1956/09/02, 56 y.o.   MRN: [id number] Documenting smoking cessation counseling provided at recent office visit

## 2012-08-24 ENCOUNTER — Other Ambulatory Visit: Payer: Self-pay | Admitting: *Deleted

## 2012-08-24 ENCOUNTER — Ambulatory Visit (INDEPENDENT_AMBULATORY_CARE_PROVIDER_SITE_OTHER): Payer: Self-pay | Admitting: Cardiology

## 2012-08-24 ENCOUNTER — Encounter: Payer: Self-pay | Admitting: Cardiology

## 2012-08-24 VITALS — BP 142/88 | HR 73 | Ht 69.0 in | Wt 228.8 lb

## 2012-08-24 DIAGNOSIS — Z951 Presence of aortocoronary bypass graft: Secondary | ICD-10-CM

## 2012-08-24 DIAGNOSIS — E119 Type 2 diabetes mellitus without complications: Secondary | ICD-10-CM

## 2012-08-24 DIAGNOSIS — E785 Hyperlipidemia, unspecified: Secondary | ICD-10-CM

## 2012-08-24 DIAGNOSIS — I10 Essential (primary) hypertension: Secondary | ICD-10-CM

## 2012-08-24 DIAGNOSIS — F17200 Nicotine dependence, unspecified, uncomplicated: Secondary | ICD-10-CM

## 2012-08-24 NOTE — Progress Notes (Signed)
Benjamin Baker Date of Birth: 1956-06-09 Medical Record #[id number]  History of Present Illness: Benjamin Baker is seen back today for a followup visit. He has known CAD with remote CABG 10 years ago. He had an NSTEMI in September. Grafts were patent by cath. He has ongoing tobacco abuse, DM, HTN, and HLD. He reports he has been doing well since his hospitalization in September. Yesterday he did rake leaves for over 3 hours and was doing a lot of lifting. With this he did have some sharp pain in his chest that was different from his prior cardiac pain. His insulin was increased on his last visit. He states his sugars are still running a little bit high. He has cut back on his smoking. He was given a prescription for smoking cessation but states he can't afford it. He is not exercising much.  Current Outpatient Prescriptions on File Prior to Visit  Medication Sig Dispense Refill  . aspirin 81 MG tablet Take 81 mg by mouth daily.        . clopidogrel (PLAVIX) 75 MG tablet Take 1 tablet (75 mg total) by mouth daily.  30 tablet  6  . famotidine (PEPCID) 20 MG tablet Take 20 mg by mouth 2 (two) times daily.      . isosorbide mononitrate (IMDUR) 30 MG 24 hr tablet Take 1 tablet (30 mg total) by mouth daily.  30 tablet  6  . lisinopril (PRINIVIL,ZESTRIL) 2.5 MG tablet Take 2.5 mg by mouth daily.      . metFORMIN (GLUCOPHAGE) 1000 MG tablet Take 1 tablet (1,000 mg total) by mouth 2 (two) times daily with a meal.  180 tablet  2  . metoprolol tartrate (LOPRESSOR) 25 MG tablet Take 1 tablet (25 mg total) by mouth 2 (two) times daily.  60 tablet  6  . nitroGLYCERIN (NITROSTAT) 0.4 MG SL tablet Place 1 tablet (0.4 mg total) under the tongue every 5 (five) minutes as needed for chest pain.    12  . pravastatin (PRAVACHOL) 40 MG tablet Take 40 mg by mouth every evening.        No Known Allergies  Past Medical History  Diagnosis Date  . Diabetes mellitus   . GERD (gastroesophageal reflux disease)   . CAD  (coronary artery disease)     a. 03/2002 - CABG x 3: LIMA > LAD, R Rad > OM2, SVG > PDA.    b. Cath 06/2012 - Significant three-vessel disease, 3/3 grafts patent. LVEF 60-65%   . Hypertension   . Hyperlipidemia   . Tobacco abuse     a. ongoing 06/2012  . NSTEMI (non-ST elevated myocardial infarction) September 2013    grafts patent; EF normal; manage medically    Past Surgical History  Procedure Date  . Coronary artery bypass graft 03/16/2002    a) x3: LIMA-LAD, R rad-Cx branch, SVG-PDA     History  Smoking status  . Current Every Day Smoker -- 0.5 packs/day for 30 years  . Types: Cigarettes  Smokeless tobacco  . Not on file    History  Alcohol Use  . Yes    Comment: rarely.    Family History  Problem Relation Age of Onset  . Insulin resistance Mother 70  . Colon cancer Father 68  . Heart attack Father 33    Review of Systems: The review of systems is per the HPI.  He has noted some tremor in his left hand. All other systems were reviewed and are  negative.  Physical Exam: BP 142/88  Pulse 73  Ht 5\' 9"  (1.753 m)  Wt 228 lb 12.8 oz (103.783 kg)  BMI 33.79 kg/m2  SpO2 97% Patient is a pleasant white male  in no acute distress. He is obese. Skin is warm and dry. Color is normal.  HEENT is unremarkable. Normocephalic/atraumatic. PERRL. Sclera are nonicteric. Neck is supple. No masses. No JVD. Lungs are clear. The chest wall pain to palpation. Cardiac exam shows a regular rate and rhythm. Abdomen is soft. Extremities are without edema. Gait and ROM are intact. No gross neurologic deficits noted.   LABORATORY DATA:   Assessment / Plan: 1. CAD STATUS post CABG - grafts are patent by cardiac catheterization in September. EF was normal. He is now on nitrate and Plavix therapy. We will continue with his current medical therapy. I've encouraged him to increase his aerobic activity. I recommended smoking cessation. Have cleared him to proceed with screening colonoscopy of the  first of the year. He may stop his Plavix for 5 days for this procedure.  2. Ongoing tobacco abuse - counseling given on smoking cessation.  3. DM - uncontrolled. Recommend follow up with his PCP.  4. Hypertension under fair control. Next  5. Hyperlipidemia on pravastatin.  6. Left hand tremor. Recommend follow up with his PCP.

## 2012-08-25 MED ORDER — LISINOPRIL 2.5 MG PO TABS: 2.5000 mg | ORAL_TABLET | Freq: Every day | ORAL | Status: DC

## 2012-08-25 NOTE — Telephone Encounter (Signed)
Called to [facility] 

## 2012-08-25 NOTE — Telephone Encounter (Signed)
Refill approved for current dose of lisinopril 2.5 mg daily.

## 2012-09-11 ENCOUNTER — Encounter: Payer: Self-pay | Admitting: Gastroenterology

## 2012-09-11 ENCOUNTER — Ambulatory Visit (INDEPENDENT_AMBULATORY_CARE_PROVIDER_SITE_OTHER): Payer: Self-pay | Admitting: Internal Medicine

## 2012-09-11 ENCOUNTER — Encounter: Payer: Self-pay | Admitting: Internal Medicine

## 2012-09-11 VITALS — BP 117/70 | HR 62 | Temp 97.3°F | Ht 70.0 in | Wt 231.1 lb

## 2012-09-11 DIAGNOSIS — E785 Hyperlipidemia, unspecified: Secondary | ICD-10-CM

## 2012-09-11 DIAGNOSIS — F17200 Nicotine dependence, unspecified, uncomplicated: Secondary | ICD-10-CM

## 2012-09-11 DIAGNOSIS — M25441 Effusion, right hand: Secondary | ICD-10-CM

## 2012-09-11 DIAGNOSIS — Z0000 Encounter for general adult medical examination without abnormal findings: Secondary | ICD-10-CM

## 2012-09-11 DIAGNOSIS — E119 Type 2 diabetes mellitus without complications: Secondary | ICD-10-CM

## 2012-09-11 DIAGNOSIS — Z79899 Other long term (current) drug therapy: Secondary | ICD-10-CM

## 2012-09-11 DIAGNOSIS — Z951 Presence of aortocoronary bypass graft: Secondary | ICD-10-CM

## 2012-09-11 DIAGNOSIS — M7989 Other specified soft tissue disorders: Secondary | ICD-10-CM

## 2012-09-11 LAB — GLUCOSE, CAPILLARY: Glucose-Capillary: 99 mg/dL (ref 70–99)

## 2012-09-11 MED ORDER — INSULIN NPH ISOPHANE & REGULAR (70-30) 100 UNIT/ML ~~LOC~~ SUSP: SUBCUTANEOUS | Status: DC

## 2012-09-11 NOTE — Progress Notes (Signed)
Subjective:   Patient ID: Benjamin Baker male   DOB: 26-Aug-1956 56 y.o.   MRN: [id number]  HPI: 56 year old man with past medical history significant for poorly controlled type 2 diabetes mellitus, coronary artery disease status post CABG and recent NTEMI in September of 2013, ongoing tobacco use presents to the clinic for a follow up visit.  He reports having throbbing pain in his right first knuckle for last 2 months. Also reports some swelling and redness associated with it .He doesn't remember any trauma to the hand.  DM: He just had few meter readings because he was running out of her strips. His blood sugars have been running in mostly 200s and 300s. He states that his fasting today was 135 but it was not seen on the meter. Denies any hypoglycemic events.  Tobacco use: He states that he has cut down on his smoking. He is currently smoking one cigarette a day. He also claims that he has switched to electronic cigarettes. He was prescribed Wellbutrin with the last visit but states that he couldn't afford it and therefore never got the medication.   Past Medical History  Diagnosis Date  . Diabetes mellitus   . GERD (gastroesophageal reflux disease)   . CAD (coronary artery disease)     a. 03/2002 - CABG x 3: LIMA > LAD, R Rad > OM2, SVG > PDA.    b. Cath 06/2012 - Significant three-vessel disease, 3/3 grafts patent. LVEF 60-65%   . Hypertension   . Hyperlipidemia   . Tobacco abuse     a. ongoing 06/2012  . NSTEMI (non-ST elevated myocardial infarction) September 2013    grafts patent; EF normal; manage medically   Family History  Problem Relation Age of Onset  . Insulin resistance Mother 83  . Colon cancer Father 48  . Heart attack Father 70   History   Social History  . Marital Status: Divorced    Spouse Name: N/A    Number of Children: N/A  . Years of Education: N/A   Occupational History  . Not on file.   Social History Main Topics  . Smoking status: Current Every Day  Smoker -- 0.5 packs/day for 30 years    Types: Cigarettes  . Smokeless tobacco: Not on file  . Alcohol Use: Yes     Comment: rarely.  . Drug Use: No  . Sexually Active: Not on file   Other Topics Concern  . Not on file   Social History Narrative  . No narrative on file   Review of Systems: General: Denies fever, chills, diaphoresis, appetite change and fatigue. HEENT: Denies photophobia, eye pain, redness, hearing loss, ear pain, congestion, sore throat, rhinorrhea, sneezing, mouth sores, trouble swallowing, neck pain, neck stiffness and tinnitus. Respiratory: Denies SOB, DOE, cough, chest tightness, and wheezing. Cardiovascular: Denies to chest pain, palpitations and leg swelling. Gastrointestinal: Denies nausea, vomiting, abdominal pain, diarrhea, constipation, blood in stool and abdominal distention. Genitourinary: Denies dysuria, urgency, frequency, hematuria, flank pain and difficulty urinating. Musculoskeletal: Denies myalgias, back pain, joint swelling, arthralgias and gait problem.  Skin: Denies pallor, rash and wound. Neurological: Denies dizziness, seizures, syncope, weakness, light-headedness, numbness and headaches. Hematological: Denies adenopathy, easy bruising, personal or family bleeding history. Psychiatric/Behavioral: Denies suicidal ideation, mood changes, confusion, nervousness, sleep disturbance and agitation.    Current Outpatient Medications: Current Outpatient Prescriptions  Medication Sig Dispense Refill  . aspirin 81 MG tablet Take 81 mg by mouth daily.        Marland Kitchen  clopidogrel (PLAVIX) 75 MG tablet Take 1 tablet (75 mg total) by mouth daily.  30 tablet  6  . famotidine (PEPCID) 20 MG tablet Take 20 mg by mouth 2 (two) times daily.      . insulin NPH-insulin regular (NOVOLIN 70/30) (70-30) 100 UNIT/ML injection Inject 30-unit into the skin at night and 45 units in the morning  10 mL  3  . isosorbide mononitrate (IMDUR) 30 MG 24 hr tablet Take 1 tablet (30 mg  total) by mouth daily.  30 tablet  6  . lisinopril (PRINIVIL,ZESTRIL) 2.5 MG tablet Take 1 tablet (2.5 mg total) by mouth daily.  30 tablet  1  . metFORMIN (GLUCOPHAGE) 1000 MG tablet Take 1 tablet (1,000 mg total) by mouth 2 (two) times daily with a meal.  180 tablet  2  . metoprolol tartrate (LOPRESSOR) 25 MG tablet Take 1 tablet (25 mg total) by mouth 2 (two) times daily.  60 tablet  6  . nitroGLYCERIN (NITROSTAT) 0.4 MG SL tablet Place 1 tablet (0.4 mg total) under the tongue every 5 (five) minutes as needed for chest pain.    12  . pravastatin (PRAVACHOL) 40 MG tablet Take 40 mg by mouth every evening.        Allergies: No Known Allergies    Objective:   Physical Exam: Filed Vitals:   09/11/12 1459  BP: 117/70  Pulse: 62  Temp: 97.3 F (36.3 C)    General: Vital signs reviewed and noted. Well-developed, well-nourished, in no acute distress; alert, appropriate and cooperative throughout examination. Head: Normocephalic, atraumatic Lungs: Normal respiratory effort. Clear to auscultation BL without crackles or wheezes. Heart: RRR. S1 and S2 normal without gallop, murmur, or rubs. Abdomen:BS normoactive. Soft, Nondistended, non-tender.  No masses or organomegaly. Extremities: No pretibial edema., first metacarpal of right hand has very mild redness and tenderness, no warmth    Assessment & Plan:

## 2012-09-11 NOTE — Patient Instructions (Signed)
General Instructions:    Treatment Goals:  Goals (1 Years of Data) as of 09/11/2012          As of Today 06/12/12 08/23/11     Lifestyle    . Quit smoking / using tobacco         Result Component    . HEMOGLOBIN A1C < 7.0  9.2 10.5 8.7    . LDL CALC < 100   105 147      Progress Toward Treatment Goals:  Treatment Goal 09/11/2012  Hemoglobin A1C improved  Blood pressure at goal  Stop smoking smoking less    Self Care Goals & Plans:  Self Care Goal 09/11/2012  Manage my medications take my medicines as prescribed  Monitor my health keep track of my blood glucose; bring my glucose meter and log to each visit; keep track of my blood pressure; bring my blood pressure log to each visit; keep track of my weight  Eat healthy foods drink diet soda or water instead of juice or soda; eat more vegetables; eat baked foods instead of fried foods; eat foods that are low in salt  Stop smoking call [facility] (1-800-QUIT-NOW); go to the [employer] ([employer]); attend a smoking cessation class    Home Blood Glucose Monitoring 09/11/2012  Check my blood sugar 3 times a day     Care Management & Community Referrals:  Referral 09/11/2012  Referrals made for care management support none needed

## 2012-09-12 NOTE — Assessment & Plan Note (Signed)
Denies any chest pain. Continue current medical therapy. Of note, he was not taking his Plavix for last 1 month in anticipation of his colonoscopy. Patient was educated that he just needs to stop Plavix 5-7 days before the procedure. He voices understanding and states that he would start taking that again. Apparently his colonoscopy has been scheduled for 09/25/2012. So, he was advised to resume taking plavix and stop it 7 days prior to the procedure.

## 2012-09-12 NOTE — Assessment & Plan Note (Signed)
A1c has improved from 10.5 in 09/09 to 9.2 on 12/9. He claims that he takes his insulin regularly (40 units in the morning and 25 units in the evening). His CBG log was reviewed with his blood sugars ranging in  200s to 300s before lunch and dinner. He states that his fasting blood sugar was 135 today. Denies any hypoglycemic events. Would increase his NovoLog from 40 units to 45 units in the morning and from 25-30 units in the evening.  -Continue metformin at current dose. -We discussed diet and weight reduction to get better control of  his diabetes.  - He reports that he has got Pneumovax in the last 3 years. - Foot exam was done today  - He already had an ophthalmology referral placed in the past. Would followup on that .

## 2012-09-12 NOTE — Assessment & Plan Note (Signed)
Patient reports isolated throbbing pain in the second metacarpal joint of his right hand. On exam he was noticed to have very mild redness and swelling. Etiology is unclear but could be related to some accidental unknown trauma. -He was advised to use ice or heat -Ibuprofen or Tylenol for pain

## 2012-09-12 NOTE — Assessment & Plan Note (Signed)
Continue pravastatin at current dose 

## 2012-09-12 NOTE — Assessment & Plan Note (Signed)
Patient reports that he has his colonoscopy scheduled for 09/25/2012. Would followup on report. -We are working on his ophthalmology referral

## 2012-09-12 NOTE — Assessment & Plan Note (Signed)
He  states that he has switched to the electronic cigarettesand now has been smoking 1 cigarette a day. He was counseled on tobacco abuse. He was given the number for 1-800- QUIT -NOW. Medication affordability to help with smoking cessation continues to be an issue. He did not refill his Wellbutrin that was prescribed with last visit.

## 2012-09-14 ENCOUNTER — Telehealth: Payer: Self-pay | Admitting: *Deleted

## 2012-09-14 ENCOUNTER — Ambulatory Visit (AMBULATORY_SURGERY_CENTER): Payer: Self-pay | Admitting: *Deleted

## 2012-09-14 VITALS — Ht 69.0 in | Wt 229.8 lb

## 2012-09-14 DIAGNOSIS — Z1211 Encounter for screening for malignant neoplasm of colon: Secondary | ICD-10-CM

## 2012-09-14 MED ORDER — MOVIPREP 100 G PO SOLR: ORAL | Status: DC

## 2012-09-14 NOTE — Telephone Encounter (Signed)
Dr Christella Hartigan: pt is scheduled for direct screening colonoscopy 12/23 at [facility].  Pt had MI September 2013.  EF= 60-65%.  Pt is on Plavix (CABG 10 years ago).  Pt saw Dr. Dorthula Rue 12/9 and Dr. Sw[place]and 11/21.  Stopping Plavix before colonoscopy was okayed by both MD. (Notes are in EPIC.)  Is pt okay for direct colonoscopy? Thanks, Olegario Messier

## 2012-09-14 NOTE — Telephone Encounter (Signed)
Yes, his cardiologist "cleared him for colonoscopy and holding the plavix for 5 days prior to procedure" on his 08/2012 office note.

## 2012-09-25 ENCOUNTER — Ambulatory Visit (AMBULATORY_SURGERY_CENTER): Payer: Self-pay | Admitting: Gastroenterology

## 2012-09-25 ENCOUNTER — Encounter: Payer: Self-pay | Admitting: Gastroenterology

## 2012-09-25 VITALS — BP 128/67 | HR 60 | Temp 96.7°F | Resp 23 | Ht 69.0 in | Wt 229.0 lb

## 2012-09-25 DIAGNOSIS — Z800 Family history of malignant neoplasm of digestive organs: Secondary | ICD-10-CM

## 2012-09-25 DIAGNOSIS — Z1211 Encounter for screening for malignant neoplasm of colon: Secondary | ICD-10-CM

## 2012-09-25 DIAGNOSIS — D126 Benign neoplasm of colon, unspecified: Secondary | ICD-10-CM

## 2012-09-25 LAB — GLUCOSE, CAPILLARY: Glucose-Capillary: 151 mg/dL — ABNORMAL HIGH (ref 70–99)

## 2012-09-25 MED ORDER — SODIUM CHLORIDE 0.9 % IV SOLN: 500.0000 mL | INTRAVENOUS | Status: DC

## 2012-09-25 NOTE — Progress Notes (Signed)
Patient did not experience any of the following events: a burn prior to discharge; a fall within the facility; wrong site/side/patient/procedure/implant event; or a hospital transfer or hospital admission upon discharge from the facility. (G8907) Patient did not have preoperative order for IV antibiotic SSI prophylaxis. (G8918)  

## 2012-09-25 NOTE — Op Note (Signed)
[facility] [address] [place], [address]   COLONOSCOPY PROCEDURE REPORT  PATIENT: Benjamin Baker, Benjamin Baker  MR#: [id number] BIRTHDATE: [birth year] , 56  yrs. old GENDER: Male ENDOSCOPIST: Rachael Fee, MD REFERRED XB:JYNWG Sawhney, M.D. PROCEDURE DATE:  09/25/2012 PROCEDURE:   Colonoscopy with snare polypectomy ASA CLASS:   Class III INDICATIONS:elevated risk screening, father had colon cancer MEDICATIONS: Fentanyl 50 mcg IV, Versed 5 mg IV, and These medications were titrated to patient response per physician's verbal order  DESCRIPTION OF PROCEDURE:   After the risks benefits and alternatives of the procedure were thoroughly explained, informed consent was obtained.  A digital rectal exam revealed no abnormalities of the rectum.   The Fuse-Demo-Scope  endoscope was introduced through the anus and advanced to the cecum, which was identified by both the appendix and ileocecal valve. No adverse events experienced.   The quality of the prep was good, using MoviPrep  The instrument was then slowly withdrawn as the colon was fully examined.   COLON FINDINGS: Images taken but only available in hard copy form that will be scanned into EPIC [employer]).   Five sessile polyps were found, all were removed and all were sent to pathology.  These ranged in size from 3mm to 7mm, were located in ascending, hepatic flexure, descending and sigmoid segments.  They were all removed with cold snare, path jar 1.  The examination was otherwise normal. Retroflexed views revealed no abnormalities. The time to cecum=3 minutes 59 seconds.  Withdrawal time=20 minutes 10 seconds.  The scope was withdrawn and the procedure completed. COMPLICATIONS: There were no complications.  ENDOSCOPIC IMPRESSION: 1.   Images taken but only available in hard copy form that will be scanned into EPIC [employer]). 2.   Five sessile polyps were found, all were removed and all were sent to  pathology. 3.   The examination was otherwise normal.  RECOMMENDATIONS: If the polyp(s) removed today are proven to be adenomatous (pre-cancerous) polyps, you will need a colonoscopy in 3 years. Otherwise you will need repeat examination in 5 years due to family history of colon cancer.  You will receive a letter within 1-2 weeks with the results of your biopsy as well as final recommendations.  Please call my office if you have not received a letter after 3 weeks.   eSigned:  Rachael Fee, MD 09/25/2012 11:28 AM   cc:

## 2012-09-25 NOTE — Progress Notes (Signed)
Danae Orleans and Herschel Senegal, Endo choice representatives in the room during procedure.

## 2012-09-25 NOTE — Patient Instructions (Addendum)
One of your biggest health concerns is your smoking.  This increases your risk for most cancers and serious cardiovascular diseases such as strokes, heart attacks.  You should try your best to stop.  If you need assistance, please contact your PCP or Smoking Cessation Class at [facility] [phone]) or [facility] (1-800-QUIT-NOW). You can restart plavix tomorrow. Discharge instructions given with verbal understanding. Handout on polyps given. Resume previous medications. YOU HAD AN ENDOSCOPIC PROCEDURE TODAY AT THE [facility]: Refer to the procedure report that was given to you for any specific questions about what was found during the examination.  If the procedure report does not answer your questions, please call your gastroenterologist to clarify.  If you requested that your care partner not be given the details of your procedure findings, then the procedure report has been included in a sealed envelope for you to review at your convenience later.  YOU SHOULD EXPECT: Some feelings of bloating in the abdomen. Passage of more gas than usual.  Walking can help get rid of the air that was put into your GI tract during the procedure and reduce the bloating. If you had a lower endoscopy (such as a colonoscopy or flexible sigmoidoscopy) you may notice spotting of blood in your stool or on the toilet paper. If you underwent a bowel prep for your procedure, then you may not have a normal bowel movement for a few days.  DIET: Your first meal following the procedure should be a light meal and then it is ok to progress to your normal diet.  A half-sandwich or bowl of soup is an example of a good first meal.  Heavy or fried foods are harder to digest and may make you feel nauseous or bloated.  Likewise meals heavy in dairy and vegetables can cause extra gas to form and this can also increase the bloating.  Drink plenty of fluids but you should avoid alcoholic beverages for 24  hours.  ACTIVITY: Your care partner should take you home directly after the procedure.  You should plan to take it easy, moving slowly for the rest of the day.  You can resume normal activity the day after the procedure however you should NOT DRIVE or use heavy machinery for 24 hours (because of the sedation medicines used during the test).    SYMPTOMS TO REPORT IMMEDIATELY: A gastroenterologist can be reached at any hour.  During normal business hours, 8:30 AM to 5:00 PM Monday through Friday, call [phone].  After hours and on weekends, please call the GI answering service at [phone] who will take a message and have the physician on call contact you.   Following lower endoscopy (colonoscopy or flexible sigmoidoscopy):  Excessive amounts of blood in the stool  Significant tenderness or worsening of abdominal pains  Swelling of the abdomen that is new, acute  Fever of 100F or higher  FOLLOW UP: If any biopsies were taken you will be contacted by phone or by letter within the next 1-3 weeks.  Call your gastroenterologist if you have not heard about the biopsies in 3 weeks.  Our staff will call the home number listed on your records the next business day following your procedure to check on you and address any questions or concerns that you may have at that time regarding the information given to you following your procedure. This is a courtesy call and so if there is no answer at the home number and we have not  heard from you through the emergency physician on call, we will assume that you have returned to your regular daily activities without incident.  SIGNATURES/CONFIDENTIALITY: You and/or your care partner have signed paperwork which will be entered into your electronic medical record.  These signatures attest to the fact that that the information above on your After Visit Summary has been reviewed and is understood.  Full responsibility of the confidentiality of this discharge  information lies with you and/or your care-partner.

## 2012-09-26 ENCOUNTER — Telehealth: Payer: Self-pay | Admitting: *Deleted

## 2012-09-26 ENCOUNTER — Encounter: Payer: Self-pay | Admitting: Gastroenterology

## 2012-09-26 NOTE — Telephone Encounter (Signed)
  Follow up Call-  Call back number 09/25/2012  Post procedure Call Back phone  # [phone]  Permission to leave phone message Yes     Patient questions:  Do you have a fever, pain , or abdominal swelling? no Pain Score  0 *  Have you tolerated food without any problems? yes  Have you been able to return to your normal activities? yes  Do you have any questions about your discharge instructions: Diet   no Medications  no Follow up visit  no  Do you have questions or concerns about your Care? no  Actions: * If pain score is 4 or above: No action needed, pain <4.

## 2012-10-06 ENCOUNTER — Encounter: Payer: Self-pay | Admitting: Gastroenterology

## 2012-12-20 ENCOUNTER — Other Ambulatory Visit: Payer: Self-pay | Admitting: *Deleted

## 2012-12-20 DIAGNOSIS — E119 Type 2 diabetes mellitus without complications: Secondary | ICD-10-CM

## 2012-12-20 MED ORDER — INSULIN NPH ISOPHANE & REGULAR (70-30) 100 UNIT/ML ~~LOC~~ SUSP: SUBCUTANEOUS | Status: DC

## 2012-12-20 MED ORDER — METFORMIN HCL 1000 MG PO TABS: 1000.0000 mg | ORAL_TABLET | Freq: Two times a day (BID) | ORAL | Status: DC

## 2012-12-21 NOTE — Telephone Encounter (Signed)
Called to pharm 

## 2012-12-29 ENCOUNTER — Other Ambulatory Visit: Payer: Self-pay | Admitting: Internal Medicine

## 2012-12-29 DIAGNOSIS — E119 Type 2 diabetes mellitus without complications: Secondary | ICD-10-CM

## 2013-01-03 ENCOUNTER — Ambulatory Visit: Payer: Self-pay | Admitting: Dietician

## 2013-01-03 ENCOUNTER — Encounter: Payer: Self-pay | Admitting: Radiation Oncology

## 2013-01-03 ENCOUNTER — Ambulatory Visit (INDEPENDENT_AMBULATORY_CARE_PROVIDER_SITE_OTHER): Payer: No Typology Code available for payment source | Admitting: Radiation Oncology

## 2013-01-03 VITALS — BP 142/79 | HR 82 | Temp 96.7°F | Ht 70.0 in | Wt 221.7 lb

## 2013-01-03 DIAGNOSIS — F17200 Nicotine dependence, unspecified, uncomplicated: Secondary | ICD-10-CM

## 2013-01-03 DIAGNOSIS — I10 Essential (primary) hypertension: Secondary | ICD-10-CM

## 2013-01-03 DIAGNOSIS — E119 Type 2 diabetes mellitus without complications: Secondary | ICD-10-CM

## 2013-01-03 DIAGNOSIS — Z951 Presence of aortocoronary bypass graft: Secondary | ICD-10-CM

## 2013-01-03 DIAGNOSIS — E785 Hyperlipidemia, unspecified: Secondary | ICD-10-CM

## 2013-01-03 DIAGNOSIS — Z0000 Encounter for general adult medical examination without abnormal findings: Secondary | ICD-10-CM

## 2013-01-03 DIAGNOSIS — I252 Old myocardial infarction: Secondary | ICD-10-CM

## 2013-01-03 DIAGNOSIS — I214 Non-ST elevation (NSTEMI) myocardial infarction: Secondary | ICD-10-CM

## 2013-01-03 LAB — POCT GLYCOSYLATED HEMOGLOBIN (HGB A1C): Hemoglobin A1C: 10.5

## 2013-01-03 MED ORDER — METFORMIN HCL 1000 MG PO TABS: 1000.0000 mg | ORAL_TABLET | Freq: Two times a day (BID) | ORAL | Status: DC

## 2013-01-03 MED ORDER — METOPROLOL TARTRATE 25 MG PO TABS: 25.0000 mg | ORAL_TABLET | Freq: Two times a day (BID) | ORAL | Status: DC

## 2013-01-03 MED ORDER — CLOPIDOGREL BISULFATE 75 MG PO TABS: 75.0000 mg | ORAL_TABLET | Freq: Every day | ORAL | Status: DC

## 2013-01-03 MED ORDER — LISINOPRIL 2.5 MG PO TABS: 2.5000 mg | ORAL_TABLET | Freq: Every day | ORAL | Status: DC

## 2013-01-03 MED ORDER — FAMOTIDINE 20 MG PO TABS: 20.0000 mg | ORAL_TABLET | Freq: Two times a day (BID) | ORAL | Status: DC

## 2013-01-03 MED ORDER — ISOSORBIDE MONONITRATE ER 30 MG PO TB24: 30.0000 mg | ORAL_TABLET | Freq: Every day | ORAL | Status: DC

## 2013-01-03 MED ORDER — PRAVASTATIN SODIUM 40 MG PO TABS: 40.0000 mg | ORAL_TABLET | Freq: Every evening | ORAL | Status: DC

## 2013-01-03 NOTE — Patient Instructions (Addendum)
Please take all your medications as prescribed. Please be sure to always take your insulin as prescribed and please contact us before you run out of your insulin so it can be refilled without interruption in your regimen. Contact the clinic or go to the ED if you have any worsened left leg redness or any new swelling or increased warmth.

## 2013-01-03 NOTE — Progress Notes (Signed)
  Subjective:    Patient ID: Benjamin Baker, male    DOB: Oct 07, 1955, 57 y.o.   MRN: [id number]  HPI Patient is a 57 year old man with PMH significant for uncontrolled type 2 diabetes mellitus, CAD s/p CABG ~10 yrs ago, NSTEMI in 06/2012 (other PMH per below) who presents to clinic today with complaints of left lower extremity redness after injuring his left leg on a piece of furniture. He denies any increased warmth in the area. He denies any increased swelling in the LLE as compared to the RLE, and has mild edema in the BLE's at baseline. He states that the injury occurred approximately one week ago, and that the redness has been present for the last several days with possible slight worsening over that period of time. Denies fever/chills. No other complaints today.  The patient requests refills of multiple medications during his visit today, and states the only medication he has been taking regularly is his aspirin. He states he has been out of his insulin for close to a month prior to the last week when he finally had it refilled. He brought his glucose meter to his visit today, which revealed he checked his blood sugar only several times over the last month, which he states is secondary to lack of finances for testing strips.   Review of Systems  All other systems reviewed and are negative.       Objective:   Physical Exam  Constitutional: He is oriented to person, place, and time. He appears well-developed and well-nourished. No distress.  HENT:  Head: Normocephalic and atraumatic.  Eyes: Conjunctivae are normal. Pupils are equal, round, and reactive to light. No scleral icterus.  Neck: Normal range of motion. Neck supple. No tracheal deviation present.  Cardiovascular: Normal rate and regular rhythm.   No murmur heard. Pulmonary/Chest: Effort normal. He has no wheezes. He has no rales.  Abdominal: Soft. Bowel sounds are normal. He exhibits no distension. There is no tenderness.    Musculoskeletal: Normal range of motion. He exhibits no edema.  Neurological: He is alert and oriented to person, place, and time. No cranial nerve deficit.  Skin: Skin is warm and dry.  Mild erythema surrounding small abrasions in the mid-pretibial area of the LLE. Mild TTP. No increased warmth. No asymmetric edema (1+ pitting edema in BLE). No drainage.   Psychiatric: He has a normal mood and affect. His behavior is normal.            Assessment & Plan:

## 2013-01-04 LAB — MICROALBUMIN / CREATININE URINE RATIO
Creatinine, Urine: 285 mg/dL
Microalb Creat Ratio: 23.1 mg/g (ref 0.0–30.0)
Microalb, Ur: 6.57 mg/dL — ABNORMAL HIGH (ref 0.00–1.89)

## 2013-01-04 NOTE — Assessment & Plan Note (Addendum)
Lab Results  Component Value Date   HGBA1C 10.5 01/03/2013   HGBA1C 9.2 09/11/2012   HGBA1C 10.5 06/12/2012     Assessment:  Diabetes control: poor control (HgbA1C >9%)  Progress toward A1C goal:  deteriorated  Comments: worsened glycemic control recently secondary to insulin noncompliance  Plan:  Medications:  continue current medications  Home glucose monitoring:   Frequency:     Timing:    Instruction/counseling given: reminded to bring blood glucose meter & log to each visit  Educational resources provided: brochure  Self management tools provided: copy of home glucose meter download  Other plans: no changes made in insulin regimen today, as the patient has not been compliant with the regimen he is already prescribed which is the most likely reason for his worsened A1c today. He was counseled extensively as to the importance of taking his insulin as prescribed, and states he will be doing as such going forward as he feels she will be able to find a way to afford this. He was also counseled on the importance of checking his glucose regularly and states he will also be able to afford test strips in the future if he makes this a priority.

## 2013-01-04 NOTE — Assessment & Plan Note (Addendum)
  Assessment:  Progress toward smoking cessation:  smoking the same amount  Barriers to progress toward smoking cessation:     Comments: Plan:  Instruction/counseling given:  I counseled patient on the dangers of tobacco use.  Educational resources provided:     Self management tools provided:     Medications to assist with smoking cessation:   Patient agreed to the following self-care plans for smoking cessation:  go to the [employer] (www.quitlinenc.com);call [facility] (1-800-QUIT-NOW)   Other: counseled extensively on the importance of smoking cessation. Patient states he is currently smoking approximately 1/2 pack per day, but states she will try to continue to decrease his cigarette use. He has not committed to full cessation at this time, however

## 2013-01-04 NOTE — Assessment & Plan Note (Signed)
BP Readings from Last 3 Encounters:  01/03/13 142/79  09/25/12 128/67  09/11/12 117/70    Lab Results  Component Value Date   NA 136 07/13/2012   K 4.6 07/13/2012   CREATININE 0.9 07/13/2012    Assessment:  Blood pressure control: mildly elevated  Progress toward BP goal:  deteriorated  Comments: Slightly elevated BP today, likely due to med noncompliance.  Plan:  Medications:  continue current medications  Educational resources provided:    Self management tools provided:    Other plans: importance of compliance with medications stressed during visit.

## 2013-01-04 NOTE — Assessment & Plan Note (Addendum)
Pt had colonoscopy in 09/2012 with removal of 5 polyps. Surgical pathology unavailable in EMR, thus unclear as to whether pt may need repeat colonoscopy in 3 yrs or can f/u at a longer interval. Will defer to PCP.

## 2013-01-04 NOTE — Assessment & Plan Note (Signed)
Patient was instructed to continue both Plavix and aspirin on his previous visit with cardiology in 08/2012, several months after his NSTEMI. He states he has great difficulty affording his Plavix, however he will attempt to take this medication until told to do otherwise by his cardiologist. - cont ASA - cont plavix - cont NTG

## 2013-01-04 NOTE — Assessment & Plan Note (Signed)
Instructed to continue pravastatin, as he has not been taking this medication regularly over the last few months.   Lab Results  Component Value Date   LDLCALC 105* 06/12/2012

## 2013-01-04 NOTE — Assessment & Plan Note (Deleted)
Patient was instructed to continue both Plavix and aspirin on his previous visit with cardiology in 08/2012, several months after his NSTEMI. He states he has great difficulty affording his Plavix, however he will attempt to take this medication until told to do otherwise by his cardiologist. - cont ASA - cont plavix - cont NTG 

## 2013-01-08 NOTE — Progress Notes (Signed)
Case discussed with Dr. McTyre at time of visit.  We reviewed the resident's history and exam and pertinent patient test results.  I agree with the assessment, diagnosis, and plan of care documented in the resident's note. 

## 2013-02-13 ENCOUNTER — Encounter: Payer: Self-pay | Admitting: Dietician

## 2013-03-14 ENCOUNTER — Encounter: Payer: Self-pay | Admitting: Dietician

## 2013-03-29 ENCOUNTER — Telehealth: Payer: Self-pay

## 2013-03-29 NOTE — Telephone Encounter (Signed)
REC'D FAX INFO FROM [facility].  This patient has been approved for a free eye exam.  An approval letter was sent to the patient and he should receive the contact information for the volunteer ophthalmologist in 1-2 weeks.  Upon receiving the volunteer contact information, the patient will nee to contact the provided practice to schedule an appointment.  If you have any questions please contact Sindy Guadeloupe at [phone]

## 2013-04-10 ENCOUNTER — Other Ambulatory Visit: Payer: Self-pay | Admitting: Internal Medicine

## 2013-04-10 DIAGNOSIS — E119 Type 2 diabetes mellitus without complications: Secondary | ICD-10-CM

## 2013-04-12 ENCOUNTER — Other Ambulatory Visit: Payer: Self-pay

## 2013-05-09 ENCOUNTER — Emergency Department (HOSPITAL_COMMUNITY): Payer: No Typology Code available for payment source

## 2013-05-09 ENCOUNTER — Encounter (HOSPITAL_COMMUNITY): Payer: Self-pay | Admitting: *Deleted

## 2013-05-09 ENCOUNTER — Encounter (HOSPITAL_COMMUNITY): Payer: Self-pay | Admitting: Emergency Medicine

## 2013-05-09 ENCOUNTER — Emergency Department (HOSPITAL_COMMUNITY)
Admission: EM | Admit: 2013-05-09 | Discharge: 2013-05-09 | Disposition: A | Discharge status: Home / Self Care | Payer: No Typology Code available for payment source | Attending: Emergency Medicine | Admitting: Emergency Medicine

## 2013-05-09 DIAGNOSIS — E119 Type 2 diabetes mellitus without complications: Secondary | ICD-10-CM | POA: Insufficient documentation

## 2013-05-09 DIAGNOSIS — R109 Unspecified abdominal pain: Secondary | ICD-10-CM | POA: Insufficient documentation

## 2013-05-09 DIAGNOSIS — Z951 Presence of aortocoronary bypass graft: Secondary | ICD-10-CM | POA: Insufficient documentation

## 2013-05-09 DIAGNOSIS — Z87442 Personal history of urinary calculi: Secondary | ICD-10-CM | POA: Insufficient documentation

## 2013-05-09 DIAGNOSIS — I252 Old myocardial infarction: Secondary | ICD-10-CM | POA: Insufficient documentation

## 2013-05-09 DIAGNOSIS — Z79899 Other long term (current) drug therapy: Secondary | ICD-10-CM | POA: Insufficient documentation

## 2013-05-09 DIAGNOSIS — R079 Chest pain, unspecified: Secondary | ICD-10-CM | POA: Insufficient documentation

## 2013-05-09 DIAGNOSIS — I2510 Atherosclerotic heart disease of native coronary artery without angina pectoris: Secondary | ICD-10-CM | POA: Insufficient documentation

## 2013-05-09 DIAGNOSIS — Z8639 Personal history of other endocrine, nutritional and metabolic disease: Secondary | ICD-10-CM | POA: Insufficient documentation

## 2013-05-09 DIAGNOSIS — N139 Obstructive and reflux uropathy, unspecified: Secondary | ICD-10-CM | POA: Insufficient documentation

## 2013-05-09 DIAGNOSIS — N200 Calculus of kidney: Secondary | ICD-10-CM | POA: Insufficient documentation

## 2013-05-09 DIAGNOSIS — Z862 Personal history of diseases of the blood and blood-forming organs and certain disorders involving the immune mechanism: Secondary | ICD-10-CM | POA: Insufficient documentation

## 2013-05-09 DIAGNOSIS — Z794 Long term (current) use of insulin: Secondary | ICD-10-CM | POA: Insufficient documentation

## 2013-05-09 DIAGNOSIS — Z8719 Personal history of other diseases of the digestive system: Secondary | ICD-10-CM | POA: Insufficient documentation

## 2013-05-09 DIAGNOSIS — F17200 Nicotine dependence, unspecified, uncomplicated: Secondary | ICD-10-CM | POA: Insufficient documentation

## 2013-05-09 DIAGNOSIS — I10 Essential (primary) hypertension: Secondary | ICD-10-CM | POA: Insufficient documentation

## 2013-05-09 DIAGNOSIS — E785 Hyperlipidemia, unspecified: Secondary | ICD-10-CM | POA: Insufficient documentation

## 2013-05-09 DIAGNOSIS — R0602 Shortness of breath: Secondary | ICD-10-CM | POA: Insufficient documentation

## 2013-05-09 HISTORY — DX: Calculus of kidney: N20.0

## 2013-05-09 LAB — HEPATIC FUNCTION PANEL
ALT: 15 U/L (ref 0–53)
Bilirubin, Direct: <0.1 mg/dL (ref 0.0–0.3)

## 2013-05-09 LAB — URINALYSIS, ROUTINE W REFLEX MICROSCOPIC
Protein, ur: 30 mg/dL — AB
Urobilinogen, UA: 1 mg/dL (ref 0.0–1.0)

## 2013-05-09 LAB — CBC WITH DIFFERENTIAL/PLATELET
Basophils Absolute: 0 10*3/uL (ref 0.0–0.1)
Basophils Relative: 0 % (ref 0–1)
Eosinophils Relative: 2 % (ref 0–5)
HCT: 40 % (ref 39.0–52.0)
MCH: 30.9 pg (ref 26.0–34.0)
MCHC: 34.8 g/dL (ref 30.0–36.0)
MCV: 88.9 fL (ref 78.0–100.0)
Monocytes Absolute: 0.8 10*3/uL (ref 0.1–1.0)
RDW: 13.1 % (ref 11.5–15.5)

## 2013-05-09 LAB — URINE MICROSCOPIC-ADD ON

## 2013-05-09 LAB — POCT I-STAT, CHEM 8
Calcium, Ion: 1.07 mmol/L — ABNORMAL LOW (ref 1.12–1.23)
Chloride: 103 mEq/L (ref 96–112)
HCT: 42 % (ref 39.0–52.0)
Potassium: 3.8 mEq/L (ref 3.5–5.1)

## 2013-05-09 LAB — POCT I-STAT TROPONIN I
Troponin i, poc: 0 ng/mL (ref 0.00–0.08)
Troponin i, poc: 0.02 ng/mL (ref 0.00–0.08)

## 2013-05-09 MED ORDER — HYDROMORPHONE HCL PF 1 MG/ML IJ SOLN
1.0000 mg | Freq: Once | INTRAMUSCULAR | Status: AC
  Administered 2013-05-09: 1 mg via INTRAVENOUS
  Filled 2013-05-09: qty 1

## 2013-05-09 MED ORDER — ONDANSETRON 4 MG PO TBDP: 4.0000 mg | ORAL_TABLET | Freq: Three times a day (TID) | ORAL | Status: DC | PRN

## 2013-05-09 MED ORDER — ONDANSETRON HCL 4 MG/2ML IJ SOLN
INTRAMUSCULAR | Status: AC
  Administered 2013-05-09: 4 mg via INTRAVENOUS
  Filled 2013-05-09: qty 2

## 2013-05-09 MED ORDER — TAMSULOSIN HCL 0.4 MG PO CAPS: 0.4000 mg | ORAL_CAPSULE | Freq: Every day | ORAL | Status: DC

## 2013-05-09 MED ORDER — ONDANSETRON HCL 4 MG/2ML IJ SOLN: 4.0000 mg | Freq: Once | INTRAMUSCULAR | Status: AC

## 2013-05-09 MED ORDER — OXYCODONE-ACETAMINOPHEN 5-325 MG PO TABS: 1.0000 | ORAL_TABLET | ORAL | Status: DC | PRN

## 2013-05-09 MED ORDER — FENTANYL CITRATE 0.05 MG/ML IJ SOLN
50.0000 ug | Freq: Once | INTRAMUSCULAR | Status: AC
  Administered 2013-05-09: 50 ug via INTRAVENOUS
  Filled 2013-05-09: qty 2

## 2013-05-09 MED ORDER — KETOROLAC TROMETHAMINE 30 MG/ML IJ SOLN
30.0000 mg | Freq: Once | INTRAMUSCULAR | Status: AC
  Administered 2013-05-09: 30 mg via INTRAVENOUS
  Filled 2013-05-09: qty 1

## 2013-05-09 MED ORDER — FENTANYL CITRATE 0.05 MG/ML IJ SOLN
50.0000 ug | Freq: Once | INTRAMUSCULAR | Status: AC
  Administered 2013-05-09: 50 ug via INTRAVENOUS

## 2013-05-09 NOTE — ED Notes (Addendum)
Here by EMS from home for CP, some sob with onset of CP, h/o htn, DM and MI, pain woke pt up, started in low back went to L groin then L chest, L lower back iss most bothersome, "feels like last MI", ntg and ASA given PTA, NSL started PTA, (denies: nv, diaphoresis), NSR on monitor, cbg 187. COP onset 0315, rates 10/10 pain, no change with ntg.

## 2013-05-09 NOTE — ED Notes (Signed)
Reports h/o distant kidney stones.

## 2013-05-09 NOTE — ED Provider Notes (Signed)
CSN: [id number]     Arrival date & time 05/09/13  0444 History     None    Chief Complaint  Patient presents with  . Chest Pain  . Back Pain  . Groin Pain   (Consider location/radiation/quality/duration/timing/severity/associated sxs/prior Treatment) The history is provided by the patient and medical records.   Pt presents to the ED with multiple complaints.  Pt states pain started in his left lower back upon waking this morning when he went to use the bathroom.  Pain described as a constant, sharp, stabbing pain that radiates to his left groin.  No pain in the testicles.  Denies any dysuria or hematuria.  No difficulty initiating urinary stream or passing urine. Remote hx of kidney stones, none for quite some time.  Pt does not have a urologist.  No meds taken PTA.  Also notes some left sided chest pain.  Described as an intermittent intense, stabbing pain associated with some intermittent SOB.  Pt states "it may have just been because my back pain was taking my breath away."  Denies any palpitations, LE edema, calf pain or recent travel.  No numbness or paresthesias of extremities, dizziness, weakness, or diaphoresis.  Prior CABG and NSTEMI.  Cardiac cath 06/28/12 with significant 3 vessel disease.  Echo on 06/29/12 with EF of 55-60%.   PCP- Dr. Claudell Kyle Cardiologist- Dr. [place]   Past Medical History  Diagnosis Date  . Diabetes mellitus   . GERD (gastroesophageal reflux disease)   . CAD (coronary artery disease)     a. 03/2002 - CABG x 3: LIMA > LAD, R Rad > OM2, SVG > PDA.    b. Cath 06/2012 - Significant three-vessel disease, 3/3 grafts patent. LVEF 60-65%   . Hypertension   . Hyperlipidemia   . Tobacco abuse     a. ongoing 06/2012  . NSTEMI (non-ST elevated myocardial infarction) September 2013    grafts patent; EF normal; manage medically  . Kidney stones    Past Surgical History  Procedure Laterality Date  . Coronary artery bypass graft  03/16/2002    a) x3: LIMA-LAD, R rad-Cx  branch, SVG-PDA    Family History  Problem Relation Age of Onset  . Insulin resistance Mother 54  . Colon cancer Father 76  . Heart attack Father 71  . Stomach cancer Neg Hx    History  Substance Use Topics  . Smoking status: Current Every Day Smoker -- 0.50 packs/day for 30 years    Types: Cigarettes  . Smokeless tobacco: Never Used  . Alcohol Use: Yes     Comment: rarely.    Review of Systems  Cardiovascular: Positive for chest pain.  Genitourinary: Positive for flank pain.  All other systems reviewed and are negative.    Allergies  Review of patient's allergies indicates no known allergies.  Home Medications   Current Outpatient Rx  Name  Route  Sig  Dispense  Refill  . insulin NPH-regular (NOVOLIN 70/30) (70-30) 100 UNIT/ML injection   Subcutaneous   Inject 40-45 Units into the skin 2 (two) times daily with a meal. Use 45 units every morning and use 40 units every night         . metFORMIN (GLUCOPHAGE) 1000 MG tablet   Oral   Take 1 tablet (1,000 mg total) by mouth 2 (two) times daily with a meal.   60 tablet   5     Resume on 07/01/2012   . nitroGLYCERIN (NITROSTAT) 0.4 MG SL tablet  Sublingual   Place 1 tablet (0.4 mg total) under the tongue every 5 (five) minutes as needed for chest pain.      12    BP 143/63  Pulse 61  Temp(Src) 97.4 F (36.3 C) (Oral)  Resp 20  SpO2 100%  Physical Exam  Nursing note and vitals reviewed. Constitutional: He is oriented to person, place, and time. He appears well-developed and well-nourished. No distress.  Appears uncomfortable  HENT:  Head: Normocephalic and atraumatic.  Mouth/Throat: Oropharynx is clear and moist.  Eyes: Conjunctivae and EOM are normal. Pupils are equal, round, and reactive to light.  Neck: Normal range of motion. Neck supple.  Cardiovascular: Normal rate, regular rhythm, normal heart sounds and normal pulses.   Pulmonary/Chest: Effort normal and breath sounds normal. No respiratory  distress. He has no decreased breath sounds. He has no wheezes.  Abdominal: Soft. Bowel sounds are normal. There is no tenderness. There is CVA tenderness (left). There is no tenderness at McBurney's point and negative Murphy's sign.  Musculoskeletal: Normal range of motion. He exhibits no edema.  Neurological: He is alert and oriented to person, place, and time.  Skin: Skin is warm and dry. He is not diaphoretic.  Psychiatric: He has a normal mood and affect.    ED Course   Procedures (including critical care time)   Date: 05/09/2013  Rate: 65  Rhythm: normal sinus rhythm  QRS Axis: normal  Intervals: normal  ST/T Wave abnormalities: normal  Conduction Disutrbances:none  Narrative Interpretation: NSR, no STEMI  Old EKG Reviewed: unchanged    Labs Reviewed  CBC WITH DIFFERENTIAL - Abnormal; Notable for the following:    WBC 11.9 (*)    Neutro Abs 8.2 (*)    All other components within normal limits  HEPATIC FUNCTION PANEL - Abnormal; Notable for the following:    Albumin 3.4 (*)    Alkaline Phosphatase 128 (*)    All other components within normal limits  URINALYSIS, ROUTINE W REFLEX MICROSCOPIC - Abnormal; Notable for the following:    APPearance CLOUDY (*)    Glucose, UA >1000 (*)    Hgb urine dipstick LARGE (*)    Ketones, ur 15 (*)    Protein, ur 30 (*)    All other components within normal limits  POCT I-STAT, CHEM 8 - Abnormal; Notable for the following:    Glucose, Bld 252 (*)    Calcium, Ion 1.07 (*)    All other components within normal limits  URINE MICROSCOPIC-ADD ON  POCT I-STAT TROPONIN I  POCT I-STAT TROPONIN I   Dg Chest Port 1 View  05/09/2013   *RADIOLOGY REPORT*  Clinical Data: Chest and low back pain.  PORTABLE CHEST - 1 VIEW  Comparison: Plain film chest 06/28/2012.  Findings: Lungs are clear.  Heart size is normal.  No pneumothorax or pleural effusion.  The patient is status post CABG.  IMPRESSION: No acute disease.   Original Report Authenticated  By: Holley Dexter, M.D.   1. Urinary tract obstruction by kidney stone     MDM   EKG NSR, no acute ischemic changes.  Trop negative .  Labs as above-- u/a with large blood.  CT with low grade obstruction of left kidney due to stone.  Will consult urology for recommendations.  9:34 AM Discussed CT results with urology, Dr. Sherron Monday-- advised to achieve pain control and have close FU in office.  Will give additional Toradol.  Discussed this with pt.  States his CP has completely  resolved at this time but given cardiac hx, will obtain 2nd trop.  11:13 AM 2nd trop negative-- doubt ACS, PE, dissection, or other vascular collapse at this time but may FU with Dr. [place] if needed.  Pt re-evaluated, flank pain much improved after Toradol, pt has been passing urine without difficulty.  No recurrent chest pain, VS remained stable, feel that pt is ok for d/c.  Rx flomax, percocet, zofran. Given urine strainer and instructed to strain all urine and monitor for passage of stones.  FU with Dr. Sherron Monday as soon as possible.  Discussed plan with pt, he agreed.  Return precautions advised.   Garlon Hatchet, PA-C 05/09/13 1552

## 2013-05-09 NOTE — ED Notes (Signed)
Pt restless, writhing, 10/10 pain, no change, EDPA in to see pt.

## 2013-05-09 NOTE — ED Notes (Signed)
O2 [place] 4L applied after dilaudid given.

## 2013-05-09 NOTE — ED Notes (Signed)
PT.SEEN HERE AND  DIAGNOSED WITH KIDNEY STONE THIS MORNING , PRESCRIBED WITH OXYCODONE , FLOMAX AND ZOFRAN , REPORTS PERSISTENT LEFT LOWER BACK / LEFT GROIN AND LEFT LOWER  ABDOMEN PAIN WITH NAUSEA.

## 2013-05-09 NOTE — ED Notes (Signed)
Lab at BS 

## 2013-05-09 NOTE — ED Notes (Signed)
Xray at BS 

## 2013-05-09 NOTE — ED Provider Notes (Signed)
  Medical screening examination/treatment/procedure(s) were performed by non-physician practitioner and as supervising physician I was immediately available for consultation/collaboration.    Ella Guillotte, MD 05/09/13 1603 

## 2013-05-10 ENCOUNTER — Emergency Department (HOSPITAL_COMMUNITY)
Admission: EM | Admit: 2013-05-10 | Discharge: 2013-05-10 | Disposition: A | Discharge status: Home / Self Care | Payer: No Typology Code available for payment source | Attending: Emergency Medicine | Admitting: Emergency Medicine

## 2013-05-10 DIAGNOSIS — N200 Calculus of kidney: Secondary | ICD-10-CM

## 2013-05-10 HISTORY — DX: Calculus of kidney: N20.0

## 2013-05-10 LAB — POCT I-STAT, CHEM 8
BUN: 19 mg/dL (ref 6–23)
Calcium, Ion: 1.18 mmol/L (ref 1.12–1.23)
Creatinine, Ser: 1.3 mg/dL (ref 0.50–1.35)
Glucose, Bld: 310 mg/dL — ABNORMAL HIGH (ref 70–99)
Sodium: 135 mEq/L (ref 135–145)
TCO2: 23 mmol/L (ref 0–100)

## 2013-05-10 MED ORDER — METOCLOPRAMIDE HCL 10 MG PO TABS: 10.0000 mg | ORAL_TABLET | Freq: Four times a day (QID) | ORAL | Status: DC

## 2013-05-10 MED ORDER — METOCLOPRAMIDE HCL 5 MG/ML IJ SOLN
10.0000 mg | Freq: Once | INTRAMUSCULAR | Status: AC
  Administered 2013-05-10: 10 mg via INTRAMUSCULAR
  Filled 2013-05-10: qty 2

## 2013-05-10 MED ORDER — OXYCODONE-ACETAMINOPHEN 5-325 MG PO TABS
2.0000 | ORAL_TABLET | Freq: Once | ORAL | Status: AC
  Administered 2013-05-10: 2 via ORAL
  Filled 2013-05-10: qty 2

## 2013-05-10 MED ORDER — OXYCODONE-ACETAMINOPHEN 5-325 MG PO TABS: 1.0000 | ORAL_TABLET | ORAL | Status: DC | PRN

## 2013-05-10 MED ORDER — PROMETHAZINE HCL 25 MG PO TABS
25.0000 mg | ORAL_TABLET | Freq: Once | ORAL | Status: AC
  Administered 2013-05-10: 25 mg via ORAL
  Filled 2013-05-10: qty 1

## 2013-05-10 NOTE — ED Provider Notes (Signed)
CSN: [id number]     Arrival date & time 05/09/13  2003 History     None    Chief Complaint  Patient presents with  . Nephrolithiasis   (Consider location/radiation/quality/duration/timing/severity/associated sxs/prior Treatment) HPI History provided by pt and prior chart.  Per prior chart, pt presented to ED yesterday am w/ L flank pain that radiated to the groin.  CT abd/pelvis w/out contrast sig for L ureteral stone w/ mild obstruction.  Cr w/in nml range and U/A neg for infection.  D/c'd home w/ zofran, flomax and percocet. Returns to ER w/ increased severity of and intractable nausea; has not vomited since leaving ED.  Reports no relief w/ percocet.  No associated symptoms.   Past Medical History  Diagnosis Date  . Diabetes mellitus   . GERD (gastroesophageal reflux disease)   . CAD (coronary artery disease)     a. 03/2002 - CABG x 3: LIMA > LAD, R Rad > OM2, SVG > PDA.    b. Cath 06/2012 - Significant three-vessel disease, 3/3 grafts patent. LVEF 60-65%   . Hypertension   . Hyperlipidemia   . Tobacco abuse     a. ongoing 06/2012  . NSTEMI (non-ST elevated myocardial infarction) September 2013    grafts patent; EF normal; manage medically  . Kidney stones   . Kidney stone    Past Surgical History  Procedure Laterality Date  . Coronary artery bypass graft  03/16/2002    a) x3: LIMA-LAD, R rad-Cx branch, SVG-PDA    Family History  Problem Relation Age of Onset  . Insulin resistance Mother 63  . Colon cancer Father 70  . Heart attack Father 67  . Stomach cancer Neg Hx    History  Substance Use Topics  . Smoking status: Current Every Day Smoker -- 0.50 packs/day for 30 years    Types: Cigarettes  . Smokeless tobacco: Never Used  . Alcohol Use: Yes     Comment: rarely.    Review of Systems  All other systems reviewed and are negative.    Allergies  Review of patient's allergies indicates no known allergies.  Home Medications   Current Outpatient Rx  Name  Route   Sig  Dispense  Refill  . insulin NPH-regular (NOVOLIN 70/30) (70-30) 100 UNIT/ML injection   Subcutaneous   Inject 40-45 Units into the skin 2 (two) times daily with a meal. Use 45 units every morning and use 40 units every night         . metFORMIN (GLUCOPHAGE) 1000 MG tablet   Oral   Take 1,000 mg by mouth 2 (two) times daily with a meal.         . nitroGLYCERIN (NITROSTAT) 0.4 MG SL tablet   Sublingual   Place 0.4 mg under the tongue every 5 (five) minutes as needed for chest pain.         Marland Kitchen ondansetron (ZOFRAN-ODT) 4 MG disintegrating tablet   Oral   Take 4 mg by mouth every 8 (eight) hours as needed for nausea.         Marland Kitchen oxyCODONE-acetaminophen (PERCOCET/ROXICET) 5-325 MG per tablet   Oral   Take 1 tablet by mouth every 4 (four) hours as needed for pain.         . tamsulosin (FLOMAX) 0.4 MG CAPS capsule   Oral   Take 0.4 mg by mouth daily.          BP 164/79  Pulse 83  Resp 18  SpO2 100% Physical  Exam  Nursing note and vitals reviewed. Constitutional: He is oriented to person, place, and time. He appears well-developed and well-nourished. No distress.  HENT:  Head: Normocephalic and atraumatic.  Eyes:  Normal appearance  Neck: Normal range of motion.  Cardiovascular: Normal rate and regular rhythm.   Pulmonary/Chest: Effort normal and breath sounds normal. No respiratory distress.  Abdominal: Soft. Bowel sounds are normal. He exhibits no distension and no mass. There is no rebound and no guarding.  Diffuse left-sided abdominal ttp.   Genitourinary:  L CVA tenderness  Musculoskeletal: Normal range of motion.  Neurological: He is alert and oriented to person, place, and time.  Skin: Skin is warm and dry. No rash noted.  Psychiatric: He has a normal mood and affect. His behavior is normal.    ED Course   Procedures (including critical care time)  Labs Reviewed - No data to display Ct Abdomen Pelvis Wo Contrast  05/09/2013   *RADIOLOGY REPORT*   Clinical Data: Left flank and left groin pain, hematuria, some shortness of breath, history of prior kidney stones  CT ABDOMEN AND PELVIS WITHOUT CONTRAST  Technique:  Multidetector CT imaging of the abdomen and pelvis was performed following the standard protocol without intravenous contrast.  Comparison: None.  Findings: The lung bases are clear.  The liver is unremarkable in the unenhanced state.  No calcified gallstones are seen although there may be sludge layering in the gallbladder.  The pancreas is normal in size and the pancreatic duct is not dilated.  The adrenal glands and spleen are unremarkable.  The stomach is decompressed.  No definite renal calculi are seen.  The right pelvocaliceal system is unremarkable.  However, there is slight fullness of the left pelvocaliceal system to a point of low grade obstruction by a proximal left ureteral calculus of 6 x 5 x 8 mm.  The abdominal aorta is normal in caliber with mild atheromatous change present for age.  The distal ureters are normal in caliber and no distal ureteral calculus is seen.  The urinary bladder is not well distended.  The prostate is within normal limits in size.  No fluid is seen within the pelvis.  No abnormality of the colon is seen.  The terminal ileum and the appendix are unremarkable.  Fat enters both inguinal canals.  On coronal bone window images, there is irregularity of the right femoral head which could indicate a small focus of avascular necrosis.  A benign-appearing sclerotic bone island is noted in the right iliac wing near the acetabulum.  The lumbar vertebrae are in normal alignment with degenerative change throughout the facet joints of the lower lumbar spine.  Intervertebral disc spaces are relatively well preserved with mild vacuum disc phenomenon at L5- S1.  IMPRESSION:  1.  Low grade obstruction of the left kidney by a proximal left ureteral calculus of 6 x 5 x 8 mm. 2.  No other renal calculi are seen. 3.  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 4.  Degenerative change throughout the facet joints of the lower lumbar spine.   Original Report Authenticated By: Dwyane Dee, M.D.   Dg Chest Port 1 View  05/09/2013   *RADIOLOGY REPORT*  Clinical Data: Chest and low back pain.  PORTABLE CHEST - 1 VIEW  Comparison: Plain film chest 06/28/2012.  Findings: Lungs are clear.  Heart size is normal.  No pneumothorax or pleural effusion.  The patient is status post CABG.  IMPRESSION:  No acute disease.   Original Report Authenticated By: Holley Dexter, M.D.   1. Nephrolithiasis     MDM  [phone] M, diagnosed w/ mildly obstructive L ureteral stone by CT in ED yesterday am, returns w/ worsened pain and intractable nausea.  No relief w/ percocet/zofran.   On exam, afebrile, NAD, abd soft/non-distended, diffuse L-sided abd and L CVA ttp.  Repeat Cr ordered and pt to receive po phenergan and 2 percocet.  If sx improve, will d/c home to f/u with urology outpatient.  1:27 AM   Pain resolved.  No relief w/ phenergan but tolerating POs shortly after receiving reglan.  Will d/c home w/ same + 12 more percocet so he can take 2 q6hrs prn.  Advised f/u with urology.  Return precautions discussed.   Otilio Miu, PA-C 05/10/13 [phone]

## 2013-05-10 NOTE — ED Provider Notes (Signed)
Medical screening examination/treatment/procedure(s) were performed by non-physician practitioner and as supervising physician I was immediately available for consultation/collaboration.  Hurman Horn, MD 05/10/13 2052

## 2013-05-11 ENCOUNTER — Observation Stay (HOSPITAL_COMMUNITY)
Admission: AD | Admit: 2013-05-11 | Discharge: 2013-05-13 | Disposition: A | Discharge status: Home / Self Care | Payer: No Typology Code available for payment source | Source: Ambulatory Visit | Attending: Internal Medicine | Admitting: Internal Medicine

## 2013-05-11 ENCOUNTER — Ambulatory Visit: Payer: No Typology Code available for payment source | Admitting: Internal Medicine

## 2013-05-11 ENCOUNTER — Encounter (HOSPITAL_COMMUNITY): Payer: Self-pay

## 2013-05-11 ENCOUNTER — Telehealth: Payer: Self-pay | Admitting: *Deleted

## 2013-05-11 VITALS — BP 131/77 | HR 85 | Temp 97.3°F | Ht 70.0 in | Wt 224.0 lb

## 2013-05-11 DIAGNOSIS — I252 Old myocardial infarction: Secondary | ICD-10-CM | POA: Insufficient documentation

## 2013-05-11 DIAGNOSIS — N179 Acute kidney failure, unspecified: Secondary | ICD-10-CM

## 2013-05-11 DIAGNOSIS — K219 Gastro-esophageal reflux disease without esophagitis: Secondary | ICD-10-CM | POA: Insufficient documentation

## 2013-05-11 DIAGNOSIS — I10 Essential (primary) hypertension: Secondary | ICD-10-CM

## 2013-05-11 DIAGNOSIS — N200 Calculus of kidney: Secondary | ICD-10-CM

## 2013-05-11 DIAGNOSIS — F17200 Nicotine dependence, unspecified, uncomplicated: Secondary | ICD-10-CM

## 2013-05-11 DIAGNOSIS — N201 Calculus of ureter: Principal | ICD-10-CM | POA: Insufficient documentation

## 2013-05-11 DIAGNOSIS — E119 Type 2 diabetes mellitus without complications: Secondary | ICD-10-CM

## 2013-05-11 DIAGNOSIS — E785 Hyperlipidemia, unspecified: Secondary | ICD-10-CM

## 2013-05-11 DIAGNOSIS — I2510 Atherosclerotic heart disease of native coronary artery without angina pectoris: Secondary | ICD-10-CM | POA: Insufficient documentation

## 2013-05-11 DIAGNOSIS — R42 Dizziness and giddiness: Secondary | ICD-10-CM

## 2013-05-11 DIAGNOSIS — Z951 Presence of aortocoronary bypass graft: Secondary | ICD-10-CM | POA: Insufficient documentation

## 2013-05-11 HISTORY — DX: Calculus of kidney: N20.0

## 2013-05-11 LAB — URINALYSIS, ROUTINE W REFLEX MICROSCOPIC
Glucose, UA: 100 mg/dL — AB
Hgb urine dipstick: NEGATIVE
Leukocytes, UA: NEGATIVE
pH: 5.5 (ref 5.0–8.0)

## 2013-05-11 LAB — GLUCOSE, CAPILLARY
Glucose-Capillary: 138 mg/dL — ABNORMAL HIGH (ref 70–99)
Glucose-Capillary: 156 mg/dL — ABNORMAL HIGH (ref 70–99)

## 2013-05-11 LAB — BASIC METABOLIC PANEL WITH GFR
CO2: 27 mEq/L (ref 19–32)
Chloride: 100 mEq/L (ref 96–112)
Creat: 1.42 mg/dL — ABNORMAL HIGH (ref 0.50–1.35)
Glucose, Bld: 113 mg/dL — ABNORMAL HIGH (ref 70–99)

## 2013-05-11 MED ORDER — MORPHINE SULFATE 2 MG/ML IJ SOLN
2.0000 mg | INTRAMUSCULAR | Status: DC | PRN
  Administered 2013-05-11 – 2013-05-12 (×4): 2 mg via INTRAVENOUS
  Filled 2013-05-11 (×4): qty 1

## 2013-05-11 MED ORDER — SODIUM CHLORIDE 0.9 % IJ SOLN
3.0000 mL | Freq: Two times a day (BID) | INTRAMUSCULAR | Status: DC
  Administered 2013-05-11: 3 mL via INTRAVENOUS

## 2013-05-11 MED ORDER — TAMSULOSIN HCL 0.4 MG PO CAPS
0.4000 mg | ORAL_CAPSULE | Freq: Every day | ORAL | Status: DC
  Administered 2013-05-12 – 2013-05-13 (×2): 0.4 mg via ORAL
  Filled 2013-05-11 (×2): qty 1

## 2013-05-11 MED ORDER — HYDROMORPHONE HCL PF 1 MG/ML IJ SOLN
1.0000 mg | Freq: Once | INTRAMUSCULAR | Status: AC
  Administered 2013-05-11: 1 mg via INTRAVENOUS
  Filled 2013-05-11: qty 1

## 2013-05-11 MED ORDER — SODIUM CHLORIDE 0.9 % IV SOLN: INTRAVENOUS | Status: AC

## 2013-05-11 MED ORDER — INSULIN ASPART 100 UNIT/ML ~~LOC~~ SOLN
0.0000 [IU] | SUBCUTANEOUS | Status: DC
  Administered 2013-05-11 – 2013-05-12 (×2): 2 [IU] via SUBCUTANEOUS
  Administered 2013-05-12 (×2): 1 [IU] via SUBCUTANEOUS
  Administered 2013-05-13: 2 [IU] via SUBCUTANEOUS
  Administered 2013-05-13: 3 [IU] via SUBCUTANEOUS
  Administered 2013-05-13: 1 [IU] via SUBCUTANEOUS

## 2013-05-11 MED ORDER — SODIUM CHLORIDE 0.9 % IV SOLN
INTRAVENOUS | Status: AC
  Administered 2013-05-11: 20:00:00 via INTRAVENOUS

## 2013-05-11 MED ORDER — HEPARIN SODIUM (PORCINE) 5000 UNIT/ML IJ SOLN
5000.0000 [IU] | Freq: Three times a day (TID) | INTRAMUSCULAR | Status: DC
  Administered 2013-05-11 – 2013-05-12 (×2): 5000 [IU] via SUBCUTANEOUS
  Filled 2013-05-11 (×4): qty 1

## 2013-05-11 MED ORDER — PANTOPRAZOLE SODIUM 40 MG PO TBEC
40.0000 mg | DELAYED_RELEASE_TABLET | Freq: Every day | ORAL | Status: DC
  Administered 2013-05-11 – 2013-05-13 (×3): 40 mg via ORAL
  Filled 2013-05-11 (×4): qty 1

## 2013-05-11 NOTE — Progress Notes (Signed)
Subjective:    Patient ID: Benjamin Baker, male    DOB: 28-Sep-1956, 57 y.o.   MRN: [id number]  CC: flank pain  Diabetes Pertinent negatives for hypoglycemia include no dizziness. Pertinent negatives for diabetes include no chest pain and no weakness.    Benjamin Baker is a 57yo man with PMH of CAD s/p 3V CABG, DM type 2, HTN who presents with 2 day history of flank pain radiating into the groin.  He presented to the ED on 8/6 with this complaint and was diagnosed with a kidney stone by CT scan.  He presented again to the ED on 05/10/13 for worsening pain and was sent home with medication for nausea and pain control and flomax.  He reports that yesterday evening he passed a small black speck (like a grain of sand) and had some improvement in his pain.  He attempted to go to work this am, however, and the pain returned.  He describes the pain as sharp and shooting into his groin.  10/10 at its worst.  He has taken 11 percocet with some relief but not resolution of pain (given 20 in the ED).  He has nausea and has not been able to keep down solid food since Tuesday.  He is able to keep down fluids, mostly water.  Today he reports increased issues urinating.  He has only peed once today and that concerns him.  He feels somewhat bloated.  He was able to urinate in clinic to give a urine sample. He denies seeing any blood in his urine.  He is using the urine filter as provided by the ED.   He has a history of a kidney stone > 20 years ago, but his symptoms this time are much worse. He has been taking his medication for diabetes as prescribed, insulin and metformin.     Review of Systems  Constitutional: Positive for fever (subjective) and appetite change (cannot keep anything down). Negative for chills.  HENT: Negative for hearing loss and ear pain.   Eyes: Negative for pain and visual disturbance.  Respiratory: Negative for chest tightness and shortness of breath.   Cardiovascular: Negative for chest pain  and leg swelling.  Gastrointestinal: Positive for nausea and vomiting (every time he tries to eat). Negative for diarrhea, constipation and blood in stool.  Genitourinary: Positive for flank pain, decreased urine volume and difficulty urinating. Negative for dysuria, frequency, hematuria, penile pain and testicular pain.  Musculoskeletal: Positive for back pain. Negative for joint swelling and gait problem.  Skin: Negative for rash and wound.  Neurological: Negative for dizziness and weakness.       Objective:   Physical Exam  Constitutional: He is oriented to person, place, and time. He appears well-developed and well-nourished.  Appears uncomfortable sitting in room but not agitated  HENT:  Head: Normocephalic and atraumatic.  Eyes: Conjunctivae are normal. No scleral icterus.  Cardiovascular: Normal rate, regular rhythm and normal heart sounds.   No murmur heard. Pulmonary/Chest: Effort normal and breath sounds normal. No respiratory distress.  Abdominal: Soft. Bowel sounds are normal. There is no tenderness. Hernia confirmed negative in the right inguinal area and confirmed negative in the left inguinal area.  Genitourinary:  + L CVA tenderness  Lymphadenopathy:       Right: No inguinal adenopathy present.       Left: No inguinal adenopathy present.  Neurological: He is alert and oriented to person, place, and time.  Skin: Skin is warm and dry. No  erythema.  Psychiatric: His behavior is normal. Thought content normal.    BMP reveals worsening Cr, concerning for obstructive acute kidney injury.       Assessment & Plan:  Admit for observation and AKI.

## 2013-05-11 NOTE — Assessment & Plan Note (Signed)
Now with worsening renal function, only partially controlled pain and negligible PO intake.  Consultation to outpatient Urology is unlikely given insurance status and time of day.  Will plan for Observation admission and hopeful Urology consult in the hospital.  Possibly could try other medications, such as a CCB under observation along with fluids to assist in improving renal function and possible passage of stone.

## 2013-05-11 NOTE — Telephone Encounter (Signed)
Pt walked in to clinic unable to go to work do to pain from kidney stone. [employer] - unable to make appt with [facility]. Pt states pain is on left side of back and pain med not helping. Problems with eating - due to nausea. Appt made this AM with med student. Stanton Kidney Buck Mcaffee RN 05/11/13 10AM

## 2013-05-11 NOTE — H&P (Signed)
Date: 05/11/2013               Patient Name:  Benjamin Baker MRN: [id number]  DOB: 1955-11-15 Age / Sex: 57 y.o., male   PCP: Vivi Barrack, MD         Medical Service: Internal Medicine Teaching Service         Attending Physician: Dr. Jonah Blue, DO    First Contact: Dr. Johna Roles Pager: [phone]  Second Contact: Dr. Dierdre Searles Pager: [phone]       After Hours (After 5p/  First Contact Pager: [phone]  weekends / holidays): Second Contact Pager: [phone]   Chief Complaint: "pain on my left side and groin"  History of Present Illness:  Pt is a 57 yo M with pmh of HTN, HL, CAD s/p 3 vessel CABG 6/13 with EF 55-60%, on 9/13, T2DM, GERD, NSTEMI 06/2012 with nephrolithiasis 25 years ago and recent ureteral calculi who presents with worsening left sided flank pain with radiation to groin for the past two days. Pt reports he awoke Tuesday night at approximately 3AM with constant and stabbing 10/10 left flank pain with radiation to groin. He also reports subjective fever with rigors, dyspnea, CP, nausea, NBNB emesis, urinary frequency, and decreased urine output. No gross hematuria.  He was seen at [facility] ED that night for worsening pain with associated CP and dyspnea that he described was similar to his last MI. Cardiac evaluation for ACS was unremarkable.  He was diagnosed with urinary tract obstruction by kidney stone due to CT abdomen findings of evidence of low grade obstruction of the left kidney by a proximal left ureteral calculus of 6 x 5 x 8 mm and UA findings of microscopic hematuria.  He was discharged home with percocet, zofran, flomax, and instructed on usage of urine strainer. Pt reports eating [employer] chicken sandwich that day and experiencing nausea and vomiting with  worsening pain unrelieved by medications. He subsequently returned to the ED where it was found his renal function had worsened.  He was discharged home on pain medication (incrased in dosage) and advised to follow-up with urology  but unable to so due to insurance issues. His pain has been worsening since then and today complains of constant 10/10 stabbing pain in his left flank region with radiation to his left groin along with urinary frequency and decreased urine output. He reports he has been unable to eat or drink much due to pain and nausea. Pt reports that 25 years ago he had a kidney stone which he does believe required surgical intervention.            Meds: Current Facility-Administered Medications  Medication Dose Route Frequency Provider Last Rate Last Dose  . 0.9 %  sodium chloride infusion   Intravenous Continuous Na Li, MD      . 0.9 %  sodium chloride infusion   Intravenous Continuous Na Li, MD 50 mL/hr at 05/11/13 2011    . heparin injection 5,000 Units  5,000 Units Subcutaneous Q8H Dede Query, MD   5,000 Units at 05/11/13 2136  . insulin aspart (novoLOG) injection 0-9 Units  0-9 Units Subcutaneous Q4H Vivi Barrack, MD   2 Units at 05/11/13 2222  . morphine 2 MG/ML injection 2 mg  2 mg Intravenous Q4H PRN Na Li, MD   2 mg at 05/11/13 2225  . [START ON 05/12/2013] pantoprazole (PROTONIX) EC tablet 40 mg  40 mg Oral Q breakfast Na Li, MD   40 mg at 05/11/13  2017  . sodium chloride 0.9 % injection 3 mL  3 mL Intravenous Q12H Na Li, MD   3 mL at 05/11/13 2136  . [START ON 05/12/2013] tamsulosin (FLOMAX) capsule 0.4 mg  0.4 mg Oral Daily Dede Query, MD      Current Outpatient Rx   Route  Sig  Dispense  Refill  . insulin NPH-regular (NOVOLIN 70/30) (70-30) 100 UNIT/ML injection  Subcutaneous  Inject 40-45 Units into the skin 2 (two) times daily with a meal. Use 45 units every morning and use 40 units every night  . metFORMIN (GLUCOPHAGE) 1000 MG tablet  Oral  Take 1,000 mg by mouth 2 (two) times daily with a meal.  . nitroGLYCERIN (NITROSTAT) 0.4 MG SL tablet  Sublingual  Place 0.4 mg under the tongue every 5 (five) minutes as needed for chest pain.  Marland Kitchen ondansetron (ZOFRAN-ODT) 4 MG disintegrating tablet  Oral  Take  4 mg by mouth every 8 (eight) hours as needed for nausea.  Marland Kitchen oxyCODONE-acetaminophen (PERCOCET/ROXICET) 5-325 MG per tablet  Oral  Take 1 tablet by mouth every 4 (four) hours as needed for pain.  . tamsulosin (FLOMAX) 0.4 MG CAPS capsule   Allergies: Allergies as of 05/11/2013  . (No Known Allergies)   Past Medical History  Diagnosis Date  . Diabetes mellitus   . GERD (gastroesophageal reflux disease)   . CAD (coronary artery disease)     a. 03/2002 - CABG x 3: LIMA > LAD, R Rad > OM2, SVG > PDA.    b. Cath 06/2012 - Significant three-vessel disease, 3/3 grafts patent. LVEF 60-65%   . Hypertension   . Hyperlipidemia   . Tobacco abuse     a. ongoing 06/2012  . NSTEMI (non-ST elevated myocardial infarction) September 2013    grafts patent; EF normal; manage medically  . Kidney stones   . Kidney stone   . Kidney stone on left side 05/11/2013   Past Surgical History  Procedure Laterality Date  . Coronary artery bypass graft  03/16/2002    a) x3: LIMA-LAD, R rad-Cx branch, SVG-PDA    Family History  Problem Relation Age of Onset  . Insulin resistance Mother 56  . Colon cancer Father 92  . Heart attack Father 51  . Stomach cancer Neg Hx    History   Social History  . Marital Status: Divorced    Spouse Name: N/A    Number of Children: N/A  . Years of Education: 12   Occupational History  . Not on file.   Social History Main Topics  . Smoking status: Current Every Day Smoker -- 0.50 packs/day for 30 years    Types: Cigarettes  . Smokeless tobacco: Never Used  . Alcohol Use: Yes     Comment: rarely.  . Drug Use: No  . Sexually Active: Not on file   Other Topics Concern  . Not on file   Social History Narrative  . No narrative on file    Review of Systems:    Review of Systems:  Constitutional:  Denies + fever, + chills,  diaphoresis, +appetite change and fatigue.   HEENT:  Denies congestion, sore throat, +rhinorrhea, sneezing, mouth sores, trouble swallowing,  neck pain   Respiratory:  Denies +SOB, DOE, cough, and wheezing.   Cardiovascular:  Denies +CP, palpitations and leg swelling.   Gastrointestinal:  Denies +nausea, +vomiting, abdominal pain, diarrhea, + constipation, blood in stool and abdominal distention.   Genitourinary:  Denies dysuria, urgency, +frequency, hematuria, +flank  pain and difficulty urinating.   Musculoskeletal:  Denies myalgias, +back pain, joint swelling, arthralgias and gait problem.   Skin:  Denies pallor  Neurological:  Denies dizziness, seizures, syncope, weakness, light-headedness, +numbness and headaches.       Physical Exam: Blood pressure 170/87, pulse 87, temperature 97.2 F (36.2 C), temperature source Oral, resp. rate 18, height 5\' 9"  (1.753 m), weight 97.7 kg (215 lb 6.2 oz), SpO2 99.00%.  General: alert, in distress , due to pain    Head: normocephalic and atraumatic.  Eyes: vision grossly intact, pupils equal, pupils round, pupils reactive to light, no injection and anicteric.  Mouth: pharynx pink and moist, no erythema, and no exudates.  Neck: supple, full ROM  Lungs: normal respiratory effort, no accessory muscle use, normal breath sounds, no crackles, and no wheezes. Heart: normal rate, regular rhythm, no murmur, no gallop, and no rub.  Abdomen: soft, TTP LLQ, normal bowel sounds,  voluntary guarding, no rebound tenderness, left sided CVA tenderness Msk: no joint swelling, no joint warmth, and no redness over joints.  Pulses: 2+ DP/PT pulses bilaterally Extremities: No cyanosis, clubbing, edema Neurologic: alert & oriented X3, cranial nerves II-XII intact, strength normal in all extremities, sensation intact to light touch   Skin: turgor normal and no rashes.  Psych: Oriented X3, memory intact for recent and remote, normally interactive, good eye contact, not anxious appearing, and not depressed appearin  Lab results: Basic Metabolic Panel:  Recent Labs  29/56/21 0157 05/11/13 1141  NA 135 136    K 3.8 3.6  CL 101 100  CO2  --  27  GLUCOSE 310* 113*  BUN 19 17  CREATININE 1.30 1.42*  CALCIUM  --  9.6   Liver Function Tests:  Recent Labs  05/09/13 0505  AST 17  ALT 15  ALKPHOS 128*  BILITOT 0.5  PROT 6.5  ALBUMIN 3.4*   No results found for this basename: LIPASE, AMYLASE,  in the last 72 hours No results found for this basename: AMMONIA,  in the last 72 hours CBC:  Recent Labs  05/09/13 0505 05/09/13 0510 05/10/13 0157  WBC 11.9*  --   --   NEUTROABS 8.2*  --   --   HGB 13.9 14.3 14.6  HCT 40.0 42.0 43.0  MCV 88.9  --   --   PLT 261  --   --    Cardiac Enzymes: No results found for this basename: CKTOTAL, CKMB, CKMBINDEX, TROPONINI,  in the last 72 hours BNP: No results found for this basename: PROBNP,  in the last 72 hours D-Dimer: No results found for this basename: DDIMER,  in the last 72 hours CBG:  Recent Labs  05/11/13 1015  GLUCAP 156*   Hemoglobin A1C:  Recent Labs  05/11/13 1025  HGBA1C 8.6   Fasting Lipid Panel: No results found for this basename: CHOL, HDL, LDLCALC, TRIG, CHOLHDL, LDLDIRECT,  in the last 72 hours Thyroid Function Tests: No results found for this basename: TSH, T4TOTAL, FREET4, T3FREE, THYROIDAB,  in the last 72 hours Anemia Panel: No results found for this basename: VITAMINB12, FOLATE, FERRITIN, TIBC, IRON, RETICCTPCT,  in the last 72 hours Coagulation: No results found for this basename: LABPROT, INR,  in the last 72 hours Urine Drug Screen: Drugs of Abuse  No results found for this basename: labopia, cocainscrnur, labbenz, amphetmu, thcu, labbarb    Alcohol Level: No results found for this basename: ETH,  in the last 72 hours Urinalysis:  Recent Labs  05/09/13 0617 05/11/13 1136  COLORURINE YELLOW YELLOW  LABSPEC 1.030 1.014  PHURINE 7.5 5.5  GLUCOSEU >1000* 100*  HGBUR LARGE* NEG  BILIRUBINUR NEGATIVE NEG  KETONESUR 15* NEG  PROTEINUR 30* NEG  UROBILINOGEN 1.0 0.2  NITRITE NEGATIVE NEG   LEUKOCYTESUR NEGATIVE NEG   Misc. Labs:   Imaging results:  No results found.  Other results: EKG: in progress  Assessment & Plan by Problem: Active Problems:   DIABETES MELLITUS, TYPE II   DYSLIPIDEMIA   TOBACCO ABUSE   HYPERTENSION   GERD   CORONARY ARTERY BYPASS GRAFT, THREE VESSEL, HX OF   Kidney stone on left side  Assessment: 57 yo M with pmh of HTN, HL, CAD s/p 3 vessel CABG 6/13 with EF 55-60%, on 9/13, T2DM, GERD, NSTEMI 06/2012 with nephrolithiasis 25 years ago  who presents with worsening left sided flank pain with radiation to groin, rigors, nausea, and emeis for two days and found to have mild obstruction of left kidney by left ureteral calculus with worsening renal function.        Symptomatic Nephrolithiasis -  CT abdomen findings (8/6) with low grade obstruction of the left kidney by a proximal left ureteral calculus of 6 x 5 x 8 mm and UA findings (8/6) of hematuria. Also with leukocytosis (11.9) with left shift on previous ED admission on 8/6. Pt with symptoms of flank pain, rigors, nausea, vomiting, urinary symptoms and left-sided CVA tenderness. Conservative management with fluids and pain control with close monitoring.        -Obtain CBC w/ diff  -Obtain BMP, Mg  -Consider PTH, uric acid levels  -Obtain UA w/ culture  -Start NS IV fluids  -Pain control with morphine  -Continue flomax, alpha blocker and consider CCB for encouraging pasage -Urology consult in AM to decide if surgical intervention needed or conservatieve   AKI - uptrending Cr (0.9 to 1.30 to 1.42) most likely postrenal due to obstructive uropathy, pre-renal component as well due to hyovolemia from decreased PO intake and GI loss (emesis)  -Obtain BMP -Obtain KUB to assess for hydronephrosis -Hold nephrotoxic agents (NSAIDS, contrast, ACEi, ARB) -Consider measuring urine lytes   Hypertension: currently hypertensive most likely 2/2 to pain -not currently on any home hypertensive  medications -hydralazine as needed  CAD s/p 3-vessel coronary bypass and NSTEMI -  6/13; EF 55-60% on 9/13 - CP in ED on 8/6 with negative troponins and unremarkable EKG -Obtain 12-lead EKG -Not currently at home on aspirin, beta blocker,  or statin -Lipid panel 06/2012 with elevated total cholesterol (217), triglycerides (399) and LDL (105), low HDL (32)  GERD - controlled -PO Protonix   Diabetes Mellitus Type II - uncontrolled, HbA1c (8/8) of 8.6, on home metformin 1000mg  BID and NPH 70/30 (45U in AM 40U PM), compliant with medications. No reported complications.  -Obtain CBG checks  - ISS  Tobacco dependence - current smoker -nicotine patch    Diet: clear liquids PPx: SQ Heparin   Dispo: Disposition is deferred at this time, awaiting improvement of current medical problems. Anticipated discharge in approximately day(s).   The patient does have a current PCP (Vivi Barrack, MD) and does need an [facility] hospital follow-up appointment after discharge.  The patient does have transportation limitations that hinder transportation to clinic appointments.  Signed: Otis Brace, MD 05/11/2013, 6:31 PM

## 2013-05-11 NOTE — Progress Notes (Signed)
This is a Psychologist, occupational Note.  The care of the patient was discussed with Dr. Criselda Peaches and the assessment and plan was formulated with their assistance.  Please see their note for official documentation of the patient encounter.   Subjective:   Patient ID: Benjamin Baker male   DOB: 04-Jun-1956 57 y.o.   MRN: [id number]  CC: Flank left back pain.  HPI: Mr.Benjamin Baker is a 57 y.o.  W/ PMH significant for DM-II, CAD s/p 3-vessel CABG, NSTEMI, HTN and 2 day history of nephrolithiasis. The patient reported to the Emergency Department at [facility] via ambulance on 05/09/13 due to chest pain and SOB with pain 10/10. His cardiac evaluation was unremarkable, however he was diagnosed with left sided low grade ureter obstruction based on symptoms and CT. At that time his creatinine was 0.9. He was discharged on Zofran, Percocet 5/325 and Flomax with instructions to follow up at [facility] [phone]). He was also provided a straining device to monitor for passing of his stone.   Mr. Sopp reports that on the way home from the hospital (to [facility] [place]), he attempted to eat fast food from [facility], but experienced immediate N/V. The Zofran did not improve his nausea and his pain continued that evening 10/10. He returned to the hospital late that evening as he could not find relief from his analgesics and continued to experience N/V. His exam was unchanged, however his creatinine has bumped to 1.3. His Percocet was increased to 10mg . He was administered phenergan and reglan that improved his n/v. He was discharged home early Thursday morning on Percocet 10mg  and reglan. He was also advised again to follow up with [facility].  He reports passing a small dark colored object in his urine that was about the size of a piece of sand around 10am on Thursday morning. He was immediately relived of his pain as well as his N/V. However the pain returned that evening around 7pm and he  again experienced n/v when he attempted to eat a peanut butter sandwich that evening. He reports taking 3 tabs of percocet without relief of the pain. He attempted to visit [facility] on Friday 05/11/13, but was informed that they could not see him without a referral. He then reported to [facility] IM outpatient clinic for evaluation.   Today he reports to be in pain 8/10 that starts in his left lower back and radiates around his left side to midline and extends inferior to his left groin area. The pain is constant and is described as a "knife stabbing" pain. It is relieved by hot baths and worsened when laying down flat. He reports a history of kidney stones in his past (>20 years ago), however his current pain is much worse. He has been unable to urinate without difficulty until today at his appointment. He does report to continue to feel urinary obstruction and a sense of being bloated. He has been able to maintain adequate fluids PO, however he has not been able to keep food down since Tuesday evening. He further reports that his urine has been dark tinged and he has not had a BM since Tuesday evening.      Past Medical History  Diagnosis Date   Diabetes mellitus    GERD (gastroesophageal reflux disease)    CAD (coronary artery disease)     a. 03/2002 - CABG x 3: LIMA > LAD, R Rad > OM2, SVG > PDA.  b. Cath 06/2012 - Significant three-vessel disease, 3/3 grafts patent. LVEF 60-65%    Hypertension    Hyperlipidemia    Tobacco abuse     a. ongoing 06/2012   NSTEMI (non-ST elevated myocardial infarction) September 2013    grafts patent; EF normal; manage medically   Kidney stones    Kidney stone    Kidney stone on left side 05/11/2013   Current Outpatient Prescriptions  Medication Sig Dispense Refill   insulin NPH-regular (NOVOLIN 70/30) (70-30) 100 UNIT/ML injection Inject 40-45 Units into the skin 2 (two) times daily with a meal. Use 45 units every morning and use 40 units  every night       metFORMIN (GLUCOPHAGE) 1000 MG tablet Take 1,000 mg by mouth 2 (two) times daily with a meal.       metoCLOPramide (REGLAN) 10 MG tablet Take 1 tablet (10 mg total) by mouth every 6 (six) hours.  20 tablet  0   nitroGLYCERIN (NITROSTAT) 0.4 MG SL tablet Place 0.4 mg under the tongue every 5 (five) minutes as needed for chest pain.       ondansetron (ZOFRAN-ODT) 4 MG disintegrating tablet Take 4 mg by mouth every 8 (eight) hours as needed for nausea.       oxyCODONE-acetaminophen (PERCOCET/ROXICET) 5-325 MG per tablet Take 1 tablet by mouth every 4 (four) hours as needed for pain.       oxyCODONE-acetaminophen (PERCOCET/ROXICET) 5-325 MG per tablet Take 1 tablet by mouth every 4 (four) hours as needed for pain.  12 tablet  0   tamsulosin (FLOMAX) 0.4 MG CAPS capsule Take 0.4 mg by mouth daily.       No current facility-administered medications for this visit.   Family History  Problem Relation Age of Onset   Insulin resistance Mother 72   Colon cancer Father 28   Heart attack Father 14   Stomach cancer Neg Hx    History   Social History   Marital Status: Divorced    Spouse Name: N/A    Number of Children: N/A   Years of Education: 12   Social History Main Topics   Smoking status: Current Every Day Smoker -- 0.50 packs/day for 30 years    Types: Cigarettes   Smokeless tobacco: Never Used   Alcohol Use: Yes     Comment: rarely.   Drug Use: No   Sexually Active: None   Other Topics Concern   None   Social History Narrative   None   Review of Systems: Constitutional: Reprots fever that is unrecorded, fatigue belived to be 2/2 to lack of sleep.  Eyes: positive for cataracts and is scheduled to see ophthamologist later this month per patient. Ears, nose, mouth, throat, and face: positive for nasal congestion Respiratory: positive for dyspnea on exertion and denies being able to walk up 1 flight of stairs without experiencing  SOB. Cardiovascular: positive for chest pressure/discomfort and is thought to be referred pain from nephroliathiasis per patient.  Gastrointestinal: positive for abdominal pain, nausea and vomiting Genitourinary:positive for decreased stream, dysuria and microscopic hematuria per ED. Musculoskeletal:positive for back pain Neurological: negative for nephropathy in extremitis.  Objective:  Physical Exam: Filed Vitals:   05/11/13 1000  BP: 131/77  Pulse: 85  Temp: 97.3 F (36.3 C)  TempSrc: Oral  Height: 5\' 10"  (1.778 m)  Weight: 224 lb (101.606 kg)  SpO2: 100%   General appearance: alert, cooperative and no distress Head: Normocephalic, without obvious abnormality, atraumatic, PERRL, EOMI.  Back:  Tenderness to palpation, no signs of erythema or edema noted.  Lungs: clear to auscultation bilaterally Chest wall: no tenderness Heart: regular rate and rhythm, S1, S2 normal, no murmur, click, rub or gallop Abdomen: abnormal findings:  guarding, hypoactive bowel sounds and marked tenderness in the LLQ Extremities: extremities normal, atraumatic, no cyanosis or edema and diabetic exam performed without findings of ucleration. Callous is present on the medial-inferior aspect of bilateral feet.  Lymph nodes: No inguinal adenopathy appreciated.  Neurologic: CN II-XII are grossly intact.   Assessment & Plan:  1. Likely presence of left ureter nephrolithiasis continuing to obstruct left ureter. BMP and UA obtained today revealing a further elevated creatinine of 1.42. Attempted to call [facility] to see if patient could be evaluated today, however they will not be able to see him. Plan to admit patient for pain control, evaluation of AKI and urology consultation.   2. A1c obtained today revealing a reduction from 10.5 (01/03/13) to 8.6 (05/11/13). Dicussed with patient the importance of exercise and diet. He is currently eating a pop-tart for breakfast and then waiting until dinner to have a  large meal. He has stopped drinking soft drinks all together and only drinks water. He is taking his Metformin and NPH(70/30) as prescribed. He should be scheduled for an appointment in the future for evaluation and potential alteration of his dosages/medications for better glycemic control. Refilled patient's NPH (70/30) and Metformin today.

## 2013-05-12 ENCOUNTER — Encounter (HOSPITAL_COMMUNITY)
Admission: AD | Disposition: A | Discharge status: Home / Self Care | Payer: Self-pay | Source: Ambulatory Visit | Attending: Internal Medicine

## 2013-05-12 ENCOUNTER — Encounter (HOSPITAL_COMMUNITY): Payer: Self-pay | Admitting: *Deleted

## 2013-05-12 ENCOUNTER — Encounter (HOSPITAL_COMMUNITY): Payer: Self-pay

## 2013-05-12 ENCOUNTER — Observation Stay (HOSPITAL_COMMUNITY): Payer: No Typology Code available for payment source | Admitting: *Deleted

## 2013-05-12 DIAGNOSIS — N179 Acute kidney failure, unspecified: Secondary | ICD-10-CM

## 2013-05-12 DIAGNOSIS — N200 Calculus of kidney: Secondary | ICD-10-CM

## 2013-05-12 HISTORY — PX: CYSTOSCOPY WITH URETEROSCOPY AND STENT PLACEMENT: SHX6377

## 2013-05-12 HISTORY — PX: HOLMIUM LASER APPLICATION: SHX5852

## 2013-05-12 LAB — CBC
HCT: 37 % — ABNORMAL LOW (ref 39.0–52.0)
Hemoglobin: 12.7 g/dL — ABNORMAL LOW (ref 13.0–17.0)
MCH: 30.9 pg (ref 26.0–34.0)
MCHC: 34.3 g/dL (ref 30.0–36.0)
RBC: 4.11 MIL/uL — ABNORMAL LOW (ref 4.22–5.81)

## 2013-05-12 LAB — BASIC METABOLIC PANEL
BUN: 15 mg/dL (ref 6–23)
BUN: 15 mg/dL (ref 6–23)
Calcium: 9.2 mg/dL (ref 8.4–10.5)
Chloride: 100 mEq/L (ref 96–112)
GFR calc Af Amer: 61 mL/min — ABNORMAL LOW (ref >90)
GFR calc Af Amer: 59 mL/min — ABNORMAL LOW (ref >90)
GFR calc non Af Amer: 51 mL/min — ABNORMAL LOW (ref >90)
Glucose, Bld: 138 mg/dL — ABNORMAL HIGH (ref 70–99)
Glucose, Bld: 161 mg/dL — ABNORMAL HIGH (ref 70–99)
Potassium: 4 mEq/L (ref 3.5–5.1)
Potassium: 4 mEq/L (ref 3.5–5.1)
Sodium: 136 mEq/L (ref 135–145)

## 2013-05-12 LAB — GLUCOSE, CAPILLARY
Glucose-Capillary: 131 mg/dL — ABNORMAL HIGH (ref 70–99)
Glucose-Capillary: 130 mg/dL — ABNORMAL HIGH (ref 70–99)
Glucose-Capillary: 160 mg/dL — ABNORMAL HIGH (ref 70–99)
Glucose-Capillary: 102 mg/dL — ABNORMAL HIGH (ref 70–99)
Glucose-Capillary: 127 mg/dL — ABNORMAL HIGH (ref 70–99)
Glucose-Capillary: 109 mg/dL — ABNORMAL HIGH (ref 70–99)
Glucose-Capillary: 119 mg/dL — ABNORMAL HIGH (ref 70–99)

## 2013-05-12 SURGERY — CYSTOURETEROSCOPY, WITH RETROGRADE PYELOGRAM AND STENT INSERTION
Anesthesia: Choice | Laterality: Left

## 2013-05-12 SURGERY — CYSTOURETEROSCOPY, WITH STENT INSERTION
Anesthesia: General | Laterality: Left | Wound class: Clean Contaminated

## 2013-05-12 MED ORDER — OXYCODONE-ACETAMINOPHEN 5-325 MG PO TABS
1.0000 | ORAL_TABLET | ORAL | Status: DC | PRN
  Administered 2013-05-13: 1 via ORAL
  Filled 2013-05-12: qty 1

## 2013-05-12 MED ORDER — SODIUM CHLORIDE 0.9 % IV SOLN: 250.0000 mL | INTRAVENOUS | Status: DC | PRN

## 2013-05-12 MED ORDER — CIPROFLOXACIN IN D5W 400 MG/200ML IV SOLN
400.0000 mg | INTRAVENOUS | Status: AC
  Administered 2013-05-12: 400 mg via INTRAVENOUS
  Filled 2013-05-12: qty 200

## 2013-05-12 MED ORDER — ACETAMINOPHEN 325 MG PO TABS: 650.0000 mg | ORAL_TABLET | ORAL | Status: DC | PRN

## 2013-05-12 MED ORDER — IOHEXOL 300 MG/ML  SOLN
INTRAMUSCULAR | Status: DC | PRN
  Administered 2013-05-12: 50 mL via URETHRAL

## 2013-05-12 MED ORDER — MAGNESIUM HYDROXIDE 400 MG/5ML PO SUSP: 30.0000 mL | Freq: Every day | ORAL | Status: DC | PRN

## 2013-05-12 MED ORDER — ONDANSETRON 4 MG PO TBDP: 4.0000 mg | ORAL_TABLET | Freq: Three times a day (TID) | ORAL | Status: DC | PRN

## 2013-05-12 MED ORDER — OXYCODONE-ACETAMINOPHEN 5-325 MG PO TABS: 1.0000 | ORAL_TABLET | ORAL | Status: DC | PRN

## 2013-05-12 MED ORDER — PHENAZOPYRIDINE HCL 200 MG PO TABS: 200.0000 mg | ORAL_TABLET | Freq: Three times a day (TID) | ORAL | Status: DC | PRN

## 2013-05-12 MED ORDER — ONDANSETRON HCL 4 MG/2ML IJ SOLN
INTRAMUSCULAR | Status: DC | PRN
  Administered 2013-05-12 (×2): 2 mg via INTRAVENOUS

## 2013-05-12 MED ORDER — ONDANSETRON HCL 4 MG/2ML IJ SOLN: 4.0000 mg | Freq: Four times a day (QID) | INTRAMUSCULAR | Status: DC | PRN

## 2013-05-12 MED ORDER — FENTANYL CITRATE 0.05 MG/ML IJ SOLN: 25.0000 ug | INTRAMUSCULAR | Status: DC | PRN

## 2013-05-12 MED ORDER — SODIUM CHLORIDE 0.9 % IR SOLN
Status: DC | PRN
  Administered 2013-05-12: 3000 mL via INTRAVESICAL

## 2013-05-12 MED ORDER — PHENAZOPYRIDINE HCL 200 MG PO TABS
200.0000 mg | ORAL_TABLET | Freq: Three times a day (TID) | ORAL | Status: DC | PRN
  Administered 2013-05-13: 200 mg via ORAL
  Filled 2013-05-12: qty 1

## 2013-05-12 MED ORDER — HYOSCYAMINE SULFATE 0.125 MG SL SUBL
0.1250 mg | SUBLINGUAL_TABLET | Freq: Four times a day (QID) | SUBLINGUAL | Status: DC | PRN
  Administered 2013-05-13: 0.125 mg via SUBLINGUAL
  Filled 2013-05-12: qty 1

## 2013-05-12 MED ORDER — HYDROMORPHONE HCL PF 1 MG/ML IJ SOLN: 0.2500 mg | INTRAMUSCULAR | Status: DC | PRN

## 2013-05-12 MED ORDER — LIDOCAINE HCL 2 % EX GEL
CUTANEOUS | Status: DC | PRN
  Administered 2013-05-12: 1 via URETHRAL

## 2013-05-12 MED ORDER — ACETAMINOPHEN 650 MG RE SUPP: 650.0000 mg | RECTAL | Status: DC | PRN

## 2013-05-12 MED ORDER — MIDAZOLAM HCL 5 MG/5ML IJ SOLN
INTRAMUSCULAR | Status: DC | PRN
  Administered 2013-05-12 (×2): 1 mg via INTRAVENOUS

## 2013-05-12 MED ORDER — BELLADONNA ALKALOIDS-OPIUM 16.2-60 MG RE SUPP
RECTAL | Status: DC | PRN
  Administered 2013-05-12: 1 via RECTAL

## 2013-05-12 MED ORDER — LACTATED RINGERS IV SOLN
INTRAVENOUS | Status: DC | PRN
  Administered 2013-05-12: 20:00:00 via INTRAVENOUS

## 2013-05-12 MED ORDER — SODIUM CHLORIDE 0.9 % IV SOLN
INTRAVENOUS | Status: DC
  Administered 2013-05-12 (×3): via INTRAVENOUS

## 2013-05-12 MED ORDER — SODIUM CHLORIDE 0.9 % IJ SOLN
3.0000 mL | Freq: Two times a day (BID) | INTRAMUSCULAR | Status: DC
  Administered 2013-05-13: 3 mL via INTRAVENOUS

## 2013-05-12 MED ORDER — KETOROLAC TROMETHAMINE 30 MG/ML IJ SOLN
INTRAMUSCULAR | Status: DC | PRN
  Administered 2013-05-12: 30 mg via INTRAVENOUS

## 2013-05-12 MED ORDER — SODIUM CHLORIDE 0.9 % IJ SOLN: 3.0000 mL | INTRAMUSCULAR | Status: DC | PRN

## 2013-05-12 MED ORDER — SODIUM CHLORIDE 0.9 % IR SOLN
Status: DC | PRN
  Administered 2013-05-12: 1000 mL

## 2013-05-12 MED ORDER — HYDROMORPHONE HCL PF 1 MG/ML IJ SOLN
1.0000 mg | INTRAMUSCULAR | Status: DC | PRN
  Administered 2013-05-12 – 2013-05-13 (×3): 1 mg via INTRAVENOUS
  Filled 2013-05-12 (×3): qty 1

## 2013-05-12 MED ORDER — HYOSCYAMINE SULFATE 0.125 MG SL SUBL: 0.1250 mg | SUBLINGUAL_TABLET | SUBLINGUAL | Status: DC | PRN

## 2013-05-12 MED ORDER — MEPERIDINE HCL 50 MG/ML IJ SOLN: 6.2500 mg | INTRAMUSCULAR | Status: DC | PRN

## 2013-05-12 MED ORDER — OXYCODONE HCL 5 MG PO TABS
5.0000 mg | ORAL_TABLET | ORAL | Status: DC | PRN
  Administered 2013-05-12: 10 mg via ORAL
  Filled 2013-05-12: qty 2

## 2013-05-12 MED ORDER — PROPOFOL 10 MG/ML IV BOLUS
INTRAVENOUS | Status: DC | PRN
  Administered 2013-05-12: 25 mg via INTRAVENOUS
  Administered 2013-05-12: 175 mg via INTRAVENOUS

## 2013-05-12 MED ORDER — DOCUSATE SODIUM 100 MG PO CAPS
100.0000 mg | ORAL_CAPSULE | Freq: Two times a day (BID) | ORAL | Status: DC
  Administered 2013-05-12 – 2013-05-13 (×2): 100 mg via ORAL
  Filled 2013-05-12 (×4): qty 1

## 2013-05-12 MED ORDER — LIDOCAINE HCL (CARDIAC) 20 MG/ML IV SOLN
INTRAVENOUS | Status: DC | PRN
  Administered 2013-05-12: 75 mg via INTRAVENOUS

## 2013-05-12 MED ORDER — INSULIN GLARGINE 100 UNIT/ML ~~LOC~~ SOLN
10.0000 [IU] | Freq: Every day | SUBCUTANEOUS | Status: DC
  Filled 2013-05-12 (×2): qty 0.1

## 2013-05-12 MED ORDER — METOCLOPRAMIDE HCL 5 MG/ML IJ SOLN: 10.0000 mg | Freq: Once | INTRAMUSCULAR | Status: DC | PRN

## 2013-05-12 MED ORDER — FENTANYL CITRATE 0.05 MG/ML IJ SOLN
INTRAMUSCULAR | Status: DC | PRN
  Administered 2013-05-12 (×3): 50 ug via INTRAVENOUS

## 2013-05-12 SURGICAL SUPPLY — 19 items
BAG URO CATCHER STRL LF (DRAPE) ×2 IMPLANT
BASKET ZERO TIP NITINOL 2.4FR (BASKET) ×1 IMPLANT
BSKT STON RTRVL ZERO TP 2.4FR (BASKET) ×1
CATH URET 5FR 28IN OPEN ENDED (CATHETERS) ×2 IMPLANT
CLOTH BEACON ORANGE TIMEOUT ST (SAFETY) ×2 IMPLANT
DRAPE CAMERA CLOSED 9X96 (DRAPES) ×2 IMPLANT
GLOVE SURG SS PI 8.0 STRL IVOR (GLOVE) ×2 IMPLANT
GOWN PREVENTION PLUS XLARGE (GOWN DISPOSABLE) ×2 IMPLANT
GOWN STRL REIN XL XLG (GOWN DISPOSABLE) ×2 IMPLANT
GUIDEWIRE STR DUAL SENSOR (WIRE) ×1 IMPLANT
KIT BALLIN UROMAX 15FX10 (LABEL) IMPLANT
LASER FIBER DISP (UROLOGICAL SUPPLIES) ×1 IMPLANT
MANIFOLD NEPTUNE II (INSTRUMENTS) ×2 IMPLANT
MARKER SKIN DUAL TIP RULER LAB (MISCELLANEOUS) ×2 IMPLANT
PACK CYSTO (CUSTOM PROCEDURE TRAY) ×2 IMPLANT
SET HIGH PRES BAL DIL (LABEL) ×1
SHEATH ACCESS URETERAL 54CM (SHEATH) ×1 IMPLANT
STENT CONTOUR 6FRX26X.038 (STENTS) ×1 IMPLANT
TUBING CONNECTING 10 (TUBING) ×2 IMPLANT

## 2013-05-12 NOTE — Preoperative (Signed)
Beta Blockers   Reason not to administer Beta Blockers:Not Applicable 

## 2013-05-12 NOTE — Anesthesia Preprocedure Evaluation (Signed)
Anesthesia Evaluation  Patient identified by MRN, date of birth, ID band Patient awake    Reviewed: Allergy & Precautions, H&P , NPO status , Patient's Chart, lab work & pertinent test results  Airway Mallampati: III TM Distance: >3 FB Neck ROM: Full    Dental no notable dental hx. (+) Teeth Intact   Pulmonary neg pulmonary ROS,  breath sounds clear to auscultation  Pulmonary exam normal       Cardiovascular hypertension, Pt. on medications + CAD and + Past MI Rhythm:Regular Rate:Normal  No recent CP Hx/o nonSTEMI in Sept. 2013 No hx/o CHF   Neuro/Psych PSYCHIATRIC DISORDERS negative neurological ROS     GI/Hepatic GERD-  Medicated and Controlled,  Endo/Other  diabetes, Poorly Controlled, Type 2, Insulin Dependent and Oral Hypoglycemic Agents  Renal/GU Renal diseaseHx/o Renal Calculi  negative genitourinary   Musculoskeletal negative musculoskeletal ROS (+)   Abdominal (+) + obese,   Peds  Hematology negative hematology ROS (+)   Anesthesia Other Findings   Reproductive/Obstetrics                           Anesthesia Physical Anesthesia Plan  ASA: III  Anesthesia Plan: General   Post-op Pain Management:    Induction: Intravenous  Airway Management Planned: LMA  Additional Equipment:   Intra-op Plan:   Post-operative Plan: Extubation in OR  Informed Consent: I have reviewed the patients History and Physical, chart, labs and discussed the procedure including the risks, benefits and alternatives for the proposed anesthesia with the patient or authorized representative who has indicated his/her understanding and acceptance.     Plan Discussed with: Anesthesiologist, CRNA and Surgeon  Anesthesia Plan Comments:         Anesthesia Quick Evaluation

## 2013-05-12 NOTE — Brief Op Note (Signed)
05/11/2013 - 05/12/2013  9:18 PM  PATIENT:  Benjamin Baker  57 y.o. male  PRE-OPERATIVE DIAGNOSIS:  left ureteral stone  POST-OPERATIVE DIAGNOSIS:  left ureteral stone  PROCEDURE:  Procedure(s): CYSTOSCOPY WITH RETROGRADE PYELOGRAM,  URETEROSCOPY with STONE EXTRACTION AND STENT PLACEMENT (Left) HOLMIUM LASER APPLICATION (Left)  SURGEON:  Surgeon(s) and Role:    * Anner Crete, MD - Primary  PHYSICIAN ASSISTANT:   ASSISTANTS: none   ANESTHESIA:   general  EBL:  Total I/O In: 50 [I.V.:50] Out: -   BLOOD ADMINISTERED:none  DRAINS: 6x25fr left JJ stent   LOCAL MEDICATIONS USED:  LIDOCAINE   SPECIMEN:  Source of Specimen:  ureteral stone fragments  DISPOSITION OF SPECIMEN:  to family to bring to office  COUNTS:  YES  TOURNIQUET:  * No tourniquets in log *  DICTATION: .Other Dictation: Dictation Number [id number]  PLAN OF CARE: Discharge to home after PACU  PATIENT DISPOSITION:  PACU - hemodynamically stable.   Delay start of Pharmacological VTE agent (>24hrs) due to surgical blood loss or risk of bleeding: not applicable

## 2013-05-12 NOTE — Op Note (Signed)
Benjamin Baker, Benjamin Baker               ACCOUNT NO.:  [id number]  MEDICAL RECORD NO.:  [id number]  LOCATION:  [facility]                         FACILITY:  [facility]  PHYSICIAN:  Excell Seltzer. Annabell Howells, M.D.    DATE OF BIRTH:  1956-02-21  DATE OF PROCEDURE:  05/12/2013 DATE OF DISCHARGE:                              OPERATIVE REPORT   PROCEDURE:  Cystoscopy with left retrograde pyelogram, left ureteroscopic stone extraction with holmium laser lithotripsy, placement of left double-J stent.  PREOPERATIVE DIAGNOSIS:  6 x 5 x 8 mm left proximal stone.  POSTOPERATIVE DIAGNOSIS:  6 x 5 x 8 mm left proximal stone.  SURGEON:  Excell Seltzer. Annabell Howells, M.D.  ANESTHESIA:  General.  SPECIMEN:  Stone fragments.  DRAINS:  A 6-French 26-cm double-J stent.  BLEEDING:  Minimal.  COMPLICATIONS:  None.  INDICATIONS:  Benjamin Baker is a 57 year old white male, who had the onset earlier this week of severe left flank pain.  He was found to have a 5 x 6 x 8 mm stone in the left proximal ureter with some obstruction.  He was sent home, but then bounced back, and was admitted for pain control. After reviewing the options, he was elected to undergo ureteroscopy and risks were reviewed along with the potential need for stent and secondary procedures.  FINDINGS OF PROCEDURE:  He was given Cipro, was taken to the operating room where general anesthetic was induced.  He was placed in lithotomy position.  His perineum and genitalia were prepped with Betadine solution.  He was draped in usual sterile fashion.  Cystoscopy was performed using a 22-French scope and 12-degree lens. Examination revealed a normal urethra.  The external sphincter was intact.  The prostatic urethra short without obstruction.  Bladder wall was smooth and pale without tumor, stones, or inflammation.  Ureteral orifices were unremarkable.  The left ureteral orifice was cannulated with 5-French open-end catheter and contrast was instilled and retrograde pyelogram  was performed  The retrograde pyelogram revealed a normal, but somewhat delicate ureter to the level of the stone at the L3-L4 interspace.  There was a filling defect consistent with stone in this area and some mild dilation proximally.  Once retrograde pyelogram was completed a Sensor guidewire was passed to the kidney without difficulty.  The cystoscope was removed and a 55 cm digital access sheath inner core was used to attempt dilation of the ureter.  I was able to pass it easily to approximate the level of the iliacs were it would not progress.  At this point, a 10 cm 15-French balloon dilation catheter was passed over the wire and placed across the midureter and inflated to 18 atmospheres.  No evidence of a waist.  The balloon was then advanced to just below the kidney reinflated and then reinflated in the distal ureter to ensure proper dilation.  At this point, a 6-French short ureteroscope was inserted over the wire, and I was able to advance this.  Actually this was done prior to the balloon dilation, I was only able to advance it to the iliacs and a 2nd guidewire was placed through the scope to the kidney as well to work as a Museum/gallery conservator.  Then, after dilation the 55 cm access sheath was reassembled and inserted over the wire was advanced easily up to just below the stone and inner core and wire were removed leaving only the safety wire and access sheath in place.  The digital flexible ureteroscope was then assembled and inserted and advanced to the stone.  The stone actually pushed back into the renal pelvis where it was actually in a better position for treatment.  A 200 micron laser fiber was passed through the ureteroscope and the stone was engaged initially at 0.5 joules and 10 hertz, and eventually 1 joule and 10 hertz due to the hardness of the stone.   Once the stone was adequately fragmented, a 0-tip Nitinol basket was used to remove the major residual  fragments and small sand and dust was left in the renal collecting system.  I did inspect the collecting system and no significant fragments were identified.  Once this had been completed, the ureteroscope was backed out under visual inspection of the ureter.  There was some mucosal splitting in the midureter from the dilation and it was felt that stenting was indicated.  Once the access sheath was removed.  The cystoscope was reinserted over the safety wire, and a 6-French 26 cm double-J stent was inserted to the kidney under fluoroscopic guidance.  The wire was removed leaving good coil in the kidney and a good coil in the bladder.  The bladder was then drained and the cystoscope was removed.  The urethra was instilled with 10 mL of 2% lidocaine jelly, which was then secured with the gentle 4 x 4 tourniquet.  A B and O suppository was placed.  He was given IV Toradol.  He was taken down from lithotomy position prior to moving to the recovery room.  The gauze was removed from the penis.  He was taken to recovery room in stable condition.  There were no complications.  He will be discharged home. If he does not have excess postop pain and will follow up with me in the office in 10-14 days for stent removal.  I wanted more prolonged time because of the mucosal tear.  There no complications.     Excell Seltzer. Annabell Howells, M.D.     JJW/MEDQ  D:  05/12/2013  T:  05/12/2013  Job:  [id number]

## 2013-05-12 NOTE — Discharge Summary (Signed)
This is the interim discharge summary.  Patient is transfered to urology service under  Benjamin Baker    Name: Benjamin Baker MRN: [id number] DOB: [birth year] 57 y.o. PCP: Benjamin Barrack, MD  Date of Admission: 05/11/2013  4:53 PM Date of transfer 05/12/2013  Attending Physician: Benjamin Blue, DO  Discharge Diagnosis: 1. Symptomatic Nephrolithiasis  Principal Problem:   Nephrolithiasis Active Problems:   DIABETES MELLITUS, TYPE II   DYSLIPIDEMIA   TOBACCO ABUSE   HYPERTENSION   GERD   CORONARY ARTERY BYPASS GRAFT, THREE VESSEL, HX OF   Acute kidney injury  Discharge Medications:   Medication List    ASK your doctor about these medications       insulin NPH-regular (70-30) 100 UNIT/ML injection  Commonly known as:  NOVOLIN 70/30  Inject 40-45 Units into the skin 2 (two) times daily with a meal. Use 45 units every morning and use 40 units every night     metFORMIN 1000 MG tablet  Commonly known as:  GLUCOPHAGE  Take 1,000 mg by mouth 2 (two) times daily with a meal.     metoCLOPramide 10 MG tablet  Commonly known as:  REGLAN  Take 10 mg by mouth every 6 (six) hours.     nitroGLYCERIN 0.4 MG SL tablet  Commonly known as:  NITROSTAT  Place 0.4 mg under the tongue every 5 (five) minutes as needed for chest pain. x3 doses as needed for chest pain     ondansetron 4 MG disintegrating tablet  Commonly known as:  ZOFRAN-ODT  Take 4 mg by mouth every 8 (eight) hours as needed for nausea. For nausea/vomiting     oxyCODONE-acetaminophen 5-325 MG per tablet  Commonly known as:  PERCOCET/ROXICET  Take 1 tablet by mouth every 4 (four) hours as needed for pain.     tamsulosin 0.4 MG Caps capsule  Commonly known as:  FLOMAX  Take 0.4 mg by mouth daily.        Disposition and follow-up:  Defer to Urology [name] was TRANSFERRED  from [facility] in stable condition to [facility].  At the hospital follow up visit please address:  1.   Acute Kidney injury, Diabetes Mellitus   2.  Labs / imaging needed at time of follow-up: BMP  3.  Pending labs/ test needing follow-up: urine sodium and urine creatinine  Follow-up Appointments: With PCP week of 8/11 to check BMP   Discharge Instructions:    Consultations: Treatment Team:  Anner Crete, MD  Procedures Performed:  Ct Abdomen Pelvis Wo Contrast  05/09/2013   *RADIOLOGY REPORT*  Clinical Data: Left flank and left groin pain, hematuria, some shortness of breath, history of prior kidney stones  CT ABDOMEN AND PELVIS WITHOUT CONTRAST  Technique:  Multidetector CT imaging of the abdomen and pelvis was performed following the standard protocol without intravenous contrast.  Comparison: None.  Findings: The lung bases are clear.  The liver is unremarkable in the unenhanced state.  No calcified gallstones are seen although there may be sludge layering in the gallbladder.  The pancreas is normal in size and the pancreatic duct is not dilated.  The adrenal glands and spleen are unremarkable.  The stomach is decompressed.  No definite renal calculi are seen.  The right pelvocaliceal system is unremarkable.  However, there is slight fullness of the left pelvocaliceal system to a point of low grade obstruction by a proximal left ureteral calculus of 6 x 5  x 8 mm.  The abdominal aorta is normal in caliber with mild atheromatous change present for age.  The distal ureters are normal in caliber and no distal ureteral calculus is seen.  The urinary bladder is not well distended.  The prostate is within normal limits in size.  No fluid is seen within the pelvis.  No abnormality of the colon is seen.  The terminal ileum and the appendix are unremarkable.  Fat enters both inguinal canals.  On coronal bone window images, there is irregularity of the right femoral head which could indicate a small focus of avascular necrosis.  A benign-appearing sclerotic bone island is noted in the right iliac wing near the  acetabulum.  The lumbar vertebrae are in normal alignment with degenerative change throughout the facet joints of the lower lumbar spine.  Intervertebral disc spaces are relatively well preserved with mild vacuum disc phenomenon at L5- S1.  IMPRESSION:  1.  Low grade obstruction of the left kidney by a proximal left ureteral calculus of 6 x 5 x 8 mm. 2.  No other renal calculi are seen. 3.  Question of a small focus of avascular necrosis of the right femoral head. 4.  Degenerative change throughout the facet joints of the lower lumbar spine.   Original Report Authenticated By: Dwyane Dee, M.D.   Dg Chest Port 1 View  05/09/2013   *RADIOLOGY REPORT*  Clinical Data: Chest and low back pain.  PORTABLE CHEST - 1 VIEW  Comparison: Plain film chest 06/28/2012.  Findings: Lungs are clear.  Heart size is normal.  No pneumothorax or pleural effusion.  The patient is status post CABG.  IMPRESSION: No acute disease.   Original Report Authenticated By: Holley Dexter, M.D.    2D Echo: none  Cardiac Cath: none  Admission HPI:  Pt is a 57 yo M with pmh of HTN, HL, CAD s/p 3 vessel CABG 6/13 with EF 55-60%, on 9/13, T2DM, GERD, NSTEMI 06/2012 with nephrolithiasis 25 years ago and recent ureteral calculi who presents with worsening left sided flank pain with radiation to groin for the past two days. Pt reports he awoke Tuesday night at approximately 3AM with constant and stabbing 10/10 left flank pain with radiation to groin. He also reports subjective fever with rigors, dyspnea, CP, nausea, NBNB emesis, urinary frequency, and decreased urine output. No gross hematuria. He was seen at [facility] ED that night for worsening pain with associated CP and dyspnea that he described was similar to his last MI. Cardiac evaluation for ACS was unremarkable. He was diagnosed with urinary tract obstruction by kidney stone due to CT abdomen findings of evidence of low grade obstruction of the left kidney by a proximal left ureteral calculus  of 6 x 5 x 8 mm and UA findings of microscopic hematuria. He was discharged home with percocet, zofran, flomax, and instructed on usage of urine strainer. Pt reports eating [employer] chicken sandwich that day and experiencing nausea and vomiting with worsening pain unrelieved by medications. He subsequently returned to the ED where it was found his renal function had worsened. He was discharged home on pain medication (incrased in dosage) and advised to follow-up with urology but unable to so due to insurance issues. His pain has been worsening since then and today complains of constant 10/10 stabbing pain in his left flank region with radiation to his left groin along with urinary frequency and decreased urine output. He reports he has been unable to eat or drink much due  to pain and nausea. Pt reports that 25 years ago he had a kidney stone which he does believe required surgical intervention.    Hospital Course by problem list: Principal Problem:   Nephrolithiasis Active Problems:   DIABETES MELLITUS, TYPE II   DYSLIPIDEMIA   TOBACCO ABUSE   HYPERTENSION   GERD   CORONARY ARTERY BYPASS GRAFT, THREE VESSEL, HX OF   Acute kidney injury   1. Symptomatic Nephrolithiasis - Pt presented with left sided flank pain with radiation to groin, nausea, vomiting, rigors, CVA tenderness, and urinary symptoms due to presence of low grade obstruction of the left kidney by a proximal left ureteral calculus of 6 x 5 x 8 mm seen on CT imaging on previous ED admission on 05/09/2013. Urine analysis with glucosuria but otherwise unremarkable. Pt was treated with conservative management with NS IV fluids for hydration and pain control with morphine and dilaudid. No passage of stone was reported and patient still with pain, somewhat improved. Urology (Dr. Annabell Howells) was consulted who did not deem him a good candidate for ESWL. His recommendations were to perform ureteroscopic extraction with laser and possible stenting. He is to  be transferred to [facility] for surgical intervention. He was put on NPO status after 12:00pm today and received SQ heparin DVT prophylaxis at 9:00pm and 7:00AM this morning. PT, PTT, and platlets were within normal limit with no concern for abnormal bleeding in surgery.    2. Acute Kidney Injury -  Pt presented with rising creatinine since 8/6, unclear if patient at baseline has CKD. Possibly renal vs post-renal in etiology, did not improve with fluids.       3. Diabetes Mellitus Type II Uncontrolled with last HbA1c on Aug 8, at 8.6. Blood glucose levels ranged from 113-161 during hospital course.  Pt received insulin therapy during hospitalization.    4. Hypertension - Pt with asymptomatic hypertension (144/73-170/87) most likely due to pain. There was no need for antihypertensive management during hospitalization.    5. CAD s/p 3-vessel coronary bypass (03/2012) and NSTEMI (06/2012) with EF 55-60% - No complaints of chest pain during hospitalization.  Currently not on aspirin, beta blocker, or statin at home.    6. Dyslipidemia - Pt with elevated cholesterol, triglycerides, LDL, and HDL in 06/2012, not currently on medical therapy.   7. GERD - Patient received protonix for management of chronic acid reflux.   8. Tobacco abuse - Pt with no complaints concerning for withdrawal. Did not require nicotine replacement patch during hospitalization.      Discharge Vitals:   BP 144/73  Pulse 80  Temp(Src) 98 F (36.7 C) (Oral)  Resp 18  Ht 5\' 9"  (1.753 m)  Wt 215 lb 6.2 oz (97.7 kg)  BMI 31.79 kg/m2  SpO2 100%  Discharge Labs:  Results for orders placed during the hospital encounter of 05/11/13 (from the past 24 hour(s))  MAGNESIUM     Status: None   Collection Time    05/11/13  8:37 PM      Result Value Range   Magnesium 1.5  1.5 - 2.5 mg/dL  PROTIME-INR     Status: None   Collection Time    05/11/13  8:37 PM      Result Value Range   Prothrombin Time 13.2  11.6 - 15.2  seconds   INR 1.02  0.00 - 1.49  APTT     Status: None   Collection Time    05/11/13  8:37 PM  Result Value Range   aPTT 29  24 - 37 seconds  GLUCOSE, CAPILLARY     Status: Abnormal   Collection Time    05/11/13 10:12 PM      Result Value Range   Glucose-Capillary 184 (*) 70 - 99 mg/dL  GLUCOSE, CAPILLARY     Status: Abnormal   Collection Time    05/11/13 11:36 PM      Result Value Range   Glucose-Capillary 138 (*) 70 - 99 mg/dL   Comment 1 Documented in Chart     Comment 2 Notify RN    GLUCOSE, CAPILLARY     Status: Abnormal   Collection Time    05/12/13  4:17 AM      Result Value Range   Glucose-Capillary 131 (*) 70 - 99 mg/dL  BASIC METABOLIC PANEL     Status: Abnormal   Collection Time    05/12/13  4:23 AM      Result Value Range   Sodium 136  135 - 145 mEq/L   Potassium 4.0  3.5 - 5.1 mEq/L   Chloride 100  96 - 112 mEq/L   CO2 26  19 - 32 mEq/L   Glucose, Bld 138 (*) 70 - 99 mg/dL   BUN 15  6 - 23 mg/dL   Creatinine, Ser 1.61 (*) 0.50 - 1.35 mg/dL   Calcium 8.9  8.4 - 09.6 mg/dL   GFR calc non Af Amer 52 (*) >90 mL/min   GFR calc Af Amer 61 (*) >90 mL/min  CBC     Status: Abnormal   Collection Time    05/12/13  4:23 AM      Result Value Range   WBC 10.6 (*) 4.0 - 10.5 K/uL   RBC 4.11 (*) 4.22 - 5.81 MIL/uL   Hemoglobin 12.7 (*) 13.0 - 17.0 g/dL   HCT 04.5 (*) 40.9 - 81.1 %   MCV 90.0  78.0 - 100.0 fL   MCH 30.9  26.0 - 34.0 pg   MCHC 34.3  30.0 - 36.0 g/dL   RDW 91.4  78.2 - 95.6 %   Platelets 213  150 - 400 K/uL  GLUCOSE, CAPILLARY     Status: Abnormal   Collection Time    05/12/13  7:50 AM      Result Value Range   Glucose-Capillary 130 (*) 70 - 99 mg/dL   Comment 1 Notify RN      Signed:   Time Spent on Discharge: 60 minutes

## 2013-05-12 NOTE — Progress Notes (Addendum)
Subjective:  Pt seen and examined in AM. Pt to transfer to [facility] hospital for surgical intervention due to persistence of pain without much relief with pain medications and gentle hydration.     Objective: Vital signs in last 24 hours: Filed Vitals:   05/12/13 2100 05/12/13 2115 05/12/13 2130 05/12/13 2151  BP: 137/70 142/74 146/76 147/74  Pulse: 75 71 80 69  Temp: 97.9 F (36.6 C)  97.7 F (36.5 C) 97.5 F (36.4 C)  TempSrc:      Resp: 15 17 17 18   Height:      Weight:      SpO2: 100% 100% 96% 94%   Weight change:   Intake/Output Summary (Last 24 hours) at 05/12/13 2347 Last data filed at 05/12/13 2215  Gross per 24 hour  Intake 683.33 ml  Output   1625 ml  Net -941.67 ml   General: alert, NAD  Head: normocephalic and atraumatic.  Eyes: EOMI  Neck: supple, full ROM  Lungs: normal respiratory effort, no accessory muscle use, normal breath sounds, no crackles, and no wheezes. Heart: normal rate, regular rhythm, no murmur, no gallop, and no rub.  Abdomen: soft, TTP LLQ, normal bowel sounds, voluntary guarding, no rebound tenderness, left sided CVA tenderness Extremities: No, edema Neurologic: alert & oriented X3      Lab Results: Basic Metabolic Panel:  Recent Labs Lab 05/11/13 1141 05/11/13 2037 05/12/13 0423 05/12/13 1238  NA 136  --  136 138  K 3.6  --  4.0 4.0  CL 100  --  100 101  CO2 27  --  26 24  GLUCOSE 113*  --  138* 161*  BUN 17  --  15 15  CREATININE 1.42*  --  1.47* 1.50*  CALCIUM 9.6  --  8.9 9.2  MG  --  1.5  --   --    Liver Function Tests:  Recent Labs Lab 05/09/13 0505  AST 17  ALT 15  ALKPHOS 128*  BILITOT 0.5  PROT 6.5  ALBUMIN 3.4*   No results found for this basename: LIPASE, AMYLASE,  in the last 168 hours No results found for this basename: AMMONIA,  in the last 168 hours CBC:  Recent Labs Lab 05/09/13 0505  05/10/13 0157 05/12/13 0423  WBC 11.9*  --   --  10.6*  NEUTROABS 8.2*  --   --   --   HGB 13.9  < >  14.6 12.7*  HCT 40.0  < > 43.0 37.0*  MCV 88.9  --   --  90.0  PLT 261  --   --  213  < > = values in this interval not displayed. Cardiac Enzymes: No results found for this basename: CKTOTAL, CKMB, CKMBINDEX, TROPONINI,  in the last 168 hours BNP: No results found for this basename: PROBNP,  in the last 168 hours D-Dimer: No results found for this basename: DDIMER,  in the last 168 hours CBG:  Recent Labs Lab 05/12/13 0750 05/12/13 1209 05/12/13 1556 05/12/13 2106 05/12/13 2227 05/12/13 2329  GLUCAP 130* 160* 102* 127* 109* 119*   Hemoglobin A1C:  Recent Labs Lab 05/11/13 1025  HGBA1C 8.6   Fasting Lipid Panel: No results found for this basename: CHOL, HDL, LDLCALC, TRIG, CHOLHDL, LDLDIRECT,  in the last 168 hours Thyroid Function Tests: No results found for this basename: TSH, T4TOTAL, FREET4, T3FREE, THYROIDAB,  in the last 168 hours Coagulation:  Recent Labs Lab 05/11/13 2037  LABPROT 13.2  INR 1.02  Anemia Panel: No results found for this basename: VITAMINB12, FOLATE, FERRITIN, TIBC, IRON, RETICCTPCT,  in the last 168 hours Urine Drug Screen: Drugs of Abuse  No results found for this basename: labopia, cocainscrnur, labbenz, amphetmu, thcu, labbarb    Alcohol Level: No results found for this basename: ETH,  in the last 168 hours Urinalysis:  Recent Labs Lab 05/09/13 0617 05/11/13 1136  COLORURINE YELLOW YELLOW  LABSPEC 1.030 1.014  PHURINE 7.5 5.5  GLUCOSEU >1000* 100*  HGBUR LARGE* NEG  BILIRUBINUR NEGATIVE NEG  KETONESUR 15* NEG  PROTEINUR 30* NEG  UROBILINOGEN 1.0 0.2  NITRITE NEGATIVE NEG  LEUKOCYTESUR NEGATIVE NEG   Misc. Labs:   Micro Results: No results found for this or any previous visit (from the past 240 hour(s)). Studies/Results: No results found. Medications: I have reviewed the patient's current medications. Scheduled Meds: . docusate sodium  100 mg Oral BID  . insulin aspart  0-9 Units Subcutaneous Q4H  . insulin  glargine  10 Units Subcutaneous QHS  . pantoprazole  40 mg Oral Q breakfast  . sodium chloride  3 mL Intravenous Q12H  . sodium chloride  3 mL Intravenous Q12H  . tamsulosin  0.4 mg Oral Daily   Continuous Infusions: . sodium chloride 100 mL/hr at 05/12/13 2311   PRN Meds:.sodium chloride, acetaminophen, acetaminophen, fentaNYL, HYDROmorphone (DILAUDID) injection, hyoscyamine, magnesium hydroxide, morphine injection, ondansetron (ZOFRAN) IV, ondansetron, oxyCODONE, oxyCODONE-acetaminophen, phenazopyridine, sodium chloride Assessment/Plan: Principal Problem:   Ureteral stone Active Problems:   DIABETES MELLITUS, TYPE II   DYSLIPIDEMIA   TOBACCO ABUSE   HYPERTENSION   GERD   CORONARY ARTERY BYPASS GRAFT, THREE VESSEL, HX OF   Nephrolithiasis   Acute kidney injury Assessment: 57 yo M with pmh of HTN, HL, CAD s/p 3 vessel CABG 6/13 with EF 55-60%, on 9/13, T2DM, GERD, NSTEMI 06/2012 with nephrolithiasis 25 years ago who presents with worsening left sided flank pain with radiation to groin, rigors, nausea, and emeis for two days and found to have mild obstruction of left kidney by left ureteral calculus with worsening renal function.   Symptomatic Nephrolithiasis - CT abdomen findings (8/6) with low grade obstruction of the left kidney by a proximal left ureteral calculus of 6 x 5 x 8 mm and UA findings (8/6) of hematuria. Also with leukocytosis (11.9) with left shift on previous ED admission on 8/6. Pt with symptoms of flank pain, rigors, nausea, vomiting, urinary symptoms and left-sided CVA tenderness. Conservative management with fluids and pain control with close monitoring.   -Continue NS IV fluids  -Pain control with morphine  -Continue flomax, alpha blocker  -Urology consult in AM to decide if surgical intervention needed or conservatieve   AKI - uptrending Cr (0.9 to 1.30 to 1.42) etiology unknown-- likely postrenal due to obstructive uropathy, pre-renal component as well due to  hyovolemia from decreased PO intake and GI loss (emesis). ATN also possible.  -Obtain KUB to assess for hydronephrosis  -Hold nephrotoxic agents (NSAIDS, contrast, ACEi, ARB)  -Measure Urine Na and Urine Cr to calculate FeNa -->FeNa 1.1 most likely renal (ATN) vs post-renal  -Recheck BMP as outpatient for resolution of AKI  Hypertension: currently hypertensive most likely 2/2 to pain  -not currently on any home hypertensive medications  -hydralazine as needed   CAD s/p 3-vessel coronary bypass and NSTEMI - 6/13; EF 55-60% on 9/13 - CP in ED on 8/6 with negative troponins and unremarkable EKG  -Obtain 12-lead EKG  -Not currently at home on aspirin, beta blocker,  or statin  -Lipid panel 06/2012 with elevated total cholesterol (217), triglycerides (399) and LDL (105), low HDL (32)   GERD - controlled  -PO Protonix   Diabetes Mellitus Type II - uncontrolled, HbA1c (8/8) of 8.6, on home metformin 1000mg  BID and NPH 70/30 (45U in AM 40U PM), compliant with medications. No reported complications.  -Obtain CBG checks  - ISS   Tobacco dependence - current smoker  -nicotine patch   Diet: clear liquids  PPx: SQ Heparin    Dispo: Disposition is deferred at this time, awaiting improvement of current medical problems.  Anticipated discharge is today.   The patient does have a current PCP (Vivi Barrack, MD) and does need an [facility] hospital follow-up appointment after discharge.  The patient does have transportation limitations that hinder transportation to clinic appointments.  .Services Needed at time of discharge: Y = Yes, Blank = No PT:   OT:   RN:   Equipment:   Other:     LOS: 1 day   Otis Brace, MD 05/12/2013, 11:47 PM

## 2013-05-12 NOTE — Transfer of Care (Signed)
Immediate Anesthesia Transfer of Care Note  Patient: Benjamin Baker  Procedure(s) Performed: Procedure(s): CYSTOSCOPY WITH URETEROSCOPY AND STENT PLACEMENT (Left) HOLMIUM LASER APPLICATION (Left)  Patient Location: PACU  Anesthesia Type:General  Level of Consciousness: awake, alert  and oriented  Airway & Oxygen Therapy: Patient Spontanous Breathing and Patient connected to face mask oxygen  Post-op Assessment: Report given to PACU RN, Post -op Vital signs reviewed and stable and Patient moving all extremities  Post vital signs: Reviewed and stable  Complications: No apparent anesthesia complications

## 2013-05-12 NOTE — Anesthesia Postprocedure Evaluation (Signed)
  Anesthesia Post-op Note  Patient: Benjamin Baker  Procedure(s) Performed: Procedure(s): CYSTOSCOPY WITH URETEROSCOPY AND STENT PLACEMENT (Left) HOLMIUM LASER APPLICATION (Left)  Patient Location: PACU  Anesthesia Type:General  Level of Consciousness: awake, alert  and oriented  Airway and Oxygen Therapy: Patient Spontanous Breathing  Post-op Pain: none  Post-op Assessment: Post-op Vital signs reviewed, Patient's Cardiovascular Status Stable, Respiratory Function Stable, Patent Airway, No signs of Nausea or vomiting and Pain level controlled  Post-op Vital Signs: Reviewed and stable  Complications: No apparent anesthesia complications

## 2013-05-12 NOTE — H&P (Signed)
INTERNAL MEDICINE TEACHING SERVICE Attending Admission Note  Date: 05/12/2013  Patient name: Benjamin Baker  Medical record number: [id number]  Date of birth: 10/18/1955    I have seen and evaluated Mathews Robinsons and discussed their care with the Residency Team.  56 yr. Old male w/ CAD, s/p CABG, Type 2 DM, hx NSTEMI, HTN, HL, hx ureteral calculi, presented with L flank pain. He is noted to have a 6x5x8 mm left proximal stone with mild hydro.  Her likely has underlying CKD.  He is noted to have AKI.  I would continue to monitor his I/O's. This is unlikely to be pre-renal.  He denies NSAID use.  Possibly a result of obstructive uropathy leading to AKI if he has underlying CKD (only one kidney involved).   He is currently hemodynamically stable. He denies CP, SOB.  Low risk procedure planned by urology. He is low risk for perioperative cardiac complications.  Jonah Blue, DO 8/9/201412:51 PM

## 2013-05-12 NOTE — Anesthesia Procedure Notes (Signed)
Procedure Name: LMA Insertion Date/Time: 05/12/2013 7:54 PM Performed by: Edison Pace Pre-anesthesia Checklist: Patient identified, Timeout performed, Emergency Drugs available, Patient being monitored and Suction available Patient Re-evaluated:Patient Re-evaluated prior to inductionOxygen Delivery Method: Circle system utilized Preoxygenation: Pre-oxygenation with 100% oxygen Intubation Type: IV induction LMA: LMA with gastric port inserted LMA Size: 4.0 Number of attempts: 1 Placement Confirmation: positive ETCO2 and breath sounds checked- equal and bilateral Tube secured with: Tape Dental Injury: Teeth and Oropharynx as per pre-operative assessment

## 2013-05-12 NOTE — Consult Note (Signed)
Subjective: I was asked to see Benjamin Baker in consultation by Dr. Kem Kays for a left ureteral stone.  He had the onset Tuesday night of severe left flank pain with N/V.   He was found to have a 6x5x3mm left proximal stone with mild obstruction.   He was sent home with pain meds but had to return and was admitted for pain control.   He last had pain at 7 am.   The nausea has resolved.  He has no voiding complaints.  He had a stone 25 years ago and can't recall whether it was removed or passed.   His Creatinine has risen slightly as well.    ROS: Negative except He continues to have intermittant pain but none since a shot this morning.  He has no nausea since Weds.  He has had low grade fever.  He had microhematuria but no gross hematuria.  He has no irritative voiding symptoms.   He had CP weds but none since.   He has SOB that is with the pain.     Past Medical History  Diagnosis Date  . Diabetes mellitus   . GERD (gastroesophageal reflux disease)   . CAD (coronary artery disease)     a. 03/2002 - CABG x 3: LIMA > LAD, R Rad > OM2, SVG > PDA.    b. Cath 06/2012 - Significant three-vessel disease, 3/3 grafts patent. LVEF 60-65%   . Hypertension   . Hyperlipidemia   . Tobacco abuse     a. ongoing 06/2012  . NSTEMI (non-ST elevated myocardial infarction) September 2013    grafts patent; EF normal; manage medically  . Kidney stones   . Kidney stone   . Kidney stone on left side 05/11/2013   Past Surgical History  Procedure Laterality Date  . Coronary artery bypass graft  03/16/2002    a) x3: LIMA-LAD, R rad-Cx branch, SVG-PDA    History   Social History  . Marital Status: Divorced    Spouse Name: N/A    Number of Children: N/A  . Years of Education: 12   Occupational History  . Not on file.   Social History Main Topics  . Smoking status: Current Every Day Smoker -- 0.50 packs/day for 30 years    Types: Cigarettes  . Smokeless tobacco: Never Used  . Alcohol Use: Yes     Comment:  rarely.  . Drug Use: No  . Sexually Active: Not on file   Other Topics Concern  . Not on file   Social History Narrative  . No narrative on file  He works for [employer]  Family History  Problem Relation Age of Onset  . Insulin resistance Mother 33  . Colon cancer Father 5  . Heart attack Father 7  . Stomach cancer Neg Hx   No Known Allergies  I have reviewed the patient's PSFH with him along with his medications.    Objective: Vital signs in last 24 hours: Temp:  [97.2 F (36.2 C)-98 F (36.7 C)] 98 F (36.7 C) (08/09 0418) Pulse Rate:  [80-87] 80 (08/09 0418) Resp:  [18] 18 (08/09 0418) BP: (144-170)/(73-87) 144/73 mmHg (08/09 0418) SpO2:  [99 %-100 %] 100 % (08/09 0418) Weight:  [97.7 kg (215 lb 6.2 oz)] 97.7 kg (215 lb 6.2 oz) (08/08 1709)  Intake/Output from previous day:   Intake/Output this shift: Total I/O In: -  Out: 1000 [Urine:1000]  General appearance: alert and no distress Head: Normocephalic,  without obvious abnormality, atraumatic Neck: no adenopathy, no carotid bruit, no JVD, supple, symmetrical, trachea midline and thyroid not enlarged, symmetric, no tenderness/mass/nodules Resp: clear to auscultation bilaterally Cardio: regular rate and rhythm, S1, S2 normal, no murmur, click, rub or gallop GI: Soft, mildly obese with LUQ tenderness without rebound or guarding.   No mass, HSM or hernias noted.  Extremities: extremities normal, atraumatic, no cyanosis or edema Skin: Skin color, texture, turgor normal. No rashes or lesions Lymph nodes: inguinal and femoral nodes normal.  Neurologic: Grossly normal  Lab Results:   Recent Labs  05/10/13 0157 05/12/13 0423  WBC  --  10.6*  HGB 14.6 12.7*  HCT 43.0 37.0*  PLT  --  213   BMET  Recent Labs  05/11/13 1141 05/12/13 0423  NA 136 136  K 3.6 4.0  CL 100 100  CO2 27 26  GLUCOSE 113* 138*  BUN 17 15  CREATININE 1.42* 1.47*  CALCIUM 9.6 8.9   PT/INR  Recent Labs   05/11/13 2037  LABPROT 13.2  INR 1.02   ABG No results found for this basename: PHART, PCO2, PO2, HCO3,  in the last 72 hours  Studies/Results: I have reviewed his CT films and report with results noted above.   I discussed his case with the medical team.   Anti-infectives: Anti-infectives   None      Current Facility-Administered Medications  Medication Dose Route Frequency Provider Last Rate Last Dose  . 0.9 %  sodium chloride infusion   Intravenous Continuous Na Li, MD      . docusate sodium (COLACE) capsule 100 mg  100 mg Oral BID Na Li, MD      . heparin injection 5,000 Units  5,000 Units Subcutaneous Q8H Dede Query, MD   5,000 Units at 05/12/13 1610  . insulin aspart (novoLOG) injection 0-9 Units  0-9 Units Subcutaneous Q4H Vivi Barrack, MD   1 Units at 05/12/13 0830  . insulin glargine (LANTUS) injection 10 Units  10 Units Subcutaneous QHS Na Li, MD      . magnesium hydroxide (MILK OF MAGNESIA) suspension 30 mL  30 mL Oral Daily PRN Na Li, MD      . morphine 2 MG/ML injection 2 mg  2 mg Intravenous Q4H PRN Na Li, MD   2 mg at 05/12/13 9604  . pantoprazole (PROTONIX) EC tablet 40 mg  40 mg Oral Q breakfast Na Li, MD   40 mg at 05/12/13 0830  . sodium chloride 0.9 % injection 3 mL  3 mL Intravenous Q12H Na Li, MD   3 mL at 05/11/13 2136  . tamsulosin (FLOMAX) capsule 0.4 mg  0.4 mg Oral Daily Na Li, MD   0.4 mg at 05/12/13 1031    Assessment: He has a symptomatic left proximal stone with a density of 1400HU on CT.   Plan: He is unlikely to pass this stone and with the high density, I think he is not a great candidate for ESWL.  I will get him set up for ureteroscopy extraction with laser and possible stenting.  I reviewed the risks of bleeding, infection, ureteral and GU tract injury with possible stricturing, need for a stent and secondary procedures, thrombotic events and anesthetic complications. He will be made NPO and I will stop the heparin.   This will be done either today  or tomorrow depending on the OR load at [facility] where he will need to be transferred for the procedure.  CC: Dr. Kem Kays.  LOS: 1 day    Jeremy Mclamb J 05/12/2013

## 2013-05-12 NOTE — Progress Notes (Addendum)
Report called to Amil Amen, Charity fundraiser at [facility]. [facility] is here to transport patient to 4W at [facility]. Patient is alert and oriented, belongings with him, including cell phone and eye glasses. Did not need pain med at this time. RN was notified of update on patient, and to page Dr. Annabell Howells when patient arrived.

## 2013-05-13 LAB — BASIC METABOLIC PANEL
CO2: 28 mEq/L (ref 19–32)
Calcium: 8.7 mg/dL (ref 8.4–10.5)
Chloride: 99 mEq/L (ref 96–112)
Potassium: 4.1 mEq/L (ref 3.5–5.1)
Sodium: 133 mEq/L — ABNORMAL LOW (ref 135–145)

## 2013-05-13 LAB — CBC
Hemoglobin: 12 g/dL — ABNORMAL LOW (ref 13.0–17.0)
Platelets: 214 10*3/uL (ref 150–400)
RBC: 4.01 MIL/uL — ABNORMAL LOW (ref 4.22–5.81)
WBC: 12.4 10*3/uL — ABNORMAL HIGH (ref 4.0–10.5)

## 2013-05-13 NOTE — Discharge Summary (Signed)
  Date: 05/13/2013  Patient name: Benjamin Baker  Medical record number: [id number]  Date of birth: 1956/08/20   This patient has been discussed with the house staff. Please see their note for complete details. I concur with their findings and plan. Jonah Blue, DO 05/13/2013, 3:31 PM

## 2013-05-13 NOTE — Discharge Summary (Signed)
Physician Discharge Summary  Patient ID: Benjamin Baker MRN: [id number] DOB/AGE: Mar 09, 1956 57 y.o.  Admit date: 05/11/2013 Discharge date: 05/13/2013  Admission Diagnoses: left ureteral stone  Discharge Diagnoses: left ureteral stone Principal Problem:   Ureteral stone Active Problems:   DIABETES MELLITUS, TYPE II   DYSLIPIDEMIA   TOBACCO ABUSE   HYPERTENSION   GERD   CORONARY ARTERY BYPASS GRAFT, THREE VESSEL, HX OF   Nephrolithiasis   Acute kidney injury   Discharged Condition: good  Hospital Course: Mr. Minks had left ureteroscopic stone extraction last night for a 6x49mm left proximal stone.   He required dilation and a stent was left.   He had pain post op and required admission but is doing better today and only hurts when he pees.   His urine is dark without clots.  He may have passed some small stone fragments.   He is ready for discharge.   He will need to be seen in my office in about 2 weeks for cystoscopy with stent removal.    Consults: None  Significant Diagnostic Studies: CT stone study  Treatments: surgery: left ureteroscopic stone extraction with holmium and stent insertion.   Discharge Exam: Blood pressure 148/67, pulse 63, temperature 97.7 F (36.5 C), temperature source Oral, resp. rate 18, height 5\' 9"  (1.753 m), weight 98.612 kg (217 lb 6.4 oz), SpO2 100.00%. General appearance: alert and no distress GI: soft, non-tender; bowel sounds normal; no masses,  no organomegaly  Disposition: 01-Home or Self Care     Medication List         hyoscyamine 0.125 MG SL tablet  Commonly known as:  LEVSIN/SL  Place 1 tablet (0.125 mg total) under the tongue every 4 (four) hours as needed for cramping.     insulin NPH-regular (70-30) 100 UNIT/ML injection  Commonly known as:  NOVOLIN 70/30  Inject 40-45 Units into the skin 2 (two) times daily with a meal. Use 45 units every morning and use 40 units every night     metFORMIN 1000 MG tablet  Commonly known as:   GLUCOPHAGE  Take 1,000 mg by mouth 2 (two) times daily with a meal.     metoCLOPramide 10 MG tablet  Commonly known as:  REGLAN  Take 10 mg by mouth every 6 (six) hours.     nitroGLYCERIN 0.4 MG SL tablet  Commonly known as:  NITROSTAT  Place 0.4 mg under the tongue every 5 (five) minutes as needed for chest pain. x3 doses as needed for chest pain     ondansetron 4 MG disintegrating tablet  Commonly known as:  ZOFRAN-ODT  Take 1 tablet (4 mg total) by mouth every 8 (eight) hours as needed for nausea. For nausea/vomiting     oxyCODONE-acetaminophen 5-325 MG per tablet  Commonly known as:  PERCOCET/ROXICET  Take 1 tablet by mouth every 4 (four) hours as needed for pain.     phenazopyridine 200 MG tablet  Commonly known as:  PYRIDIUM  Take 1 tablet (200 mg total) by mouth 3 (three) times daily as needed for pain (and burning).     tamsulosin 0.4 MG Caps capsule  Commonly known as:  FLOMAX  Take 0.4 mg by mouth daily.           Follow-up Information   Call Anner Crete, MD. (to be seen in 10-14 days for stent removal.  Tell the office you need to have your stent removed.   Bring stone fragments to office. )  Contact information:   [address] [place] [address] [phone]       Signed: Anner Crete 05/13/2013, 11:37 AM

## 2013-05-14 ENCOUNTER — Encounter (HOSPITAL_COMMUNITY): Payer: Self-pay | Admitting: Urology

## 2013-05-29 ENCOUNTER — Other Ambulatory Visit: Payer: Self-pay | Admitting: *Deleted

## 2013-05-29 MED ORDER — INSULIN NPH ISOPHANE & REGULAR (70-30) 100 UNIT/ML ~~LOC~~ SUSP: 40.0000 [IU] | Freq: Two times a day (BID) | SUBCUTANEOUS | Status: DC

## 2013-05-29 MED ORDER — METFORMIN HCL 1000 MG PO TABS: 1000.0000 mg | ORAL_TABLET | Freq: Two times a day (BID) | ORAL | Status: DC

## 2013-05-29 NOTE — Telephone Encounter (Signed)
Pt needs 3 vials a month, takes 80 units a day  Pt # [phone]

## 2013-05-29 NOTE — Telephone Encounter (Signed)
Rx called in 

## 2013-07-25 ENCOUNTER — Encounter: Payer: Self-pay | Admitting: *Deleted

## 2013-11-21 ENCOUNTER — Encounter: Payer: Self-pay | Admitting: Internal Medicine

## 2013-12-26 ENCOUNTER — Other Ambulatory Visit: Payer: Self-pay | Admitting: *Deleted

## 2013-12-27 MED ORDER — INSULIN NPH ISOPHANE & REGULAR (70-30) 100 UNIT/ML ~~LOC~~ SUSP: SUBCUTANEOUS | Status: DC

## 2013-12-27 NOTE — Telephone Encounter (Signed)
Called to pharm 

## 2013-12-27 NOTE — Telephone Encounter (Signed)
Phone in to [place] please

## 2014-01-03 ENCOUNTER — Other Ambulatory Visit: Payer: Self-pay | Admitting: *Deleted

## 2014-01-03 MED ORDER — METFORMIN HCL 1000 MG PO TABS: 1000.0000 mg | ORAL_TABLET | Freq: Two times a day (BID) | ORAL | Status: DC

## 2014-01-03 MED ORDER — INSULIN NPH ISOPHANE & REGULAR (70-30) 100 UNIT/ML ~~LOC~~ SUSP: SUBCUTANEOUS | Status: DC

## 2014-01-03 NOTE — Telephone Encounter (Signed)
Pt changed pharmacies so needs new Rx for insulin.

## 2014-01-09 ENCOUNTER — Encounter: Payer: No Typology Code available for payment source | Admitting: Internal Medicine

## 2014-02-20 ENCOUNTER — Emergency Department (HOSPITAL_COMMUNITY): Payer: No Typology Code available for payment source

## 2014-02-20 ENCOUNTER — Encounter (HOSPITAL_COMMUNITY): Payer: Self-pay | Admitting: Emergency Medicine

## 2014-02-20 ENCOUNTER — Emergency Department (HOSPITAL_COMMUNITY)
Admission: EM | Admit: 2014-02-20 | Discharge: 2014-02-20 | Disposition: A | Discharge status: Home / Self Care | Payer: No Typology Code available for payment source | Attending: Emergency Medicine | Admitting: Emergency Medicine

## 2014-02-20 DIAGNOSIS — Z79899 Other long term (current) drug therapy: Secondary | ICD-10-CM | POA: Insufficient documentation

## 2014-02-20 DIAGNOSIS — Z794 Long term (current) use of insulin: Secondary | ICD-10-CM | POA: Insufficient documentation

## 2014-02-20 DIAGNOSIS — E119 Type 2 diabetes mellitus without complications: Secondary | ICD-10-CM | POA: Insufficient documentation

## 2014-02-20 DIAGNOSIS — F17200 Nicotine dependence, unspecified, uncomplicated: Secondary | ICD-10-CM | POA: Insufficient documentation

## 2014-02-20 DIAGNOSIS — J189 Pneumonia, unspecified organism: Secondary | ICD-10-CM

## 2014-02-20 DIAGNOSIS — I252 Old myocardial infarction: Secondary | ICD-10-CM | POA: Insufficient documentation

## 2014-02-20 DIAGNOSIS — I10 Essential (primary) hypertension: Secondary | ICD-10-CM | POA: Insufficient documentation

## 2014-02-20 DIAGNOSIS — Z951 Presence of aortocoronary bypass graft: Secondary | ICD-10-CM | POA: Insufficient documentation

## 2014-02-20 DIAGNOSIS — I2510 Atherosclerotic heart disease of native coronary artery without angina pectoris: Secondary | ICD-10-CM | POA: Insufficient documentation

## 2014-02-20 DIAGNOSIS — J159 Unspecified bacterial pneumonia: Secondary | ICD-10-CM | POA: Insufficient documentation

## 2014-02-20 DIAGNOSIS — Z87442 Personal history of urinary calculi: Secondary | ICD-10-CM | POA: Insufficient documentation

## 2014-02-20 DIAGNOSIS — Z8719 Personal history of other diseases of the digestive system: Secondary | ICD-10-CM | POA: Insufficient documentation

## 2014-02-20 LAB — CBG MONITORING, ED: Glucose-Capillary: 188 mg/dL — ABNORMAL HIGH (ref 70–99)

## 2014-02-20 MED ORDER — HYDROCODONE-ACETAMINOPHEN 7.5-325 MG/15ML PO SOLN: 10.0000 mL | Freq: Four times a day (QID) | ORAL | Status: DC | PRN

## 2014-02-20 MED ORDER — FLUTICASONE PROPIONATE 50 MCG/ACT NA SUSP: 2.0000 | Freq: Every day | NASAL | Status: DC

## 2014-02-20 MED ORDER — AZITHROMYCIN 250 MG PO TABS: 250.0000 mg | ORAL_TABLET | Freq: Every day | ORAL | Status: DC

## 2014-02-20 NOTE — Discharge Planning (Signed)
[facility] Community Liaison: Spoke to patient about primary care resources and establishing care with a provider. Patient is a former orange Conservation officer, historic buildings at [facility], but has not been to the practice to renew his card or to see a provider in some time. Orange card enrollment appointment made for Tuesday May 26,2015 at 2:00, patient is aware of this appointment. Patient will be re-established as a patient with the practice once orange card enrollment is complete. Orange card application and my contact information was provided for any future questions or concerns. No other needs expressed at this time.

## 2014-02-20 NOTE — Discharge Instructions (Signed)
Pneumonia, Adult °Pneumonia is an infection of the lungs. It may be caused by a germ (virus or bacteria). Some types of pneumonia can spread easily from person to person. This can happen when you cough or sneeze. °HOME CARE °· Only take medicine as told by your doctor. °· Take your medicine (antibiotics) as told. Finish it even if you start to feel better. °· Do not smoke. °· You may use a vaporizer or humidifier in your room. This can help loosen thick spit (mucus). °· Sleep so you are almost sitting up (semi-upright). This helps reduce coughing. °· Rest. °A shot (vaccine) can help prevent pneumonia. Shots are often advised for: °· People over 65 years old. °· Patients on chemotherapy. °· People with long-term (chronic) lung problems. °· People with immune system problems. °GET HELP RIGHT AWAY IF:  °· You are getting worse. °· You cannot control your cough, and you are losing sleep. °· You cough up blood. °· Your pain gets worse, even with medicine. °· You have a fever. °· Any of your problems are getting worse, not better. °· You have shortness of breath or chest pain. °MAKE SURE YOU:  °· Understand these instructions. °· Will watch your condition. °· Will get help right away if you are not doing well or get worse. °Document Released: 03/08/2008 Document Revised: 12/13/2011 Document Reviewed: 12/11/2010 °ExitCare® Patient Information ©2014 [employer]. ° °

## 2014-02-20 NOTE — ED Provider Notes (Signed)
CSN: [id number]     Arrival date & time 02/20/14  1322 History   First MD Initiated Contact with Patient 02/20/14 1459    This chart was scribed for [name] PA-C, a non-physician practitioner working with Carmin Muskrat, MD by Denice Bors, ED Scribe. This patient was seen in room TR06C/TR06C and the patient's care was started at 3:19 PM      Chief Complaint  Patient presents with  . Otalgia     (Consider location/radiation/quality/duration/timing/severity/associated sxs/prior Treatment) The history is provided by the patient. No language interpreter was used.   HPI Comments: Benjamin Baker is a 58 y.o. male who presents to the Emergency Department complaining of constant left sided otalgia onset 10 days. Describes symptoms as gradually worsening in severity. Reports associated right sided headache, sinus pain, chronic cough, sneezing, watery eyes, itchy eyes, and nasal congestion. Denies trying any alleviating factors. Denies associated fever, sore throat, chest pain, and difficulty breathing. Reports taking Mucinex, and Claritin with mild relief of symptoms. Denies PMHx of seasonal allergies. States he smokes cigarettes. No recent hospitalization.   Past Medical History  Diagnosis Date  . Diabetes mellitus   . GERD (gastroesophageal reflux disease)   . CAD (coronary artery disease)     a. 03/2002 - CABG x 3: LIMA > LAD, R Rad > OM2, SVG > PDA.    b. Cath 06/2012 - Significant three-vessel disease, 3/3 grafts patent. LVEF 60-65%   . Hypertension   . Hyperlipidemia   . Tobacco abuse     a. ongoing 06/2012  . NSTEMI (non-ST elevated myocardial infarction) September 2013    grafts patent; EF normal; manage medically  . Kidney stones   . Kidney stone   . Kidney stone on left side 05/11/2013   Past Surgical History  Procedure Laterality Date  . Coronary artery bypass graft  03/16/2002    a) x3: LIMA-LAD, R rad-Cx branch, SVG-PDA   . Cystoscopy with ureteroscopy and stent  placement Left 05/12/2013    Procedure: CYSTOSCOPY WITH URETEROSCOPY AND STENT PLACEMENT;  Surgeon: Malka So, MD;  Location: [facility];  Service: Urology;  Laterality: Left;  . Holmium laser application Left 12/04/3555    Procedure: HOLMIUM LASER APPLICATION;  Surgeon: Malka So, MD;  Location: [facility] OR[facility];  Service: Urology;  Laterality: Left;   Family History  Problem Relation Age of Onset  . Insulin resistance Mother 63  . Colon cancer Father 30  . Heart attack Father 31  . Stomach cancer Neg Hx    History  Substance Use Topics  . Smoking status: Current Every Day Smoker -- 0.50 packs/day for 30 years    Types: Cigarettes  . Smokeless tobacco: Never Used  . Alcohol Use: Yes     Comment: rarely.    Review of Systems  Constitutional: Negative for fever.  HENT: Positive for ear pain.   Respiratory: Positive for cough.   Neurological: Positive for headaches.      Allergies  Review of patient's allergies indicates no known allergies.  Home Medications   Prior to Admission medications   Medication Sig Start Date End Date Taking? Authorizing Provider  insulin NPH-regular Human (NOVOLIN 70/30) (70-30) 100 UNIT/ML injection Inject 45 units into the skin every morning with breakfast and inject 40 units into the skin every evening with supper 01/03/14  Yes [name], MD  metFORMIN (GLUCOPHAGE) 1000 MG tablet Take 1 tablet (1,000 mg total) by mouth 2 (two) times daily with a meal. 01/03/14  Yes [name], MD   BP 139/84  Pulse 107  Temp(Src) 98.2 F (36.8 C) (Oral)  Resp 20  Ht 5\' 8"  (1.727 m)  Wt 225 lb (102.059 kg)  BMI 34.22 kg/m2  SpO2 94% Physical Exam  Nursing note and vitals reviewed. Constitutional: He is oriented to person, place, and time. He appears well-developed and well-nourished. No distress.  HENT:  Head: Normocephalic and atraumatic.  Right Ear: Tympanic membrane, external ear and ear canal normal.  Left Ear: Tympanic membrane, external ear and ear canal  normal.  Nose: Rhinorrhea present. Right sinus exhibits no maxillary sinus tenderness and no frontal sinus tenderness. Left sinus exhibits no maxillary sinus tenderness and no frontal sinus tenderness.  Mouth/Throat: Uvula is midline and oropharynx is clear and moist. No oropharyngeal exudate, posterior oropharyngeal edema or posterior oropharyngeal erythema.  Eyes: EOM are normal.  Neck: Neck supple. No tracheal deviation present.  Cardiovascular: Normal rate and regular rhythm.   Pulmonary/Chest: Effort normal and breath sounds normal. No respiratory distress. He has no wheezes. He has no rales.  Musculoskeletal: Normal range of motion.  Neurological: He is alert and oriented to person, place, and time.  Skin: Skin is warm and dry.  Psychiatric: He has a normal mood and affect. His behavior is normal.    ED Course  Procedures (including critical care time) COORDINATION OF CARE:  Nursing notes reviewed. Vital signs reviewed. Initial pt interview and examination performed.   Filed Vitals:   02/20/14 1325  BP: 139/84  Pulse: 107  Temp: 98.2 F (36.8 C)  TempSrc: Oral  Resp: 20  Height: 5\' 8"  (1.727 m)  Weight: 225 lb (102.059 kg)  SpO2: 94%    3:19 PM-Discussed work up plan with pt at bedside, which includes  Orders Placed This Encounter  Procedures  . DG Chest 2 View    Standing Status: Standing     Number of Occurrences: 1     Standing Expiration Date:     Order Specific Question:  Reason for exam:    Answer:  OTALGIA  . CBG monitoring, ED    Standing Status: Standing     Number of Occurrences: 1     Standing Expiration Date:   . Pt agrees with plan.   Treatment plan initiated:Medications - No data to display   Initial diagnostic testing ordered.      Labs Review Labs Reviewed  CBG MONITORING, ED - Abnormal; Notable for the following:    Glucose-Capillary 188 (*)    All other components within normal limits    Imaging Review No results found.   EKG  Interpretation None      MDM   Final diagnoses:  CAP (community acquired pneumonia)    BP 142/92  Pulse 91  Temp(Src) 97.8 F (36.6 C) (Oral)  Resp 24  Ht 5\' 8"  (1.727 m)  Wt 225 lb (102.059 kg)  BMI 34.22 kg/m2  SpO2 95%  I have reviewed nursing notes and vital signs. I personally reviewed the imaging tests through PACS system  I reviewed available ER/hospitalization records thought the EMR   I personally performed the services described in this documentation, which was scribed in my presence. The recorded information has been reviewed and is accurate.     [name], PA-C 02/20/14 1611  [name], PA-C 02/20/14 [id number]

## 2014-02-20 NOTE — ED Notes (Addendum)
Left ear pain that moves to rt h/a and cough x 1 month stuffed up nose

## 2014-02-23 NOTE — ED Provider Notes (Signed)
Medical screening examination/treatment/procedure(s) were performed by non-physician practitioner and as supervising physician I was immediately available for consultation/collaboration.   EKG Interpretation None        Seryna Marek, MD 02/23/14 0958 

## 2014-02-26 ENCOUNTER — Ambulatory Visit: Payer: No Typology Code available for payment source

## 2014-03-06 ENCOUNTER — Encounter: Payer: Self-pay | Admitting: Internal Medicine

## 2014-03-06 ENCOUNTER — Ambulatory Visit (INDEPENDENT_AMBULATORY_CARE_PROVIDER_SITE_OTHER): Payer: No Typology Code available for payment source | Admitting: Internal Medicine

## 2014-03-06 VITALS — BP 143/83 | HR 95 | Temp 97.0°F | Ht 70.0 in | Wt 210.1 lb

## 2014-03-06 DIAGNOSIS — J302 Other seasonal allergic rhinitis: Secondary | ICD-10-CM | POA: Insufficient documentation

## 2014-03-06 DIAGNOSIS — J309 Allergic rhinitis, unspecified: Secondary | ICD-10-CM

## 2014-03-06 DIAGNOSIS — E785 Hyperlipidemia, unspecified: Secondary | ICD-10-CM

## 2014-03-06 DIAGNOSIS — I10 Essential (primary) hypertension: Secondary | ICD-10-CM

## 2014-03-06 DIAGNOSIS — E119 Type 2 diabetes mellitus without complications: Secondary | ICD-10-CM

## 2014-03-06 LAB — LIPID PANEL
Cholesterol: 222 mg/dL — ABNORMAL HIGH (ref 0–200)
HDL: 32 mg/dL — ABNORMAL LOW (ref >39)
TRIGLYCERIDES: 444 mg/dL — AB (ref <150)
Total CHOL/HDL Ratio: 6.9 Ratio

## 2014-03-06 LAB — POCT GLYCOSYLATED HEMOGLOBIN (HGB A1C): HEMOGLOBIN A1C: 11

## 2014-03-06 LAB — GLUCOSE, CAPILLARY: Glucose-Capillary: 279 mg/dL — ABNORMAL HIGH (ref 70–99)

## 2014-03-06 MED ORDER — METFORMIN HCL 1000 MG PO TABS: 1000.0000 mg | ORAL_TABLET | Freq: Two times a day (BID) | ORAL | Status: DC

## 2014-03-06 MED ORDER — INSULIN NPH ISOPHANE & REGULAR (70-30) 100 UNIT/ML ~~LOC~~ SUSP: SUBCUTANEOUS | Status: DC

## 2014-03-06 MED ORDER — PRAVASTATIN SODIUM 40 MG PO TABS
40.0000 mg | ORAL_TABLET | Freq: Every evening | ORAL | Status: AC
Start: 2014-03-06 — End: 2015-03-06

## 2014-03-06 MED ORDER — CETIRIZINE HCL 10 MG PO TABS: 10.0000 mg | ORAL_TABLET | Freq: Every day | ORAL | Status: DC

## 2014-03-06 NOTE — Assessment & Plan Note (Signed)
Sinus pressure, rhinorrhea, nonproductive cough, itchy watery eyes, symptoms starting in May - this is consistent with seasonal allergies. All vital signs are stable. - Continue Flonase, 2 puffs every morning in each nostril - Prescribed Zyrtec daily (available on [place]) - Advised the patient to purchase Tylenol Severe Sinus over the counter to use as needed

## 2014-03-06 NOTE — Assessment & Plan Note (Addendum)
Patient was previously on pravastatin but this has fallen off his medication list due to noncompliance and lack of followup. 10-year ASCVD risk (using prior lipid panel from 2 years ago) is 27.9% - Re-order statin - Pravastatin 40mg  daily (on [place]) - Checking lipid panel

## 2014-03-06 NOTE — Progress Notes (Signed)
Attending physician note: Presenting problems, physical findings, and management plan discussed with resident physician Dr. [name] and I concur with her management plan.Benjamin Hopper, MD, [place]  Hematology-Oncology/Internal Medicine

## 2014-03-06 NOTE — Patient Instructions (Signed)
Thank you for your visit. - For your sinus pressure, runny nose, cough - please continue to take Flonase, 2 puffs in each nostril daily. I have prescribed an antiallergy medication called Zyrtec. Please take this every morning during allergy season. I also recommend Tylenol Severe Sinus, an over the counter medication, as needed for the cough, congestion, and itchy eyes. - For your diabetes, please find batteries for your meter. I would like you to check your blood sugars 4 times a day, before each meal and at bedtime. Please return to the clinic in 2 weeks and we will review your sugars and change your insulin based on this. I have also placed a referral for you to meet with Butch Penny, our diabetes educator, at that time if possible. - I will call you if any of your blood work today is abnormal. - I have refilled your insulin, metformin, and reordered your pravastatin. I gave you paper prescriptions to [facility] to [facility]. I only refilled your insulin for one month because I expect we will go up on the dose next visit. - You need to see an eye doctor for your blurry vision and diabetes. I placed a referral and one of the nurses will be in touch about her appointment. - Please return in 2 weeks with your meter for diabetes followup.

## 2014-03-06 NOTE — Assessment & Plan Note (Addendum)
BP Readings from Last 3 Encounters:  03/06/14 143/83  02/20/14 142/92  05/13/13 148/67    Lab Results  Component Value Date   NA 133* 05/13/2013   K 4.1 05/13/2013   CREATININE 1.22 05/13/2013    Assessment: Blood pressure control:  Controlled Progress toward BP goal:   At goal  Plan: Medications:  Controlled without any medications Other plans: Continue to monitor

## 2014-03-06 NOTE — Assessment & Plan Note (Addendum)
Lab Results  Component Value Date   HGBA1C 11.0 03/06/2014   HGBA1C 8.6 05/11/2013   HGBA1C 10.5 01/03/2013     Assessment: Diabetes control:  Poor Progress toward A1C goal:   Deteriorated Comments: Patient has been lost to followup and noncompliant with his diet, medications, and glucose monitoring. He brought his meter today, but it was out of batteries so we could not view or download content.  Plan: Medications:  continue current medications -  He should be taking Metformin 1000mg  BID, Novolin 70/30 45 units every morning with breakfast and 40 units every evening with supper - Refills provided. Home glucose monitoring: Frequency:   4 times per day Timing:   before meals and at bedtime Instruction/counseling given: reminded to get eye exam, reminded to bring blood glucose meter & log to each visit, reminded to bring medications to each visit and discussed diet Other plans: I have refilled the patient's insulin and metformin. He will likely need insulin titration, but it would be better to use information from his meter to guide this.  - I have instructed the patient to replace the batteries in his meter, and come back in 2 weeks with it for a diabetes focused visit - Have also placed a referral for him to see Butch Penny, hopefully at the same time.  - Checking urine microalbumin ratio - Referral to ophthalmology placed for an eye exam

## 2014-03-06 NOTE — Progress Notes (Signed)
Subjective:    Patient ID: Benjamin Baker, male    DOB: [birth year], 58 y.o.   MRN: [id number]  HPI  Benjamin Baker is a 58yo man with PMH of CAD s/p 3-vessel coronary bypass (03/2012) and NSTEMI (06/2012) with EF 55-60%, DM type 2, HTN, nephrolithiasis s/p left ureteroscopic stone extraction in 08/14, tobacco abuse, who presents for routine follow up. He has no-showed to several appointments this year. His last visit was 05/2013. He needs refills on all his medications.  DM2 - Last A1C was 8.6 in 08/14. History of noncompliance. He should be taking Metformin 1000mg  BID, Novolin 70/30 45 units every morning with breakfast and 40 units every evening with supper. He is behind on his health maintenance and needs a foot exam, eye exam, urine studies, A1C today. He tells me he tries to check his blood sugars twice a day, after breakfast and before supper, but admits he has been having battery issues with his meter lately. We cannot download any information from his meter because the batteries are dead. He self-reports his CBGs have been running in the 200-300s with no lows. He has met with [id number] before, but it's been a few years.  HTN - On no meds currently. BP 140s/80s here.  HLD - He is due for a lipid panel.  Financial issues - The patient met with Lupita Leash on 02/26/14 for orange card re-enrollment. He was approved.  Seasonal allergies - Patient reports allergy-type symptoms that started in early May. These include puffy, watery eyes, rhinorrhea, cough, subjective fever but home temperature checks have bee normal, left ear pain. He was seen in the emergency department on 02/20/2014 and diagnosed with community acquired pneumonia based on a chest x-ray. He completed a Z-Pak for this. He says this made him feel better, but he is still having some symptoms, particularly the sinus headache and cough. He has tried Claritin, but has not been helped. He is using Flonase nasal spray.   Current Outpatient  Prescriptions on File Prior to Visit  Medication Sig Dispense Refill  . azithromycin (ZITHROMAX) 250 MG tablet Take 1 tablet (250 mg total) by mouth daily.  4 tablet  0  . fluticasone (FLONASE) 50 MCG/ACT nasal spray Place 2 sprays into both nostrils daily.  16 g  0  . HYDROcodone-acetaminophen (HYCET) 7.5-325 mg/15 ml solution Take 10 mLs by mouth every 6 (six) hours as needed for moderate pain (cough).  120 mL  0  . insulin NPH-regular Human (NOVOLIN 70/30) (70-30) 100 UNIT/ML injection Inject 45 units into the skin every morning with breakfast and inject 40 units into the skin every evening with supper  30 mL  11  . metFORMIN (GLUCOPHAGE) 1000 MG tablet Take 1 tablet (1,000 mg total) by mouth 2 (two) times daily with a meal.  60 tablet  11    Review of Systems  Constitutional: Negative for fever, chills and unexpected weight change.  HENT: Positive for ear pain, postnasal drip, rhinorrhea and sinus pressure. Negative for ear discharge and sneezing.   Eyes: Positive for visual disturbance (Distance vision is blurry. He has glasses, but they are scratched up so he doesn't use them. Has been told in the past he has glaucoma by a Dr. [id number] (?). ).  Respiratory: Positive for cough (Nonproductive). Negative for shortness of breath.   Cardiovascular: Negative for chest pain.  Gastrointestinal: Negative for nausea, vomiting, abdominal pain and diarrhea.  Skin: Negative for rash.  Neurological: Positive for headaches (Sinus  pressure headache). Negative for dizziness, weakness and numbness.       Objective:   Physical Exam  Constitutional: He is oriented to person, place, and time. He appears well-developed and well-nourished.  HENT:  Head: Normocephalic and atraumatic.  Right Ear: Tympanic membrane and ear canal normal.  Left Ear: Tympanic membrane and ear canal normal.  Nose: No mucosal edema or rhinorrhea. Right sinus exhibits frontal sinus tenderness. Left sinus exhibits frontal sinus  tenderness.  Mouth/Throat: Oropharynx is clear and moist and mucous membranes are normal.  Eyes: Conjunctivae and EOM are normal. Pupils are equal, round, and reactive to light.  Neck: Normal range of motion. Neck supple.  Cardiovascular: Normal rate and regular rhythm.  Exam reveals no gallop and no friction rub.   No murmur heard. Pulmonary/Chest: Effort normal and breath sounds normal.  Abdominal: Soft. Bowel sounds are normal. He exhibits no distension. There is no tenderness.  Musculoskeletal: Normal range of motion. He exhibits no edema and no tenderness.  Neurological: He is alert and oriented to person, place, and time. No cranial nerve deficit.  Skin: Skin is warm and dry.  Psychiatric: He has a normal mood and affect.  Feet: DP pulses 1-2+ and symmetric. No skin breakdown. Sensation grossly intact.  Filed Vitals:   03/06/14 1351  BP: 143/83  Pulse: 95  Temp: 97 F (36.1 C)       Assessment & Plan:   Please see problem based charting.

## 2014-03-07 LAB — MICROALBUMIN / CREATININE URINE RATIO
CREATININE, URINE: 298.7 mg/dL
MICROALB UR: 7.81 mg/dL — AB (ref 0.00–1.89)
MICROALB/CREAT RATIO: 26.1 mg/g (ref 0.0–30.0)

## 2014-03-07 NOTE — Addendum Note (Signed)
Addended by: Jonathandavid Marlett, Wynelle Bourgeois on: 03/07/2014 12:53 PM   Modules accepted: Orders

## 2014-03-08 LAB — LDL CHOLESTEROL, DIRECT: Direct LDL: 111 mg/dL — ABNORMAL HIGH

## 2014-03-20 ENCOUNTER — Encounter: Payer: Self-pay | Admitting: Internal Medicine

## 2014-03-20 ENCOUNTER — Ambulatory Visit (INDEPENDENT_AMBULATORY_CARE_PROVIDER_SITE_OTHER): Payer: No Typology Code available for payment source | Admitting: Internal Medicine

## 2014-03-20 VITALS — BP 141/79 | HR 82 | Temp 96.9°F | Ht 70.0 in | Wt 218.0 lb

## 2014-03-20 DIAGNOSIS — E119 Type 2 diabetes mellitus without complications: Secondary | ICD-10-CM

## 2014-03-20 DIAGNOSIS — J309 Allergic rhinitis, unspecified: Secondary | ICD-10-CM

## 2014-03-20 DIAGNOSIS — I10 Essential (primary) hypertension: Secondary | ICD-10-CM

## 2014-03-20 DIAGNOSIS — F17200 Nicotine dependence, unspecified, uncomplicated: Secondary | ICD-10-CM

## 2014-03-20 DIAGNOSIS — Z0000 Encounter for general adult medical examination without abnormal findings: Secondary | ICD-10-CM

## 2014-03-20 DIAGNOSIS — H6092 Unspecified otitis externa, left ear: Secondary | ICD-10-CM

## 2014-03-20 DIAGNOSIS — H60399 Other infective otitis externa, unspecified ear: Secondary | ICD-10-CM

## 2014-03-20 DIAGNOSIS — J302 Other seasonal allergic rhinitis: Secondary | ICD-10-CM

## 2014-03-20 DIAGNOSIS — Z23 Encounter for immunization: Secondary | ICD-10-CM

## 2014-03-20 LAB — HM DIABETES EYE EXAM

## 2014-03-20 LAB — GLUCOSE, CAPILLARY: GLUCOSE-CAPILLARY: 141 mg/dL — AB (ref 70–99)

## 2014-03-20 MED ORDER — HYDROCORTISONE-ACETIC ACID 1-2 % OT SOLN: 3.0000 [drp] | Freq: Four times a day (QID) | OTIC | Status: AC | PRN

## 2014-03-20 MED ORDER — INSULIN NPH ISOPHANE & REGULAR (70-30) 100 UNIT/ML ~~LOC~~ SUSP: SUBCUTANEOUS | Status: DC

## 2014-03-20 MED ORDER — CETIRIZINE HCL 10 MG PO TABS: 10.0000 mg | ORAL_TABLET | Freq: Every day | ORAL | Status: DC

## 2014-03-20 NOTE — Progress Notes (Signed)
Attending Physician Note: Presenting problems, physical findings, medications, reviewed with resident physician Dr. Melburn Hake and I concur with her management plan. Murriel Hopper, MD, [place]  [facility]ematology-Oncology/Internal Medicine

## 2014-03-20 NOTE — Patient Instructions (Signed)
General Instructions: Thank you for bringing in your meter today.  Please increase your insulin to 45 units in AM and PM and keep checking your blood sugar three times a day before meals daily  Return to [facility]c next week to meet with diabetes educator Butch Penny Plyler to make sure the insulin dose is working  Please work on your diet and cut back on sugars and starches  Please stop smoking, call 1800quitnow for assistance  Please get your medications filled at the pharmacy they have all been called in. Try zyrtec daily for allergy relief  For your left ear infection:  Put 3-5 drops up to every 6 hours in left ear by tilting the head toward the opposite shoulder, pulling the superior aspect of the auricle upward, and filling the ear canal with drops. Patients should lie on their side for 3 to 5 minutes or place a cotton ball in the ear canal for 20 minutes to maximize medicine exposure. Try this for 7 days if no improvement call and let us know.   Please bring your medicines with you each time you come to clinic.  Medicines may include prescription medications, over-the-counter medications, herbal remedies, eye drops, vitamins, or other pills.  Progress Toward Treatment Goals:  Treatment Goal 03/20/2014  Hemoglobin A1C unable to assess  Blood pressure at goal  Stop smoking smoking the same amount    Self Care Goals & Plans:  Self Care Goal 01/03/2013  Manage my medications take my medicines as prescribed; refill my medications on time; bring my medications to every visit  Monitor my health keep track of my blood glucose; bring my glucose meter and log to each visit  Eat healthy foods eat foods that are low in salt  Be physically active take a walk every day  Stop smoking go to the [place] website (https://[place].info/); call [facility] (1-800-QUIT-NOW)    Home Blood Glucose Monitoring 03/20/2014  Check my blood sugar 3 times a day  When to check my blood sugar before meals     Care  Management & Community Referrals:  Referral 09/11/2012  Referrals made for care management support none needed

## 2014-03-20 NOTE — Progress Notes (Signed)
Subjective:   Patient ID: Benjamin Baker male   DOB: [birth year] 58 y.o.   MRN: [id number]  HPI: Mr.Benjamin Baker is a 58 y.o. male with CAD s/p CABG, HTN and uncontrolled DM2 presenting to [facility]c today for diabetes follow up. He did bring his meter today, but difficult to download all values due to incorrect dates; however, when reviewed with CDE, most CBGs >300, 147 this morning.  He denies any hypoglycemic episodes and reports compliance with insulin. He just recently started using his meter again as he was delayed in getting his strips and battery fixed for the meter. He claims to have lost all the prescriptions that were given to him on last visit, which were then called into the pharmacy today. He will go for eye exam today in the office, but will need to schedule at a later date to meet with CDE. He admits to not following a strict diabetic diet and believes he needs more insulin.   In regards to his congestion: he reports continued sinus congestion and recent L ear pain and discomfort.  He has not started the zyrtec prescribed on last visit but says he has tried OTC mucinex with no relief.   Past Medical History  Diagnosis Date  . Diabetes mellitus   . GERD (gastroesophageal reflux disease)   . CAD (coronary artery disease)     a. 03/2002 - CABG x 3: LIMA > LAD, R Rad > OM2, SVG > PDA.    b. Cath 06/2012 - Significant three-vessel disease, 3/3 grafts patent. LVEF 60-65%   . Hypertension   . Hyperlipidemia   . Tobacco abuse     a. ongoing 06/2012  . NSTEMI (non-ST elevated myocardial infarction) September 2013    grafts patent; EF normal; manage medically  . Kidney stones   . Kidney stone   . Kidney stone on left side 05/11/2013   Current Outpatient Prescriptions  Medication Sig Dispense Refill  . acetic acid-hydrocortisone (VOSOL-HC) otic solution Place 3-5 drops into the left ear every 6 (six) hours as needed.  10 mL  0  . cetirizine (ZYRTEC) 10 MG tablet Take 1 tablet (10 mg total)  by mouth daily.  30 tablet  1  . fluticasone (FLONASE) 50 MCG/ACT nasal spray Place 2 sprays into both nostrils daily.  16 g  0  . insulin NPH-regular Human (NOVOLIN 70/30) (70-30) 100 UNIT/ML injection Inject 45 units into the skin every morning with breakfast and inject 45 units into the skin every evening with supper  30 mL  1  . metFORMIN (GLUCOPHAGE) 1000 MG tablet Take 1 tablet (1,000 mg total) by mouth 2 (two) times daily with a meal.  60 tablet  11  . pravastatin (PRAVACHOL) 40 MG tablet Take 1 tablet (40 mg total) by mouth every evening.  30 tablet  11   No current facility-administered medications for this visit.   Family History  Problem Relation Age of Onset  . Insulin resistance Mother 42  . Colon cancer Father 108  . Heart attack Father 63  . Stomach cancer Neg Hx    History   Social History  . Marital Status: Divorced    Spouse Name: N/A    Number of Children: N/A  . Years of Education: 81   Social History Main Topics  . Smoking status: Current Every Day Smoker -- 0.50 packs/day for 30 years    Types: Cigarettes  . Smokeless tobacco: Never Used  . Alcohol Use: Yes  Comment: rarely.  . Drug Use: No  . Sexual Activity: None   Other Topics Concern  . None   Social History Narrative  . None   Review of Systems:  Constitutional:  Denies fever, chills  HEENT:  Congestion and L ear pain  Respiratory:  Denies SOB  Cardiovascular:  Denies chest pain  Gastrointestinal:  Denies nausea, vomiting, abdominal pain  Genitourinary:  Denies difficulty urinating.   Musculoskeletal:  Denies gait problem.   Skin:  Denies pallor, rash and wound.   Neurological:  Denies syncope   Objective:  Physical Exam: Filed Vitals:   03/20/14 1401  BP: 141/79  Pulse: 82  Temp: 96.9 F (36.1 C)  TempSrc: Oral  Height: 5\' 10"  (1.778 m)  Weight: 218 lb (98.884 kg)  SpO2: 97%   Vitals reviewed. General: sitting in chair, NAD HEENT: EOMI, No visible erythema or drainage right  ear, left ear pain on examination, erythematous canal, no visible pus or drainage, no pain on ear movement, denies tenderness to palpation of maxillary and frontal sinus region Cardiac: RRR, no rubs, murmurs or gallops Pulm: clear to auscultation bilaterally Abd: soft, nondistended, BS present Ext: warm and well perfused, moving all extremities Neuro: alert and oriented X3  Assessment & Plan:  Discussed with Dr. Beryle Beams DM2--70/30 increased to 45 units AM and PM, needs CLOSE follow up with CDE in one week for likely further adjustment  Left external otitis--Acetic acid hydrocortisone drops Called in all medications to [facility] dept pharmacy

## 2014-03-21 DIAGNOSIS — H6092 Unspecified otitis externa, left ear: Secondary | ICD-10-CM | POA: Insufficient documentation

## 2014-03-21 NOTE — Assessment & Plan Note (Signed)
Lab Results  Component Value Date   HGBA1C 11.0 03/06/2014   HGBA1C 8.6 05/11/2013   HGBA1C 10.5 01/03/2013    Assessment: Diabetes control: poor control (HgbA1C >9%) Progress toward A1C goal:  unable to assess Comments: did bring in his meter, cbs> 300 except for this morning 147. Limited readings as he just recently got meter fixed and strips. Admits to uncontrolled diabetic diet. Needs to meet with CDE. Eye exam today.   Plan: Medications:  increased Novolin 70/30 to 45 units in AM and PM today but will need long visit with CDE and likely further adjustments made based on diet and education given that his insulin requirement seems very high based on weight. continue metformin Home glucose monitoring: Frequency: 3 times a day Timing: before meals Instruction/counseling given: reminded to get eye exam, reminded to bring blood glucose meter & log to each visit, reminded to bring medications to each visit and discussed diet Educational resources provided:   Self management tools provided:   Other plans: CDE visit next week. Educated on hypoglycemia and diet

## 2014-03-21 NOTE — Assessment & Plan Note (Signed)
Has not started zyrtec prescribed on last visit.   -called in the prescription from [facility] to [facility] pharmacy since pt claims he lost the prescription -continue flonase

## 2014-03-21 NOTE — Assessment & Plan Note (Addendum)
BP Readings from Last 3 Encounters:  03/20/14 141/79  03/06/14 143/83  02/20/14 142/92   Lab Results  Component Value Date   NA 133* 05/13/2013   K 4.1 05/13/2013   CREATININE 1.22 05/13/2013   Assessment: Blood pressure control: controlled, mildly elevated Progress toward BP goal:  at goal Plan: Medications:  Not on any BP medication at this time, may consider starting low dose in the future if persistent elevated

## 2014-03-21 NOTE — Assessment & Plan Note (Signed)
  Assessment: Progress toward smoking cessation:  smoking the same amount (1 pack q 2 days) Barriers to progress toward smoking cessation:    Comments: not ready to quit at this time but considering cutting back  Plan: Instruction/counseling given:  I counseled patient on the dangers of tobacco use, advised patient to stop smoking, and reviewed strategies to maximize success. Educational resources provided:    Self management tools provided:    Medications to assist with smoking cessation:  None Patient agreed to the following self-care plans for smoking cessation:    Other plans: continue to encourage cessation

## 2014-03-21 NOTE — Addendum Note (Signed)
Addended by: Hulan Fray on: 03/21/2014 05:17 PM   Modules accepted: Orders

## 2014-03-21 NOTE — Assessment & Plan Note (Signed)
Pneumococcal vaccine given today. 

## 2014-03-21 NOTE — Assessment & Plan Note (Signed)
Pain and discomfort x1 week.   -Vosolo otic solution with drops to left ear q6h prn for at least 1 week, prescription sent to pharmacy

## 2014-03-27 ENCOUNTER — Ambulatory Visit: Payer: No Typology Code available for payment source | Admitting: Dietician

## 2014-04-08 NOTE — Addendum Note (Signed)
Addended by: Hulan Fray on: 04/08/2014 02:39 PM   Modules accepted: Orders

## 2014-04-25 NOTE — Addendum Note (Signed)
Addended by: Truddie Crumble on: 04/25/2014 04:47 PM   Modules accepted: Orders

## 2014-04-29 ENCOUNTER — Encounter: Payer: Self-pay | Admitting: Dietician

## 2014-05-05 ENCOUNTER — Encounter: Payer: Self-pay | Admitting: Internal Medicine

## 2014-05-05 DIAGNOSIS — E113292 Type 2 diabetes mellitus with mild nonproliferative diabetic retinopathy without macular edema, left eye: Secondary | ICD-10-CM | POA: Insufficient documentation

## 2014-05-25 ENCOUNTER — Encounter (HOSPITAL_COMMUNITY): Payer: Self-pay | Admitting: Emergency Medicine

## 2014-05-25 ENCOUNTER — Emergency Department (HOSPITAL_COMMUNITY)
Admission: EM | Admit: 2014-05-25 | Discharge: 2014-05-25 | Disposition: A | Discharge status: Home / Self Care | Payer: Worker's Compensation | Attending: Emergency Medicine | Admitting: Emergency Medicine

## 2014-05-25 DIAGNOSIS — Z8719 Personal history of other diseases of the digestive system: Secondary | ICD-10-CM | POA: Insufficient documentation

## 2014-05-25 DIAGNOSIS — S8990XA Unspecified injury of unspecified lower leg, initial encounter: Secondary | ICD-10-CM | POA: Insufficient documentation

## 2014-05-25 DIAGNOSIS — I252 Old myocardial infarction: Secondary | ICD-10-CM | POA: Insufficient documentation

## 2014-05-25 DIAGNOSIS — F17200 Nicotine dependence, unspecified, uncomplicated: Secondary | ICD-10-CM | POA: Insufficient documentation

## 2014-05-25 DIAGNOSIS — I2510 Atherosclerotic heart disease of native coronary artery without angina pectoris: Secondary | ICD-10-CM | POA: Insufficient documentation

## 2014-05-25 DIAGNOSIS — S99919A Unspecified injury of unspecified ankle, initial encounter: Secondary | ICD-10-CM

## 2014-05-25 DIAGNOSIS — E119 Type 2 diabetes mellitus without complications: Secondary | ICD-10-CM | POA: Diagnosis not present

## 2014-05-25 DIAGNOSIS — E785 Hyperlipidemia, unspecified: Secondary | ICD-10-CM | POA: Diagnosis not present

## 2014-05-25 DIAGNOSIS — I10 Essential (primary) hypertension: Secondary | ICD-10-CM | POA: Diagnosis not present

## 2014-05-25 DIAGNOSIS — W010XXA Fall on same level from slipping, tripping and stumbling without subsequent striking against object, initial encounter: Secondary | ICD-10-CM | POA: Insufficient documentation

## 2014-05-25 DIAGNOSIS — Y9301 Activity, walking, marching and hiking: Secondary | ICD-10-CM | POA: Insufficient documentation

## 2014-05-25 DIAGNOSIS — Z87442 Personal history of urinary calculi: Secondary | ICD-10-CM | POA: Diagnosis not present

## 2014-05-25 DIAGNOSIS — S93401A Sprain of unspecified ligament of right ankle, initial encounter: Secondary | ICD-10-CM

## 2014-05-25 DIAGNOSIS — I82401 Acute embolism and thrombosis of unspecified deep veins of right lower extremity: Secondary | ICD-10-CM

## 2014-05-25 DIAGNOSIS — IMO0002 Reserved for concepts with insufficient information to code with codable children: Secondary | ICD-10-CM | POA: Insufficient documentation

## 2014-05-25 DIAGNOSIS — Z79899 Other long term (current) drug therapy: Secondary | ICD-10-CM | POA: Diagnosis not present

## 2014-05-25 DIAGNOSIS — Y9289 Other specified places as the place of occurrence of the external cause: Secondary | ICD-10-CM | POA: Insufficient documentation

## 2014-05-25 DIAGNOSIS — Z951 Presence of aortocoronary bypass graft: Secondary | ICD-10-CM | POA: Diagnosis not present

## 2014-05-25 DIAGNOSIS — I82409 Acute embolism and thrombosis of unspecified deep veins of unspecified lower extremity: Secondary | ICD-10-CM | POA: Insufficient documentation

## 2014-05-25 DIAGNOSIS — S99929A Unspecified injury of unspecified foot, initial encounter: Secondary | ICD-10-CM

## 2014-05-25 DIAGNOSIS — M79609 Pain in unspecified limb: Secondary | ICD-10-CM

## 2014-05-25 DIAGNOSIS — S93409A Sprain of unspecified ligament of unspecified ankle, initial encounter: Secondary | ICD-10-CM | POA: Insufficient documentation

## 2014-05-25 MED ORDER — ACETAMINOPHEN 500 MG PO TABS
1000.0000 mg | ORAL_TABLET | Freq: Once | ORAL | Status: AC
  Administered 2014-05-25: 1000 mg via ORAL
  Filled 2014-05-25: qty 2

## 2014-05-25 MED ORDER — ENOXAPARIN SODIUM 100 MG/ML ~~LOC~~ SOLN
1.0000 mg/kg | Freq: Once | SUBCUTANEOUS | Status: AC
  Administered 2014-05-25: 100 mg via SUBCUTANEOUS
  Filled 2014-05-25: qty 1

## 2014-05-25 MED ORDER — XARELTO VTE STARTER PACK 15 & 20 MG PO TBPK: 15.0000 mg | ORAL_TABLET | ORAL | Status: DC

## 2014-05-25 MED ORDER — HYDROCODONE-ACETAMINOPHEN 5-325 MG PO TABS: 2.0000 | ORAL_TABLET | ORAL | Status: DC | PRN

## 2014-05-25 NOTE — ED Notes (Signed)
Pt sts fall last night working the [employer], tripped and fell. Complains of right leg pain.

## 2014-05-25 NOTE — ED Notes (Signed)
Declined W/C at D/C and was escorted to lobby by RN. 

## 2014-05-25 NOTE — Discharge Instructions (Signed)
Ankle Sprain °An ankle sprain is an injury to the strong, fibrous tissues (ligaments) that hold the bones of your ankle joint together.  °CAUSES °An ankle sprain is usually caused by a fall or by twisting your ankle. Ankle sprains most commonly occur when you step on the outer edge of your foot, and your ankle turns inward. People who participate in sports are more prone to these types of injuries.  °SYMPTOMS  °· Pain in your ankle. The pain may be present at rest or only when you are trying to stand or walk. °· Swelling. °· Bruising. Bruising may develop immediately or within 1 to 2 days after your injury. °· Difficulty standing or walking, particularly when turning corners or changing directions. °DIAGNOSIS  °Your caregiver will ask you details about your injury and perform a physical exam of your ankle to determine if you have an ankle sprain. During the physical exam, your caregiver will press on and apply pressure to specific areas of your foot and ankle. Your caregiver will try to move your ankle in certain ways. An X-ray exam may be done to be sure a bone was not broken or a ligament did not separate from one of the bones in your ankle (avulsion fracture).  °TREATMENT  °Certain types of braces can help stabilize your ankle. Your caregiver can make a recommendation for this. Your caregiver may recommend the use of medicine for pain. If your sprain is severe, your caregiver may refer you to a surgeon who helps to restore function to parts of your skeletal system (orthopedist) or a physical therapist. °HOME CARE INSTRUCTIONS  °· Apply ice to your injury for 1-2 days or as directed by your caregiver. Applying ice helps to reduce inflammation and pain. °¨ Put ice in a plastic bag. °¨ Place a towel between your skin and the bag. °¨ Leave the ice on for 15-20 minutes at a time, every 2 hours while you are awake. °· Only take over-the-counter or prescription medicines for pain, discomfort, or fever as directed by  your caregiver. °· Elevate your injured ankle above the level of your heart as much as possible for 2-3 days. °· If your caregiver recommends crutches, use them as instructed. Gradually put weight on the affected ankle. Continue to use crutches or a cane until you can walk without feeling pain in your ankle. °· If you have a plaster splint, wear the splint as directed by your caregiver. Do not rest it on anything harder than a pillow for the first 24 hours. Do not put weight on it. Do not get it wet. You may take it off to take a shower or bath. °· You may have been given an elastic bandage to wear around your ankle to provide support. If the elastic bandage is too tight (you have numbness or tingling in your foot or your foot becomes cold and blue), adjust the bandage to make it comfortable. °· If you have an air splint, you may blow more air into it or let air out to make it more comfortable. You may take your splint off at night and before taking a shower or bath. Wiggle your toes in the splint several times per day to decrease swelling. °SEEK MEDICAL CARE IF:  °· You have rapidly increasing bruising or swelling. °· Your toes feel extremely cold or you lose feeling in your foot. °· Your pain is not relieved with medicine. °SEEK IMMEDIATE MEDICAL CARE IF: °· Your toes are numb or blue. °·   You have severe pain that is increasing. MAKE SURE YOU:   Understand these instructions.  Will watch your condition.  Will get help right away if you are not doing well or get worse. Document Released: 09/20/2005 Document Revised: 06/14/2012 Document Reviewed: 10/02/2011 [facility] Patient Information 2015 [place], [place]. This information is not intended to replace advice given to you by your health care provider. Make sure you discuss any questions you have with your health care provider.  Deep Vein Thrombosis A deep vein thrombosis (DVT) is a blood clot that develops in the deep, larger veins of the leg, arm, or  pelvis. These are more dangerous than clots that might form in veins near the surface of the body. A DVT can lead to serious and even life-threatening complications if the clot breaks off and travels in the bloodstream to the lungs.  A DVT can damage the valves in your leg veins so that instead of flowing upward, the blood pools in the lower leg. This is called post-thrombotic syndrome, and it can result in pain, swelling, discoloration, and sores on the leg. CAUSES Usually, several things contribute to the formation of blood clots. Contributing factors include:  The flow of blood slows down.  The inside of the vein is damaged in some way.  You have a condition that makes blood clot more easily. RISK FACTORS Some people are more likely than others to develop blood clots. Risk factors include:   Smoking.  Being overweight (obese).  Sitting or lying still for a long time. This includes long-distance travel, paralysis, or recovery from an illness or surgery. Other factors that increase risk are:   Older age, especially over 62 years of age.  Having a family history of blood clots or if you have already had a blot clot.  Having major or lengthy surgery. This is especially true for surgery on the hip, knee, or belly (abdomen). Hip surgery is particularly high risk.  Having a long, thin tube (catheter) placed inside a vein during a medical procedure.  Breaking a hip or leg.  Having cancer or cancer treatment.  Pregnancy and childbirth.  Hormone changes make the blood clot more easily during pregnancy.  The fetus puts pressure on the veins of the pelvis.  There is a risk of injury to veins during delivery or a caesarean delivery. The risk is highest just after childbirth.  Medicines containing the male hormone estrogen. This includes birth control pills and hormone replacement therapy.  Other circulation or heart problems.  SIGNS AND SYMPTOMS When a clot forms, it can either  partially or totally block the blood flow in that vein. Symptoms of a DVT can include:  Swelling of the leg or arm, especially if one side is much worse.  Warmth and redness of the leg or arm, especially if one side is much worse.  Pain in an arm or leg. If the clot is in the leg, symptoms may be more noticeable or worse when standing or walking. The symptoms of a DVT that has traveled to the lungs (pulmonary embolism, PE) usually start suddenly and include:  Shortness of breath.  Coughing.  Coughing up blood or blood-tinged mucus.  Chest pain. The chest pain is often worse with deep breaths.  Rapid heartbeat. Anyone with these symptoms should get emergency medical treatment right away. Do not wait to see if the symptoms will go away. Call your local emergency services (911 in the [place] if you have these symptoms. Do not drive yourself  to the hospital. DIAGNOSIS If a DVT is suspected, your health care provider will take a full medical history and perform a physical exam. Tests that also may be required include:  Blood tests, including studies of the clotting properties of the blood.  Ultrasound to see if you have clots in your legs or lungs.  X-rays to show the flow of blood when dye is injected into the veins (venogram).  Studies of your lungs if you have any chest symptoms. PREVENTION  Exercise the legs regularly. Take a brisk 30-minute walk every day.  Maintain a weight that is appropriate for your height.  Avoid sitting or lying in bed for long periods of time without moving your legs.  Women, particularly those over the age of 50 years, should consider the risks and benefits of taking estrogen medicines, including birth control pills.  Do not smoke, especially if you take estrogen medicines.  Long-distance travel can increase your risk of DVT. You should exercise your legs by walking or pumping the muscles every hour.  Many of the risk factors above relate to  situations that exist with hospitalization, either for illness, injury, or elective surgery. Prevention may include medical and nonmedical measures.  Your health care provider will assess you for the need for venous thromboembolism prevention when you are admitted to the hospital. If you are having surgery, your surgeon will assess you the day of or day after surgery. TREATMENT Once identified, a DVT can be treated. It can also be prevented in some circumstances. Once you have had a DVT, you may be at increased risk for a DVT in the future. The most common treatment for DVT is blood-thinning (anticoagulant) medicine, which reduces the blood's tendency to clot. Anticoagulants can stop new blood clots from forming and stop old clots from growing. They cannot dissolve existing clots. Your body does this by itself over time. Anticoagulants can be given by mouth, through an IV tube, or by injection. Your health care provider will determine the best program for you. Other medicines or treatments that may be used are:  Heparin or related medicines (low molecular weight heparin) are often the first treatment for a blood clot. They act quickly. However, they cannot be taken orally and must be given either in shot form or by IV tube.  Heparin can cause a fall in a component of blood that stops bleeding and forms blood clots (platelets). You will be monitored with blood tests to be sure this does not occur.  Warfarin is an anticoagulant that can be swallowed. It takes a few days to start working, so usually heparin or related medicines are used in combination. Once warfarin is working, heparin is usually stopped.  Factor Xa inhibitor medicines, such as rivaroxaban and apixaban, also reduce blood clotting. These medicines are taken orally and can often be used without heparin or related medicines.  Less commonly, clot dissolving drugs (thrombolytics) are used to dissolve a DVT. They carry a high risk of bleeding,  so they are used mainly in severe cases where your life or a part of your body is threatened.  Very rarely, a blood clot in the leg needs to be removed surgically.  If you are unable to take anticoagulants, your health care provider may arrange for you to have a filter placed in a main vein in your abdomen. This filter prevents clots from traveling to your lungs. HOME CARE INSTRUCTIONS  Take all medicines as directed by your health care provider.  Learn  as much as you can about DVT.  Wear a medical alert bracelet or carry a medical alert card.  Ask your health care provider how soon you can go back to normal activities. It is important to stay active to prevent blood clots. If you are on anticoagulant medicine, avoid contact sports.  It is very important to exercise. This is especially important while traveling, sitting, or standing for long periods of time. Exercise your legs by walking or by tightening and relaxing your leg muscles regularly. Take frequent walks.  You may need to wear compression stockings. These are tight elastic stockings that apply pressure to the lower legs. This pressure can help keep the blood in the legs from clotting. Taking Warfarin Warfarin is a daily medicine that is taken by mouth. Your health care provider will advise you on the length of treatment (usually 3-6 months, sometimes lifelong). If you take warfarin:  Understand how to take warfarin and foods that can affect how warfarin works in Veterinary surgeon.  Too much and too little warfarin are both dangerous. Too much warfarin increases the risk of bleeding. Too little warfarin continues to allow the risk for blood clots. Warfarin and Regular Blood Testing While taking warfarin, you will need to have regular blood tests to measure your blood clotting time. These blood tests usually include both the prothrombin time (PT) and international normalized ratio (INR) tests. The PT and INR results allow your health care  provider to adjust your dose of warfarin. It is very important that you have your PT and INR tested as often as directed by your health care provider.  Warfarin and Your Diet Avoid major changes in your diet, or notify your health care provider before changing your diet. Arrange a visit with a registered dietitian to answer your questions. Many foods, especially foods high in vitamin K, can interfere with warfarin and affect the PT and INR results. You should eat a consistent amount of foods high in vitamin K. Foods high in vitamin K include:   Spinach, kale, broccoli, cabbage, collard and turnip greens, Brussels sprouts, peas, cauliflower, seaweed, and parsley.  Beef and pork liver.  Green tea.  Soybean oil. Warfarin with Other Medicines Many medicines can interfere with warfarin and affect the PT and INR results. You must:  Tell your health care provider about any and all medicines, vitamins, and supplements you take, including aspirin and other over-the-counter anti-inflammatory medicines. Be especially cautious with aspirin and anti-inflammatory medicines. Ask your health care provider before taking these.  Do not take or discontinue any prescribed or over-the-counter medicine except on the advice of your health care provider or pharmacist. Warfarin Side Effects Warfarin can have side effects, such as easy bruising and difficulty stopping bleeding. Ask your health care provider or pharmacist about other side effects of warfarin. You will need to:  Hold pressure over cuts for longer than usual.  Notify your dentist and other health care providers that you are taking warfarin before you undergo any procedures where bleeding may occur. Warfarin with Alcohol and Tobacco   Drinking alcohol frequently can increase the effect of warfarin, leading to excess bleeding. It is best to avoid alcoholic drinks or to consume only very small amounts while taking warfarin. Notify your health care  provider if you change your alcohol intake.   Do not use any tobacco products including cigarettes, chewing tobacco, or electronic cigarettes. If you smoke, quit. Ask your health care provider for help with quitting smoking. Alternative Medicines  to Warfarin: Factor Xa Inhibitor Medicines  These blood-thinning medicines are taken by mouth, usually for several weeks or longer. It is important to take the medicine every single day at the same time each day.  There are no regular blood tests required when using these medicines.  There are fewer food and drug interactions than with warfarin.  The side effects of this class of medicine are similar to those of warfarin, including excessive bruising or bleeding. Ask your health care provider or pharmacist about other potential side effects. SEEK MEDICAL CARE IF:  You notice a rapid heartbeat.  You feel weaker or more tired than usual.  You feel faint.  You notice increased bruising.  You feel your symptoms are not getting better in the time expected.  You believe you are having side effects of medicine. SEEK IMMEDIATE MEDICAL CARE IF:  You have chest pain.  You have trouble breathing.  You have new or increased swelling or pain in one leg.  You cough up blood.  You notice blood in vomit, in a bowel movement, or in urine. MAKE SURE YOU:  Understand these instructions.  Will watch your condition.  Will get help right away if you are not doing well or get worse. Document Released: 09/20/2005 Document Revised: 02/04/2014 Document Reviewed: 05/28/2013 [facility] Patient Information 2015 [place], [place]. This information is not intended to replace advice given to you by your health care provider. Make sure you discuss any questions you have with your health care provider.

## 2014-05-25 NOTE — ED Notes (Signed)
Back from ultrasound

## 2014-05-25 NOTE — Progress Notes (Signed)
VASCULAR LAB PRELIMINARY  PRELIMINARY  PRELIMINARY  PRELIMINARY  Bilateral lower extremity venous duplex completed.    Preliminary report:  Bilateral lower extremity venous duplex completed per protocol for deep vein thrombosis of the requested leg. Right:  DVT noted throughout the gastrocnemius vein from mid calf to the confluence with the popliteal vein  .  No evidence of superficial thrombosis.  No Baker's cyst. The distal greater saphenous vein had been harvested for CABG. Left:  No evidence of DVT, superficial thrombosis, or Baker's cyst.  Elmire Amrein, RVTS 05/25/2014, 1:52 PM   \

## 2014-05-25 NOTE — ED Provider Notes (Signed)
CSN: [id number]     Arrival date & time 05/25/14  1218 History  This chart was scribed for non-physician practitioner [name] PA-C working with [name], * by [name], ED Scribe. This patient was seen in room TR06C/TR06C and the patient's care was started at 12:51 PM.    Chief Complaint  Patient presents with  . Fall    The history is provided by the patient. No language interpreter was used.    HPI Comments: Benjamin Baker is a 58 y.o. male with past medical history of DM, HTN, HLD who presents to the Emergency Department complaining of a fall that occurred last night. Patient states he was walking at night when he fell down. He is unsure of the exact mechanism of the fall. He reports constant, gradually worsening right calf pain that is worse with walking, bearing weight, and applied pressure. He describes the pain as burning, aching and rates it as 6-7/10 currently. He denies head injury or LOC. He denies numbness, weakness. He denies history of PE/DVT.  He denies fevers, chills, nausea, vomiting, hot red warm joint, shortness of breath, and chest pain.  All other ROS are negative.   Past Medical History  Diagnosis Date  . Diabetes mellitus   . GERD (gastroesophageal reflux disease)   . CAD (coronary artery disease)     a. 03/2002 - CABG x 3: LIMA > LAD, R Rad > OM2, SVG > PDA.    b. Cath 06/2012 - Significant three-vessel disease, 3/3 grafts patent. LVEF 60-65%   . Hypertension   . Hyperlipidemia   . Tobacco abuse     a. ongoing 06/2012  . NSTEMI (non-ST elevated myocardial infarction) September 2013    grafts patent; EF normal; manage medically  . Kidney stones   . Kidney stone   . Kidney stone on left side 05/11/2013   Past Surgical History  Procedure Laterality Date  . Coronary artery bypass graft  03/16/2002    a) x3: LIMA-LAD, R rad-Cx branch, SVG-PDA   . Cystoscopy with ureteroscopy and stent placement Left 05/12/2013    Procedure: CYSTOSCOPY WITH  URETEROSCOPY AND STENT PLACEMENT;  Surgeon: Malka So, MD;  Location: [facility];  Service: Urology;  Laterality: Left;  . Holmium laser application Left 10/08/4006    Procedure: HOLMIUM LASER APPLICATION;  Surgeon: Malka So, MD;  Location: [facility];  Service: Urology;  Laterality: Left;   Family History  Problem Relation Age of Onset  . Insulin resistance Mother 54  . Colon cancer Father 23  . Heart attack Father 54  . Stomach cancer Neg Hx    History  Substance Use Topics  . Smoking status: Current Every Day Smoker -- 0.50 packs/day for 30 years    Types: Cigarettes  . Smokeless tobacco: Never Used  . Alcohol Use: Yes     Comment: rarely.    Review of Systems  See HPI  Allergies  Review of patient's allergies indicates no known allergies.  Home Medications   Prior to Admission medications   Medication Sig Start Date End Date Taking? Authorizing Provider  cetirizine (ZYRTEC) 10 MG tablet Take 1 tablet (10 mg total) by mouth daily. 03/20/14   [name], MD  fluticasone (FLONASE) 50 MCG/ACT nasal spray Place 2 sprays into both nostrils daily. 02/20/14   [name], PA-C  HYDROcodone-acetaminophen (NORCO/VICODIN) 5-325 MG per tablet Take 2 tablets by mouth every 4 (four) hours as needed for moderate pain or severe  pain. 05/25/14   [name], PA-C  insulin NPH-regular Human (NOVOLIN 70/30) (70-30) 100 UNIT/ML injection Inject 45 units into the skin every morning with breakfast and inject 45 units into the skin every evening with supper 03/20/14   [name], MD  metFORMIN (GLUCOPHAGE) 1000 MG tablet Take 1 tablet (1,000 mg total) by mouth 2 (two) times daily with a meal. 03/06/14   [name], MD  pravastatin (PRAVACHOL) 40 MG tablet Take 1 tablet (40 mg total) by mouth every evening. 03/06/14 03/06/15  [name], MD  XARELTO STARTER PACK 15 & 20 MG TBPK Take 15-20 mg by mouth as directed. Take as directed on package: Start with one 15mg  tablet by mouth twice a day  with food. On Day 22, switch to one 20mg  tablet once a day with food. 05/25/14   [name], PA-C   BP 129/71  Pulse 71  Temp(Src) 97.8 F (36.6 C) (Oral)  Resp 18  Ht 5\' 9"  (1.753 m)  Wt 215 lb (97.523 kg)  BMI 31.74 kg/m2  SpO2 100% Physical Exam  Nursing note and vitals reviewed. Constitutional: He is oriented to person, place, and time. He appears well-developed and well-nourished.  HENT:  Head: Normocephalic and atraumatic.  Mouth/Throat: Oropharynx is clear and moist. No oropharyngeal exudate.  Eyes: Conjunctivae and EOM are normal. Pupils are equal, round, and reactive to light. No scleral icterus.  Neck: Normal range of motion. Neck supple. No JVD present. No thyromegaly present.  Cardiovascular: Normal rate, regular rhythm, normal heart sounds and intact distal pulses.  Exam reveals no gallop and no friction rub.   No murmur heard. Right calf tenderness.  Positive holmans sign.  Palpable chords.  Negative femoral vein tenderness.  Pulmonary/Chest: Effort normal and breath sounds normal. No respiratory distress. He has no wheezes. He has no rales. He exhibits no tenderness.  Abdominal: He exhibits no distension.  Musculoskeletal: Normal range of motion.       Right knee: He exhibits normal range of motion, no swelling, no effusion, no ecchymosis, no deformity, no laceration, no erythema, normal alignment, no LCL laxity, normal patellar mobility, no bony tenderness, normal meniscus and no MCL laxity. No tenderness found. No medial joint line, no lateral joint line, no MCL, no LCL and no patellar tendon tenderness noted.       Right ankle: He exhibits normal range of motion, no swelling, no ecchymosis, no deformity, no laceration and normal pulse. Tenderness. AITFL and CF ligament tenderness found. No lateral malleolus, no medial malleolus, no posterior TFL, no head of 5th metatarsal and no proximal fibula tenderness found. Achilles tendon normal.       Right lower leg: He  exhibits tenderness. He exhibits no bony tenderness, no swelling, no edema, no deformity and no laceration.  Lymphadenopathy:    He has no cervical adenopathy.  Neurological: He is alert and oriented to person, place, and time.  Skin: Skin is warm and dry.  Psychiatric: He has a normal mood and affect. His behavior is normal. Judgment and thought content normal.    ED Course  Procedures (including critical care time)  DIAGNOSTIC STUDIES: Oxygen Saturation is 100% on RA, normal by my interpretation.    COORDINATION OF CARE: 1:01 PM Will order ultrasound of the right calf to rule out DVT. Discussed treatment plan with pt at bedside and pt agreed to plan.   Labs Review Labs Reviewed - No data to display  Imaging Review No results found.  EKG Interpretation None      MDM   Final diagnoses:  DVT (deep venous thrombosis), right  Right ankle sprain, initial encounter   Patient is a 58 y.o. Male who presents to the ED after a fall.  Physical exam reveals calf tenderness, positive holmans sign, and tenderness to the ATFL and CF ligaments.  Doppler ultrasound of the right leg was ordered which showed a DVT of the right gastrocnemius vein to the posterior knee.  The patient was treated here with lovenox 100 mg subcutaneously.  Patient was given prescriptions for xarelto and hydrocodone.  Patient likely has a mild ankle sprain secondary to fall.  Patient is to follow-up with his PCP on Monday for further workup.  Patient to return for shortness of breath or PE symptoms.  Patient states understanding and agreement at this time.  Patient is stable for discharge.  I personally performed the services described in this documentation, which was scribed in my presence. The recorded information has been reviewed and is accurate.   Cherylann Parr, PA-C 05/25/14 1733

## 2014-05-26 NOTE — Progress Notes (Signed)
CARE MANAGEMENT NOTE 05/26/2014  Patient:  Benjamin Baker, Benjamin Baker   Account Number:  [id number]  Date Initiated:  05/26/2014  Documentation initiated by:  [facility]  Subjective/Objective Assessment:   ED Floor [facility], Ivin Booty called requesting a Xarelto Card     Action/Plan:   Anticipated DC Date:     Anticipated DC Plan:           Choice offered to / List presented to:             Status of service:  Completed, signed off Medicare Important Message given?   (If response is "NO", the following Medicare IM given date fields will be blank) Date Medicare IM given:   Medicare IM given by:   Date Additional Medicare IM given:   Additional Medicare IM given by:    Discharge Disposition:    Per UR Regulation:    If discussed at Long Length of Stay Meetings, dates discussed:    Comments:  05/26/14 12;00 CM received call from floor mgr, Ivin Booty requesting I arrange for a free Xarelto card be faxed to [facility].  CM called [facility] [phone] and spoke to [place] who requested I fax card to her at [phone]. CM faxed the card per Nicole's request and called pt.  Pt reported he was able to use the card for 15mg  but had another prescription for the 20mg  for 9 additional days. Pharmacy stated they will try to work it out. Pt verbalized understanding he is to call his MD to let them know he cannot afford this medication and can they prescribe a more affordable medication.  No other CM needs were communicated.  Mariane Masters, BSN, [place].

## 2014-05-26 NOTE — Progress Notes (Signed)
ED Cm received call from patient stating he is not able to afford to fill his prescription for Xarelto. Discussed the 30 day free Xarelto  Trial savings card. Pt agreeable. Request to fax to [facility] on [address] [phone]. Fax confirmation received. Pt instructed to contact pharmacy regarding medication. Verbalized understanding. teach back done,.

## 2014-05-29 ENCOUNTER — Encounter: Payer: Self-pay | Admitting: Internal Medicine

## 2014-05-29 ENCOUNTER — Ambulatory Visit (INDEPENDENT_AMBULATORY_CARE_PROVIDER_SITE_OTHER): Payer: No Typology Code available for payment source | Admitting: Internal Medicine

## 2014-05-29 ENCOUNTER — Ambulatory Visit (INDEPENDENT_AMBULATORY_CARE_PROVIDER_SITE_OTHER): Payer: No Typology Code available for payment source | Admitting: Dietician

## 2014-05-29 VITALS — BP 135/76 | HR 78 | Temp 98.1°F | Ht 69.0 in | Wt 210.0 lb

## 2014-05-29 DIAGNOSIS — I82401 Acute embolism and thrombosis of unspecified deep veins of right lower extremity: Secondary | ICD-10-CM

## 2014-05-29 DIAGNOSIS — E119 Type 2 diabetes mellitus without complications: Secondary | ICD-10-CM

## 2014-05-29 DIAGNOSIS — I10 Essential (primary) hypertension: Secondary | ICD-10-CM

## 2014-05-29 DIAGNOSIS — F17200 Nicotine dependence, unspecified, uncomplicated: Secondary | ICD-10-CM

## 2014-05-29 DIAGNOSIS — Z23 Encounter for immunization: Secondary | ICD-10-CM

## 2014-05-29 DIAGNOSIS — I82409 Acute embolism and thrombosis of unspecified deep veins of unspecified lower extremity: Secondary | ICD-10-CM | POA: Insufficient documentation

## 2014-05-29 DIAGNOSIS — Z0000 Encounter for general adult medical examination without abnormal findings: Secondary | ICD-10-CM

## 2014-05-29 LAB — COMPLETE METABOLIC PANEL WITH GFR
ALK PHOS: 122 U/L — AB (ref 39–117)
ALT: 19 U/L (ref 0–53)
AST: 18 U/L (ref 0–37)
Albumin: 4.1 g/dL (ref 3.5–5.2)
BILIRUBIN TOTAL: 1.1 mg/dL (ref 0.2–1.2)
BUN: 15 mg/dL (ref 6–23)
CO2: 25 meq/L (ref 19–32)
Calcium: 9.4 mg/dL (ref 8.4–10.5)
Chloride: 106 mEq/L (ref 96–112)
Creat: 0.84 mg/dL (ref 0.50–1.35)
GFR, Est African American: >89 mL/min
GFR, Est Non African American: >89 mL/min
GLUCOSE: 116 mg/dL — AB (ref 70–99)
POTASSIUM: 4 meq/L (ref 3.5–5.3)
Sodium: 139 mEq/L (ref 135–145)
TOTAL PROTEIN: 7.1 g/dL (ref 6.0–8.3)

## 2014-05-29 LAB — GLUCOSE, CAPILLARY: Glucose-Capillary: 165 mg/dL — ABNORMAL HIGH (ref 70–99)

## 2014-05-29 MED ORDER — VARENICLINE TARTRATE 0.5 MG X 11 & 1 MG X 42 PO MISC: ORAL | Status: DC

## 2014-05-29 NOTE — Progress Notes (Signed)
Patient ID: Benjamin Baker, male   DOB: Aug 07, 1956, 59 y.o.   MRN: [id number]    Subjective:   Patient ID: Benjamin Baker male    DOB: [birth year] 58 y.o.    MRN: [id number] Health Maintenance Due: Health Maintenance Due  Topic Date Due  . Tetanus/tdap  07/23/1977  . Influenza Vaccine  05/04/2014    _________________________________________________  HPI: Benjamin Baker is a 57 y.o. male here for a ED follow up visit for acute DVT right LE.  Pt has a PMH outlined below.  Please see problem-based charting assessment and plan note for further details of medical issues addressed at today's visit.  PMH: Past Medical History  Diagnosis Date  . Diabetes mellitus   . GERD (gastroesophageal reflux disease)   . CAD (coronary artery disease)     a. 03/2002 - CABG x 3: LIMA > LAD, R Rad > OM2, SVG > PDA.    b. Cath 06/2012 - Significant three-vessel disease, 3/3 grafts patent. LVEF 60-65%   . Hypertension   . Hyperlipidemia   . Tobacco abuse     a. ongoing 06/2012  . NSTEMI (non-ST elevated myocardial infarction) September 2013    grafts patent; EF normal; manage medically  . Kidney stones   . Kidney stone   . Kidney stone on left side 05/11/2013    Medications: Current Outpatient Prescriptions on File Prior to Visit  Medication Sig Dispense Refill  . insulin NPH-regular Human (NOVOLIN 70/30) (70-30) 100 UNIT/ML injection Inject 45 units into the skin every morning with breakfast and inject 45 units into the skin every evening with supper  30 mL  1  . metFORMIN (GLUCOPHAGE) 1000 MG tablet Take 1 tablet (1,000 mg total) by mouth 2 (two) times daily with a meal.  60 tablet  11  . pravastatin (PRAVACHOL) 40 MG tablet Take 1 tablet (40 mg total) by mouth every evening.  30 tablet  11  . XARELTO STARTER PACK 15 & 20 MG TBPK Take 15-20 mg by mouth as directed. Take as directed on package: Start with one 15mg  tablet by mouth twice a day with food. On Day 22, switch to one 20mg  tablet once a  day with food.  51 each  0   No current facility-administered medications on file prior to visit.    Allergies: No Known Allergies  FH: Family History  Problem Relation Age of Onset  . Insulin resistance Mother 56  . Colon cancer Father 13  . Heart attack Father 68  . Stomach cancer Neg Hx     SH: History   Social History  . Marital Status: Divorced    Spouse Name: N/A    Number of Children: N/A  . Years of Education: 85   Social History Main Topics  . Smoking status: Current Every Day Smoker -- 0.50 packs/day for 30 years    Types: Cigarettes  . Smokeless tobacco: Never Used  . Alcohol Use: Yes     Comment: rarely.  . Drug Use: No  . Sexual Activity: None   Other Topics Concern  . None   Social History Narrative  . None    Review of Systems: Constitutional: Negative for fever, chills and weight loss.  Eyes: Negative for blurred vision.  Respiratory: Negative for cough and shortness of breath.  Cardiovascular: Negative for chest pain, palpitations and leg swelling.  Gastrointestinal: Negative for nausea, vomiting, abdominal pain, diarrhea, constipation and blood in stool.  Genitourinary: Negative for dysuria, urgency and  frequency.  Musculoskeletal: Negative for myalgias and back pain.  Neurological: Negative for dizziness, weakness and headaches.     Objective:   Vital Signs: Filed Vitals:   05/29/14 1358 05/29/14 1400  BP:  135/76  Pulse:  78  Temp:  98.1 F (36.7 C)  TempSrc:  Oral  Height: 5\' 9"  (1.753 m)   Weight: 210 lb (95.255 kg)   SpO2:  99%     BP Readings from Last 3 Encounters:  05/29/14 135/76  05/25/14 129/71  03/20/14 141/79    Physical Exam: Constitutional: Vital signs reviewed.  Patient is well-developed and well-nourished in NAD and cooperative with exam.  Head: Normocephalic and atraumatic. Eyes: PERRL, EOMI, conjunctivae nl, no scleral icterus.  Neck: Supple. Cardiovascular: RRR, no MRG. Pulmonary/Chest: normal  effort, non-tender to palpation, CTAB, no wheezes, rales, or rhonchi. Abdominal: Soft. NT/ND +BS. Neurological: A&O x3, cranial nerves II-XII are grossly intact, moving all extremities. Extremities: 2+DP b/l; no pitting edema.  RLE without erythema, warmth, or swelling and no palpable cord.  Skin: Warm, dry and intact. No rash.   Assessment & Plan:   Assessment and plan was discussed and formulated with my attending.

## 2014-05-29 NOTE — Assessment & Plan Note (Signed)
-  flu vaccine given today

## 2014-05-29 NOTE — Assessment & Plan Note (Signed)
Pt reports that he was working [place] and "fell in a hole" and twisted is ankle.  Denies any dizziness or LOC.  He woke up on Saturday morning with extreme throbbing pain in his right calf.  No injury other than the fall the prior day.  Endorses some swelling, warmth, and mild erythema.  However, he said the pain was really what brought him to the ED.  Doppler in the ED was positive for DVT throughout the gastrocnemius vein from mid calf to the confluence with the popliteal vein in the RLE.  No DVT in LLE.  No FH of clotting disorders, however, reports that his father is on a "blood thinner" for unknown reasons.  He is a smoker with no previous h/o DVT, malignancy, or other risk factors.  He was started on xarelto in ED and told to follow up with his PCP.  He reports compliance with xarelto.  On physical exam, there is no RLE calf edema, erythema, warmth, or tenderness to palpation.   -continue xarelto for 3 months -return for follow-up to PCP

## 2014-05-29 NOTE — Patient Instructions (Signed)
Thank you for your visit today.   Please return to the internal medicine clinic in 1 month(s) or sooner if needed for a recheck of your blood sugars. You blood sugar is staying too high.  Please be sure to take your insulin as directed and your metformin.  [facility] to help you quit smoking.  Please begin taking this.   Continue to take xarelto for the blood clot in your leg.   We will check your kidney function and electrolytes today-->please stop by the lab.    Please be sure to bring all of your medications with you to every visit; this includes herbal supplements, vitamins, eye drops, and any over-the-counter medications.   Should you have any questions regarding your medications and/or any new or worsening symptoms, please be sure to call the clinic at [phone].   If you believe that you are suffering from a life threatening condition or one that may result in the loss of limb or function, then you should call 911 or proceed to the nearest Emergency Department.    Smoking Cessation, Tips for Success If you are ready to quit smoking, congratulations! You have chosen to help yourself be healthier. Cigarettes bring nicotine, tar, carbon monoxide, and other irritants into your body. Your lungs, heart, and blood vessels will be able to work better without these poisons. There are many different ways to quit smoking. Nicotine gum, nicotine patches, a nicotine inhaler, or nicotine nasal spray can help with physical craving. Hypnosis, support groups, and medicines help break the habit of smoking. WHAT THINGS CAN I DO TO MAKE QUITTING EASIER?  Here are some tips to help you quit for good:  Pick a date when you will quit smoking completely. Tell all of your friends and family about your plan to quit on that date.  Do not try to slowly cut down on the number of cigarettes you are smoking. Pick a quit date and quit smoking completely starting on that  day.  Throw away all cigarettes.   Clean and remove all ashtrays from your home, work, and car.  On a card, write down your reasons for quitting. Carry the card with you and read it when you get the urge to smoke.  Cleanse your body of nicotine. Drink enough water and fluids to keep your urine clear or pale yellow. Do this after quitting to flush the nicotine from your body.  Learn to predict your moods. Do not let a bad situation be your excuse to have a cigarette. Some situations in your life might tempt you into wanting a cigarette.  Never have "just one" cigarette. It leads to wanting another and another. Remind yourself of your decision to quit.  Change habits associated with smoking. If you smoked while driving or when feeling stressed, try other activities to replace smoking. Stand up when drinking your coffee. Brush your teeth after eating. Sit in a different chair when you read the paper. Avoid alcohol while trying to quit, and try to drink fewer caffeinated beverages. Alcohol and caffeine may urge you to smoke.  Avoid foods and drinks that can trigger a desire to smoke, such as sugary or spicy foods and alcohol.  Ask people who smoke not to smoke around you.  Have something planned to do right after eating or having a cup of coffee. For example, plan to take a walk or exercise.  Try a relaxation exercise to calm you down and decrease your  stress. Remember, you may be tense and nervous for the first 2 weeks after you quit, but this will pass.  Find new activities to keep your hands busy. Play with a pen, coin, or rubber band. Doodle or draw things on paper.  Brush your teeth right after eating. This will help cut down on the craving for the taste of tobacco after meals. You can also try mouthwash.   Use oral substitutes in place of cigarettes. Try using lemon drops, carrots, cinnamon sticks, or chewing gum. Keep them handy so they are available when you have the urge to  smoke.  When you have the urge to smoke, try deep breathing.  Designate your home as a nonsmoking area.  If you are a heavy smoker, ask your health care provider about a prescription for nicotine chewing gum. It can ease your withdrawal from nicotine.  Reward yourself. Set aside the cigarette money you save and buy yourself something nice.  Look for support from others. Join a support group or smoking cessation program. Ask someone at home or at work to help you with your plan to quit smoking.  Always ask yourself, "Do I need this cigarette or is this just a reflex?" Tell yourself, "Today, I choose not to smoke," or "I do not want to smoke." You are reminding yourself of your decision to quit.  Do not replace cigarette smoking with electronic cigarettes (commonly called e-cigarettes). The safety of e-cigarettes is unknown, and some may contain harmful chemicals.  If you relapse, do not give up! Plan ahead and think about what you will do the next time you get the urge to smoke. HOW WILL I FEEL WHEN I QUIT SMOKING? You may have symptoms of withdrawal because your body is used to nicotine (the addictive substance in cigarettes). You may crave cigarettes, be irritable, feel very hungry, cough often, get headaches, or have difficulty concentrating. The withdrawal symptoms are only temporary. They are strongest when you first quit but will go away within 10-14 days. When withdrawal symptoms occur, stay in control. Think about your reasons for quitting. Remind yourself that these are signs that your body is healing and getting used to being without cigarettes. Remember that withdrawal symptoms are easier to treat than the major diseases that smoking can cause.  Even after the withdrawal is over, expect periodic urges to smoke. However, these cravings are generally short lived and will go away whether you smoke or not. Do not smoke! WHAT RESOURCES ARE AVAILABLE TO HELP ME QUIT SMOKING? Your health care  provider can direct you to community resources or hospitals for support, which may include:  Group support.  Education.  Hypnosis.  Therapy. Document Released: 06/18/2004 Document Revised: 02/04/2014 Document Reviewed: 03/08/2013 [facility] Patient Information 2015 [place], [place]. This information is not intended to replace advice given to you by your health care provider. Make sure you discuss any questions you have with your health care provider.

## 2014-05-29 NOTE — Assessment & Plan Note (Signed)
Pt did not come in for DMII management, but last HA1c was 11.0 in June 2015.  Reports compliance with NPH 45 units twice daily and metformin 1010m twice daily. Unfortunately, he did not bring in his meds or his meter.  -referral to [employer] (she met with him after our OV) -follow up with PCP in 1 month

## 2014-05-29 NOTE — ED Provider Notes (Signed)
Medical screening examination/treatment/procedure(s) were performed by non-physician practitioner and as supervising physician I was immediately available for consultation/collaboration.   EKG Interpretation None        Houston Siren III, MD 05/29/14 [phone]

## 2014-05-29 NOTE — Assessment & Plan Note (Addendum)
BP 135/76 today.  Not on any meds.  -at goal, continue to monitor -continue pravastatin  -check CMP

## 2014-05-29 NOTE — Assessment & Plan Note (Signed)
-  counseled on smoking cessation; continues to smoke ~1/2 ppd and has multiple friends that smoke  -chantix script sent to [facility] (pt eligible for MAP)

## 2014-05-31 NOTE — Care Management Note (Signed)
    Page 1 of 1   05/26/2014     12:12:54 PM CARE MANAGEMENT NOTE 05/26/2014  Patient:  Benjamin Baker, Benjamin Baker   Account Number:  [id number]  Date Initiated:  05/26/2014  Documentation initiated by:  [facility]  Subjective/Objective Assessment:   ED Floor [facility], Ivin Booty called requesting a Xarelto Card     Action/Plan:   Anticipated DC Date:     Anticipated DC Plan:           Choice offered to / List presented to:             Status of service:  Completed, signed off Medicare Important Message given?   (If response is "NO", the following Medicare IM given date fields will be blank) Date Medicare IM given:   Medicare IM given by:   Date Additional Medicare IM given:   Additional Medicare IM given by:    Discharge Disposition:    Per UR Regulation:    If discussed at Long Length of Stay Meetings, dates discussed:    Comments:  05/26/14 12;00 CM received call from floor mgr, Ivin Booty requesting I arrange for a free Xarelto card be faxed to [facility].  CM called [facility] [phone] and spoke to [place] who requested I fax card to her at [phone]. CM faxed the card per Nicole's request and called pt.  Pt reported he was able to use the card for 15mg  but had another prescription for the 20mg  for 9 additional days. Pharmacy stated they will try to work it out. Pt verbalized understanding he is to call his MD to let them know he cannot afford this medication and can they prescribe a more affordable medication.  No other CM needs were communicated.  Mariane Masters, BSN, [place].

## 2014-06-03 ENCOUNTER — Encounter: Payer: Self-pay | Admitting: Dietician

## 2014-06-03 NOTE — Progress Notes (Signed)
  Medical Nutrition Therapy:  Appt start time: 1500 end time:  0177.  Assessment:  Primary concerns today: length of visit/ ready to leave.  Patient hesitant to meet with RD, CDE today. He says he was here too long, doesn't know how this visit could help him. He knows what to do, it's just a matter of him doing it". When asked however, he could not state specific changes, but says the way he eats could change. He works part time in the evenings and cares for a grandchild.  Preferred Learning Style: No preference indicated  Learning Readiness: Not ready/Contemplating MEDICATIONS: noted patient reports adherence and  Purchasing regularly from [place] with correct injection technique BLOOD SUGARS: he reports they are always high no matter what he does- 200's most of the time DIETARY INTAKE: Usual eating pattern includes 3 meals and no reported snacks.  Everyday foods include fast food and sweet tea.  foods needed to make diet adequate include fruits,  Vegetables, nuts/seeds and whole grains.  24-hr recall:  B ( 8 AM): takes 45 units Novolin 70/30, then eats 830- 9 am- 4 days a week: ~ 2 cup bowl of frosted flakes and whole milk,  3 days a fried chicken biscuit L ( 1 ): skips 4 days a week, eats at [employer] 3 days a week: large FF, quarter pounder, sweet tea. D (630 PM takes 45 units of insulin): eats ~ 730 PM baked salmon, chicken, potato, ham sandwich, chips on work days Snk ( PM): did not report a snack Beverages: drinks water throughout day and water or sweet tea with meals Usual physical activity: works is active and caring for grandchild Weight 210#, BMI 31- obese Estimated energy needs: 2000 for weight loss, 2450 for maintenance 200-225 g carbohydrates 95-105 g protein 70-80 g fat  Progress Towards Goal(s):  No progress evident or goals set, but may have assisted patient in moving along the stages of change.   Nutritional Diagnosis:  NB-1.3 Not ready for diet/lifestyle change  As  related to competing priorities.  As evidenced by his report and lack of desire to discuss making changes. .    Intervention:  Nutrition education about signs of readiness to change and change process. Teaching Method Utilized: visual, auditory, Hands on Handouts given during visit include: none today were appropriate Barriers to learning/adherence to lifestyle change: financial, competing priorities, support Demonstrated degree of understanding via:  Teach Back   Monitoring/Evaluation:  Dietary intake, exercise, blood sugars, and body weight prn.

## 2014-06-04 NOTE — Progress Notes (Signed)
Internal Medicine Clinic Attending Date of visit: 05/29/2014   Case discussed with Dr. Gordy Levan soon after the resident saw the patient.  We reviewed the resident's history and exam and pertinent patient test results.  I agree with the assessment, diagnosis, and plan of care documented in the resident's note.

## 2014-07-02 ENCOUNTER — Encounter: Payer: Self-pay | Admitting: *Deleted

## 2014-09-12 ENCOUNTER — Encounter (HOSPITAL_COMMUNITY): Payer: Self-pay | Admitting: Cardiovascular Disease

## 2014-10-02 IMAGING — CR DG CHEST 1V PORT
1 series · 1 of 1 positions shown · non-contrast
Comparison: Plain film chest 06/28/2012.

CLINICAL DATA: Chest and low back pain.

PORTABLE CHEST - 1 VIEW

[AP]
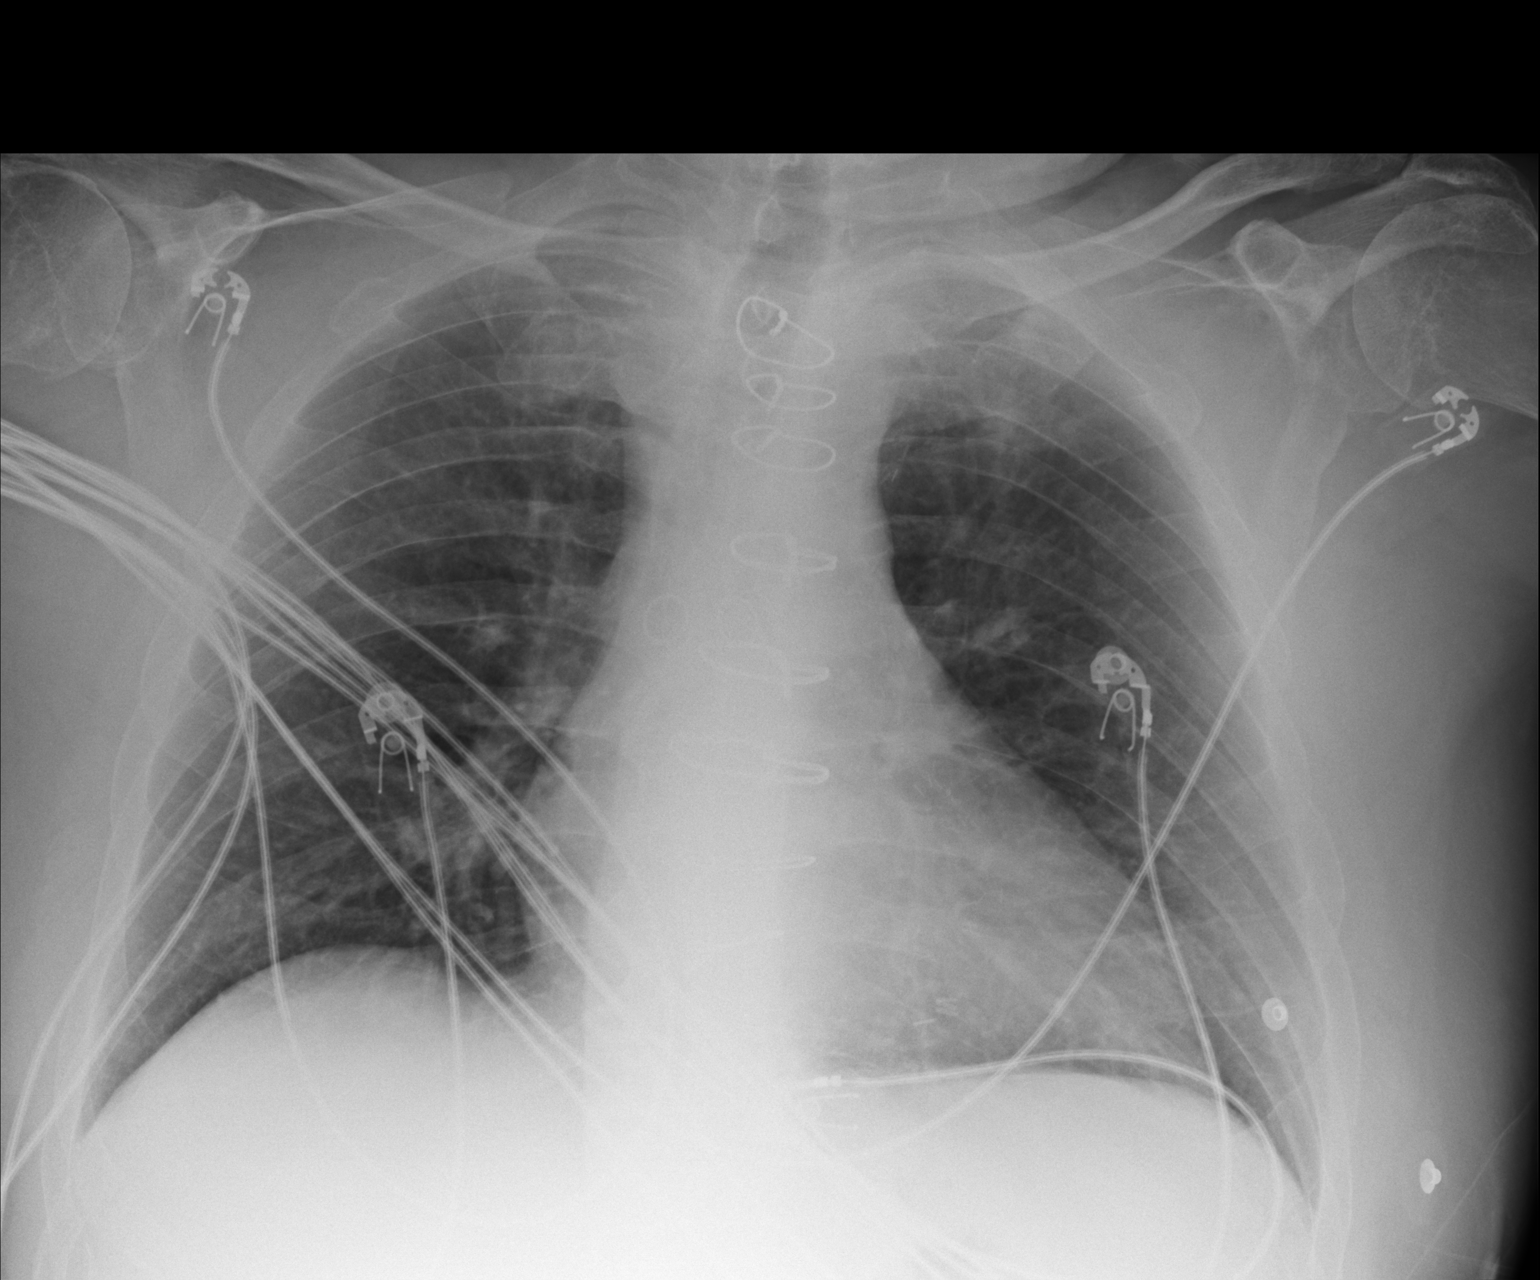

[1 of 1 positions shown; findings below may reference images not displayed]

FINDINGS: Lungs are clear.  Heart size is normal.  No pneumothorax
or pleural effusion.  The patient is status post CABG.
IMPRESSION: No acute disease.

## 2014-10-02 IMAGING — CT CT ABD-PELV W/O CM
2 of 4 series · 16 of 46 positions shown, 18 images · non-contrast
Comparison: None.

CLINICAL DATA: Left flank and left groin pain, hematuria, some
shortness of breath, history of prior kidney stones

CT ABDOMEN AND PELVIS WITHOUT CONTRAST
TECHNIQUE: Multidetector CT imaging of the abdomen and pelvis was
performed following the standard protocol without intravenous
contrast.

[Series 2: stone study 5.0 i30f 1 · axial · 0.84mm/px · z∈[+858,+1313]mm · 13 of 101 slices shown, 15 images]
[im 5/101  soft-tissue]
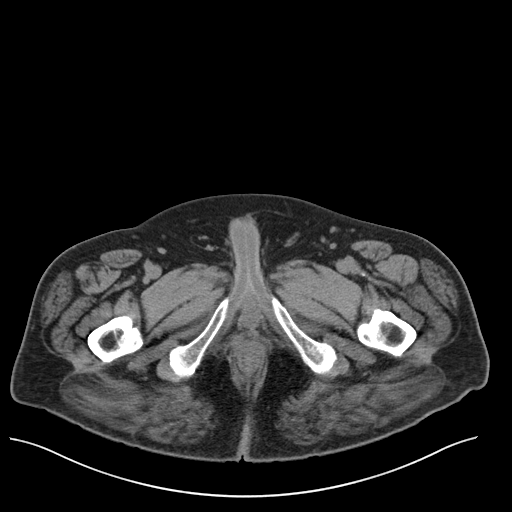
[im 5/101  bone]
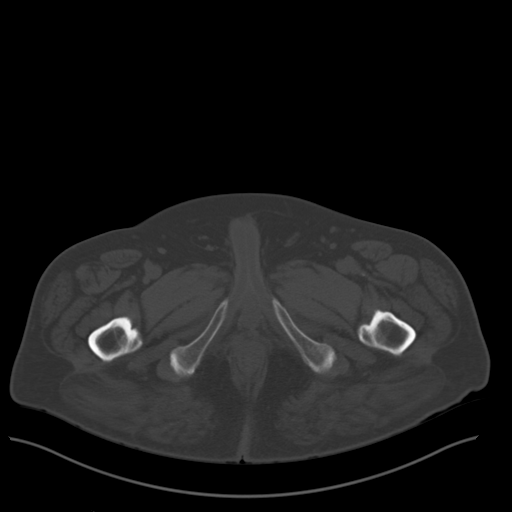
[im 14/101  soft-tissue]
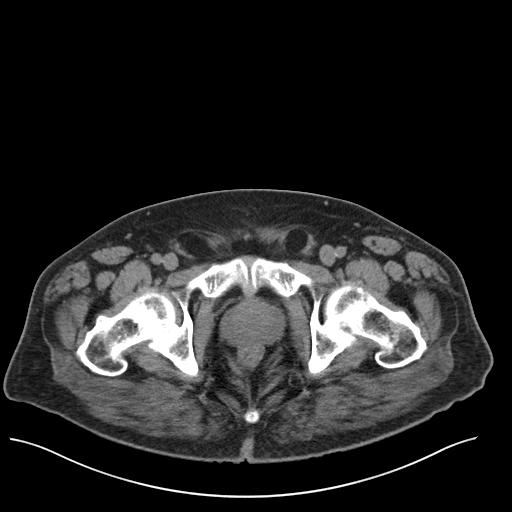
[im 22/101  soft-tissue]
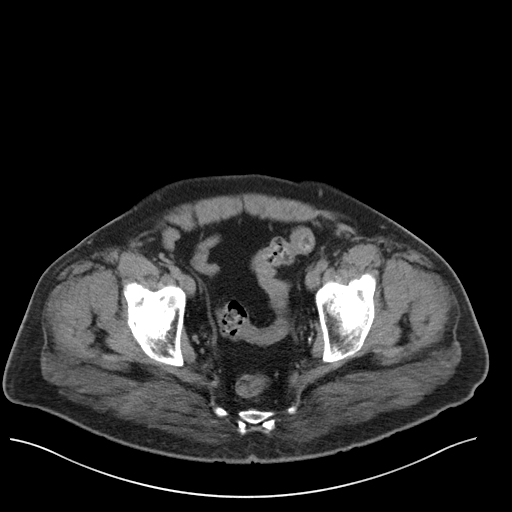
[im 27/101  soft-tissue]
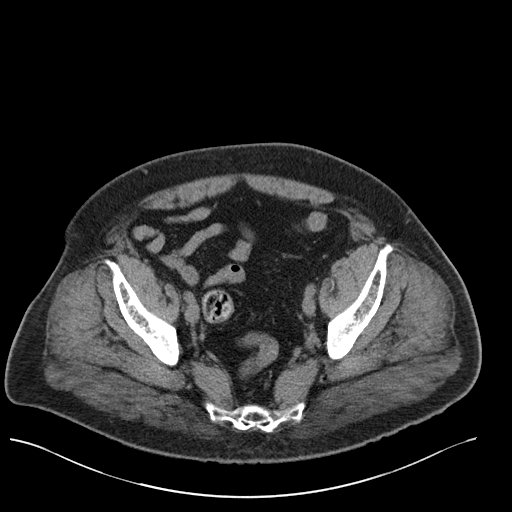
[im 35/101  soft-tissue]
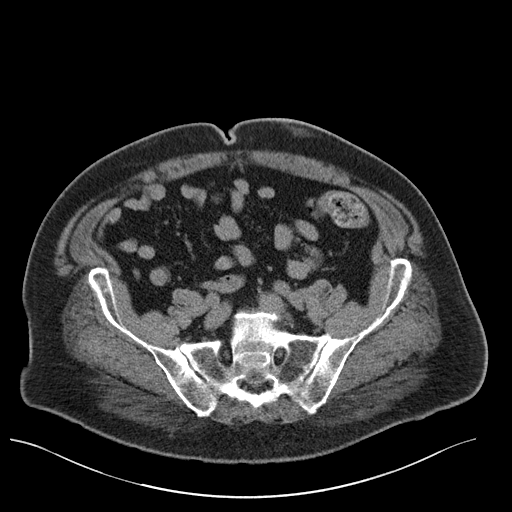
[im 44/101  soft-tissue]
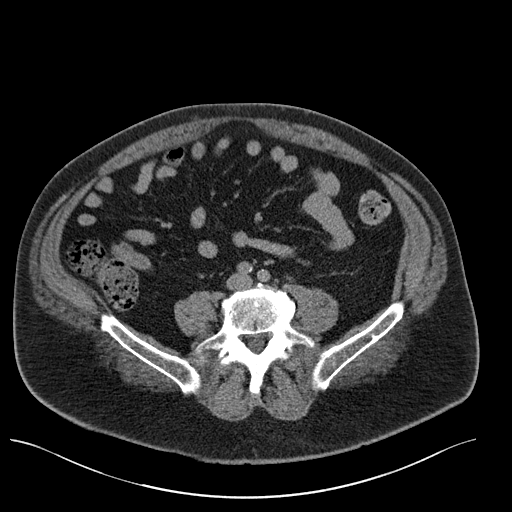
[im 53/101  soft-tissue]
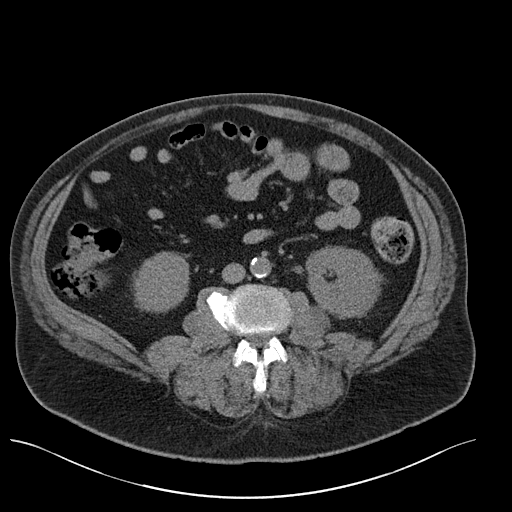
[im 57/101  soft-tissue]
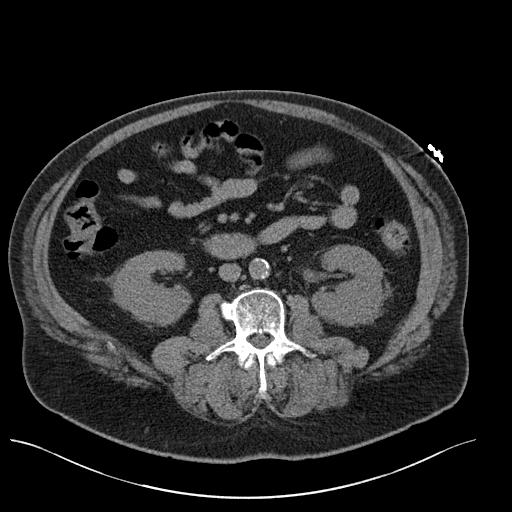
[im 66/101  soft-tissue]
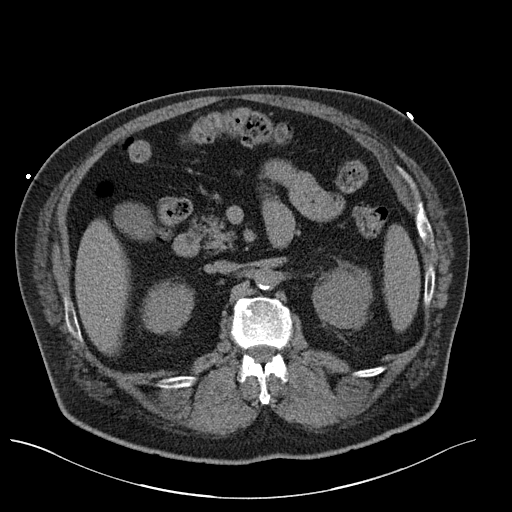
[im 66/101  bone]
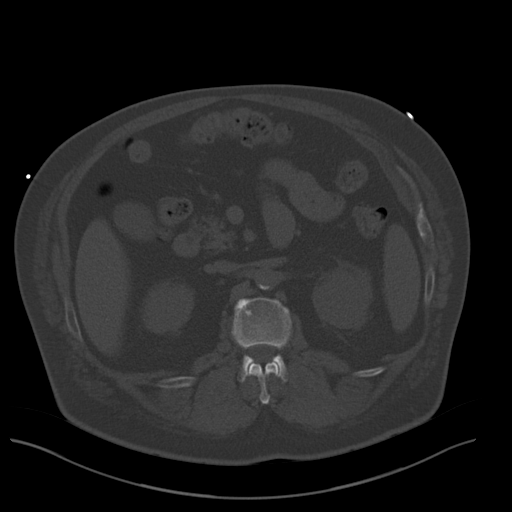
[im 74/101  soft-tissue]
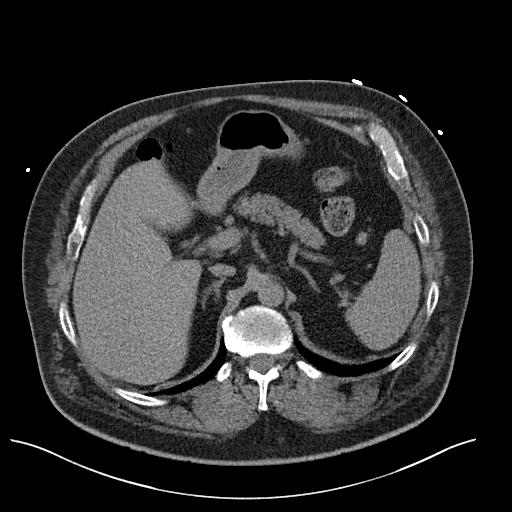
[im 79/101  soft-tissue]
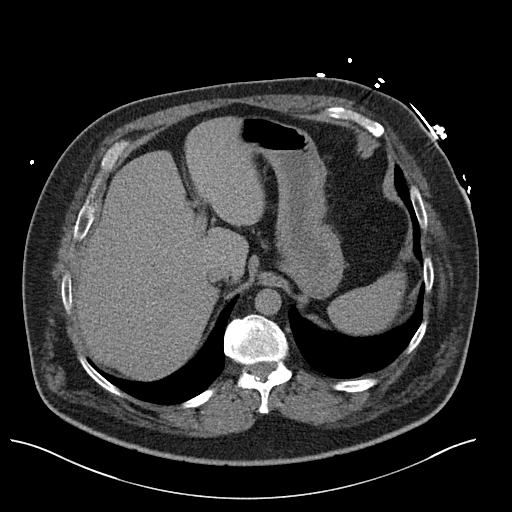
[im 87/101  soft-tissue]
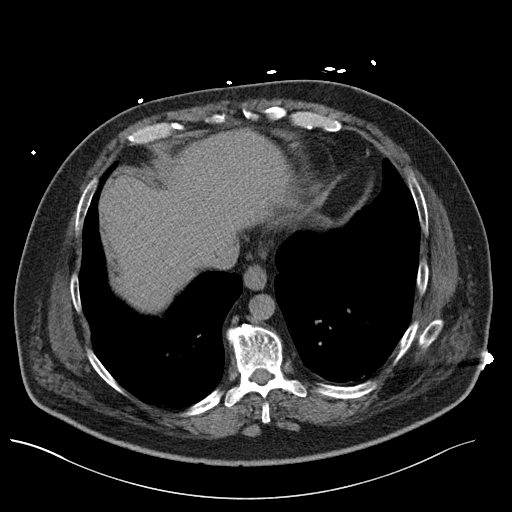
[im 96/101  soft-tissue]
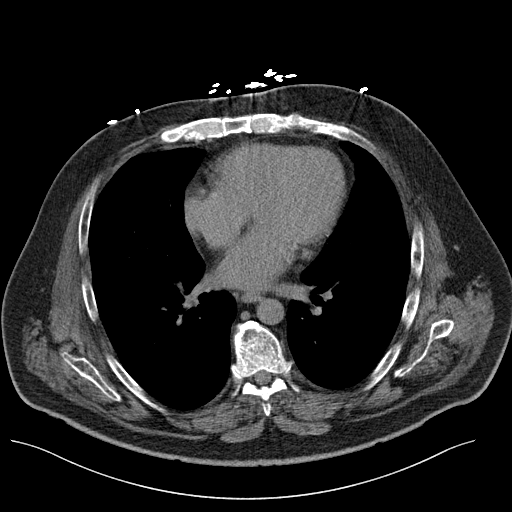

[Series 5: coronal soft tissue · coronal · 0.98mm/px · 3 of 93 slices shown]
[im 31/93  soft-tissue]
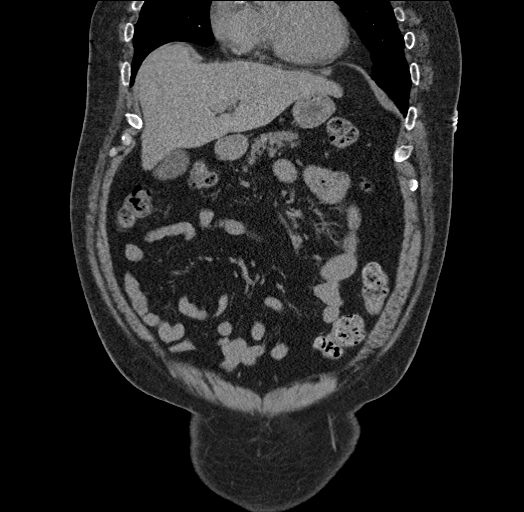
[im 41/93  soft-tissue]
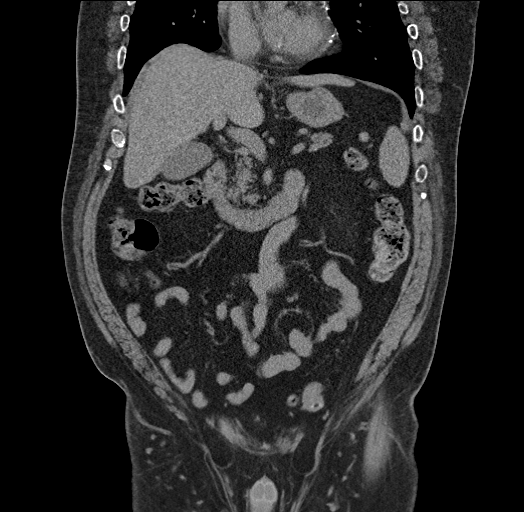
[im 52/93  soft-tissue]
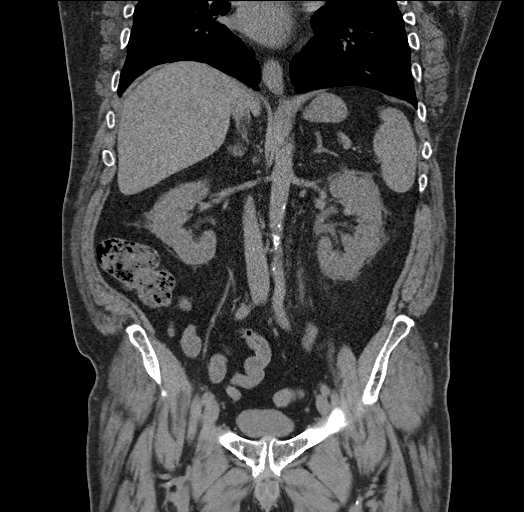

[16 of 46 positions shown; findings below may reference images not displayed]

FINDINGS: The lung bases are clear.  The liver is unremarkable in
the unenhanced state.  No calcified gallstones are seen although
there may be sludge layering in the gallbladder.  The pancreas is
normal in size and the pancreatic duct is not dilated.  The adrenal
glands and spleen are unremarkable.  The stomach is decompressed.

No definite renal calculi are seen.  The right pelvocaliceal system
is unremarkable.  However, there is slight fullness of the left
pelvocaliceal system to a point of low grade obstruction by a
proximal left ureteral calculus of 6 x 5 x 8 mm.  The abdominal
aorta is normal in caliber with mild atheromatous change present
for age.

The distal ureters are normal in caliber and no distal ureteral
calculus is seen.  The urinary bladder is not well distended.  The
prostate is within normal limits in size.  No fluid is seen within
the pelvis.  No abnormality of the colon is seen.  The terminal
ileum and the appendix are unremarkable.  Fat enters both inguinal
canals.

On coronal bone window images, there is irregularity of the right
femoral head which could indicate a small focus of avascular
necrosis.  A benign-appearing sclerotic bone island is noted in the
right iliac wing near the acetabulum.  The lumbar vertebrae are in
normal alignment with degenerative change throughout the facet
joints of the lower lumbar spine.  Intervertebral disc spaces are
relatively well preserved with mild vacuum disc phenomenon at L5-
S1.
IMPRESSION: 1.  Low grade obstruction of the left kidney by a proximal left
ureteral calculus of 6 x 5 x 8 mm.
2.  No other renal calculi are seen.
3.  Question of a small focus of avascular necrosis of the right
femoral head.
4.  Degenerative change throughout the facet joints of the lower
lumbar spine.

## 2014-10-07 ENCOUNTER — Other Ambulatory Visit: Payer: Self-pay | Admitting: *Deleted

## 2014-10-07 DIAGNOSIS — E1165 Type 2 diabetes mellitus with hyperglycemia: Secondary | ICD-10-CM

## 2014-10-08 MED ORDER — METFORMIN HCL 1000 MG PO TABS: 1000.0000 mg | ORAL_TABLET | Freq: Two times a day (BID) | ORAL | Status: AC

## 2014-10-08 MED ORDER — INSULIN NPH ISOPHANE & REGULAR (70-30) 100 UNIT/ML ~~LOC~~ SUSP: SUBCUTANEOUS | Status: DC

## 2014-10-09 NOTE — Telephone Encounter (Signed)
Metformin called in.  

## 2014-10-11 ENCOUNTER — Observation Stay: Payer: Self-pay | Admitting: Specialist

## 2014-10-11 LAB — PROTIME-INR
INR: 0.9
PROTHROMBIN TIME: 12.4 s (ref 11.5–14.7)

## 2014-10-11 LAB — COMPREHENSIVE METABOLIC PANEL
ALBUMIN: 3.1 g/dL — AB (ref 3.4–5.0)
Alkaline Phosphatase: 141 U/L — ABNORMAL HIGH
Anion Gap: 11 (ref 7–16)
BILIRUBIN TOTAL: 1.2 mg/dL — AB (ref 0.2–1.0)
BUN: 16 mg/dL (ref 7–18)
CALCIUM: 8.3 mg/dL — AB (ref 8.5–10.1)
Chloride: 104 mmol/L (ref 98–107)
Co2: 21 mmol/L (ref 21–32)
Creatinine: 1.13 mg/dL (ref 0.60–1.30)
EGFR (African American): >60
EGFR (Non-African Amer.): >60
Glucose: 346 mg/dL — ABNORMAL HIGH (ref 65–99)
OSMOLALITY: 287 (ref 275–301)
POTASSIUM: 4.1 mmol/L (ref 3.5–5.1)
SGOT(AST): 20 U/L (ref 15–37)
SGPT (ALT): 18 U/L
Sodium: 136 mmol/L (ref 136–145)
Total Protein: 6.5 g/dL (ref 6.4–8.2)

## 2014-10-11 LAB — CK TOTAL AND CKMB (NOT AT ARMC)
CK, TOTAL: 61 U/L (ref 39–308)
CK, TOTAL: 53 U/L (ref 39–308)
CK, Total: 50 U/L (ref 39–308)
CK-MB: 1.6 ng/mL (ref 0.5–3.6)
CK-MB: 1.6 ng/mL (ref 0.5–3.6)
CK-MB: 1.2 ng/mL (ref 0.5–3.6)

## 2014-10-11 LAB — TROPONIN I
Troponin-I: <0.02 ng/mL
Troponin-I: <0.02 ng/mL
Troponin-I: <0.02 ng/mL

## 2014-10-11 LAB — CBC
HCT: 39.2 % — ABNORMAL LOW (ref 40.0–52.0)
HGB: 12.6 g/dL — ABNORMAL LOW (ref 13.0–18.0)
MCH: 29.6 pg (ref 26.0–34.0)
MCHC: 32.2 g/dL (ref 32.0–36.0)
MCV: 92 fL (ref 80–100)
PLATELETS: 208 10*3/uL (ref 150–440)
RBC: 4.25 10*6/uL — AB (ref 4.40–5.90)
RDW: 13.5 % (ref 11.5–14.5)
WBC: 9.4 10*3/uL (ref 3.8–10.6)

## 2014-10-11 LAB — APTT: Activated PTT: 24.8 secs (ref 23.6–35.9)

## 2014-10-11 LAB — PRO B NATRIURETIC PEPTIDE: B-Type Natriuretic Peptide: 56 pg/mL (ref 0–125)

## 2014-10-12 LAB — BASIC METABOLIC PANEL
ANION GAP: 5 — AB (ref 7–16)
BUN: 17 mg/dL (ref 7–18)
Calcium, Total: 8.2 mg/dL — ABNORMAL LOW (ref 8.5–10.1)
Chloride: 106 mmol/L (ref 98–107)
Co2: 27 mmol/L (ref 21–32)
Creatinine: 1.11 mg/dL (ref 0.60–1.30)
EGFR (African American): >60
GLUCOSE: 223 mg/dL — AB (ref 65–99)
OSMOLALITY: 284 (ref 275–301)
POTASSIUM: 4.1 mmol/L (ref 3.5–5.1)
Sodium: 138 mmol/L (ref 136–145)

## 2014-10-12 LAB — CBC WITH DIFFERENTIAL/PLATELET
BASOS ABS: 0.1 10*3/uL (ref 0.0–0.1)
Basophil %: 0.6 %
EOS PCT: 2.6 %
Eosinophil #: 0.3 10*3/uL (ref 0.0–0.7)
HCT: 36.9 % — ABNORMAL LOW (ref 40.0–52.0)
HGB: 12.2 g/dL — ABNORMAL LOW (ref 13.0–18.0)
LYMPHS PCT: 41.6 %
Lymphocyte #: 4.1 10*3/uL — ABNORMAL HIGH (ref 1.0–3.6)
MCH: 30.3 pg (ref 26.0–34.0)
MCHC: 33.1 g/dL (ref 32.0–36.0)
MCV: 92 fL (ref 80–100)
Monocyte #: 0.8 x10 3/mm (ref 0.2–1.0)
Monocyte %: 7.7 %
Neutrophil #: 4.7 10*3/uL (ref 1.4–6.5)
Neutrophil %: 47.5 %
Platelet: 196 10*3/uL (ref 150–440)
RBC: 4.03 10*6/uL — ABNORMAL LOW (ref 4.40–5.90)
RDW: 13.8 % (ref 11.5–14.5)
WBC: 10 10*3/uL (ref 3.8–10.6)

## 2014-10-12 LAB — HEMOGLOBIN A1C: Hemoglobin A1C: 11.3 % — ABNORMAL HIGH (ref 4.2–6.3)

## 2014-10-12 LAB — LIPID PANEL
Cholesterol: 202 mg/dL — ABNORMAL HIGH (ref 0–200)
HDL: 25 mg/dL — AB (ref 40–60)
Ldl Cholesterol, Calc: 114 mg/dL — ABNORMAL HIGH (ref 0–100)
Triglycerides: 316 mg/dL — ABNORMAL HIGH (ref 0–200)
VLDL CHOLESTEROL, CALC: 63 mg/dL — AB (ref 5–40)

## 2014-10-12 LAB — TSH: THYROID STIMULATING HORM: 2.02 u[IU]/mL

## 2014-11-13 ENCOUNTER — Emergency Department (HOSPITAL_COMMUNITY)
Admission: EM | Admit: 2014-11-13 | Discharge: 2014-11-13 | Disposition: A | Discharge status: Home / Self Care | Payer: Self-pay | Attending: Emergency Medicine | Admitting: Emergency Medicine

## 2014-11-13 ENCOUNTER — Encounter (HOSPITAL_COMMUNITY): Payer: Self-pay | Admitting: Neurology

## 2014-11-13 ENCOUNTER — Emergency Department (HOSPITAL_COMMUNITY): Payer: Self-pay

## 2014-11-13 DIAGNOSIS — E119 Type 2 diabetes mellitus without complications: Secondary | ICD-10-CM | POA: Insufficient documentation

## 2014-11-13 DIAGNOSIS — I10 Essential (primary) hypertension: Secondary | ICD-10-CM | POA: Insufficient documentation

## 2014-11-13 DIAGNOSIS — Z8719 Personal history of other diseases of the digestive system: Secondary | ICD-10-CM | POA: Insufficient documentation

## 2014-11-13 DIAGNOSIS — Z720 Tobacco use: Secondary | ICD-10-CM | POA: Insufficient documentation

## 2014-11-13 DIAGNOSIS — E785 Hyperlipidemia, unspecified: Secondary | ICD-10-CM | POA: Insufficient documentation

## 2014-11-13 DIAGNOSIS — R059 Cough, unspecified: Secondary | ICD-10-CM

## 2014-11-13 DIAGNOSIS — I252 Old myocardial infarction: Secondary | ICD-10-CM | POA: Insufficient documentation

## 2014-11-13 DIAGNOSIS — R05 Cough: Secondary | ICD-10-CM

## 2014-11-13 DIAGNOSIS — Z87442 Personal history of urinary calculi: Secondary | ICD-10-CM | POA: Insufficient documentation

## 2014-11-13 DIAGNOSIS — I2510 Atherosclerotic heart disease of native coronary artery without angina pectoris: Secondary | ICD-10-CM | POA: Insufficient documentation

## 2014-11-13 DIAGNOSIS — Z951 Presence of aortocoronary bypass graft: Secondary | ICD-10-CM | POA: Insufficient documentation

## 2014-11-13 DIAGNOSIS — Z794 Long term (current) use of insulin: Secondary | ICD-10-CM | POA: Insufficient documentation

## 2014-11-13 DIAGNOSIS — J019 Acute sinusitis, unspecified: Secondary | ICD-10-CM | POA: Insufficient documentation

## 2014-11-13 DIAGNOSIS — Z79899 Other long term (current) drug therapy: Secondary | ICD-10-CM | POA: Insufficient documentation

## 2014-11-13 LAB — CBG MONITORING, ED: GLUCOSE-CAPILLARY: 199 mg/dL — AB (ref 70–99)

## 2014-11-13 MED ORDER — AMOXICILLIN 500 MG PO CAPS
500.0000 mg | ORAL_CAPSULE | Freq: Once | ORAL | Status: AC
  Administered 2014-11-13: 500 mg via ORAL
  Filled 2014-11-13: qty 1

## 2014-11-13 MED ORDER — LIDOCAINE VISCOUS 2 % MT SOLN
15.0000 mL | Freq: Once | OROMUCOSAL | Status: AC
  Administered 2014-11-13: 15 mL via OROMUCOSAL
  Filled 2014-11-13: qty 15

## 2014-11-13 MED ORDER — AMOXICILLIN 500 MG PO CAPS: 500.0000 mg | ORAL_CAPSULE | Freq: Three times a day (TID) | ORAL | Status: DC

## 2014-11-13 MED ORDER — HYDROCODONE-ACETAMINOPHEN 5-325 MG PO TABS: ORAL_TABLET | ORAL | Status: DC

## 2014-11-13 NOTE — Discharge Instructions (Signed)
Use nasal saline (you can try Arm [employer] Hammer Simply Saline) at least 4 times a day, use saline 5-10 minutes before using the fluticasone (flonase) nasal spray  Do not use Afrin (Oxymetazoline)  Rest, wash hands frequently  and drink plenty of water.  You may try counter medication such as Mucinex or Sudafed decongestant.  Take your antibiotics as directed and to completion. You should never have any leftover antibiotics! Push fluids and stay well hydrated.   Take vicodin for breakthrough pain, do not drink alcohol, drive, care for children or do other critical tasks while taking vicodin.  Please follow with your primary care doctor in the next 2 days for a check-up. They must obtain records for further management.   Do not hesitate to return to the Emergency Department for any new, worsening or concerning symptoms.

## 2014-11-13 NOTE — ED Notes (Addendum)
Pt reports cough, productive green sputum, runny nose, and headache for several days. Pt is a x 4.

## 2014-11-13 NOTE — ED Provider Notes (Signed)
CSN: [id number]     Arrival date & time 11/13/14  1287 History  This chart was scribed for non-physician practitioner Monico Blitz, PA-C working with Francine Graven, DO by Zola Button, ED Scribe. This patient was seen in room TR07C/TR07C and the patient's care was started at 10:09 AM.       Chief Complaint  Patient presents with  . Nasal Congestion   The history is provided by the patient. No language interpreter was used.    HPI Comments: Benjamin Baker is a 59 y.o. male with a hx of IDDM who presents to the Emergency Department complaining of gradual onset URI symptoms that started 1 week ago. Patient reports having productive cough of green/brown sputum, headache sore throat, chest congestion, sinus pressure, nasal discharge, and pressurizing ear pain when coughing. He also reports having subjective fever for 3 days starting 5 days ago. He states his worst symptoms are sore throat and sinus pressure. Patient has taken Flonase for his symptoms. He denies chest pain, SOB, nausea and vomiting. He reports that he was diagnosed with pneumonia in May 2015. NKDA.  Patient states his blood sugars have been around 230-240. He has not measured it this morning.  Past Medical History  Diagnosis Date  . Diabetes mellitus   . GERD (gastroesophageal reflux disease)   . CAD (coronary artery disease)     a. 03/2002 - CABG x 3: LIMA > LAD, R Rad > OM2, SVG > PDA.    b. Cath 06/2012 - Significant three-vessel disease, 3/3 grafts patent. LVEF 60-65%   . Hypertension   . Hyperlipidemia   . Tobacco abuse     a. ongoing 06/2012  . NSTEMI (non-ST elevated myocardial infarction) September 2013    grafts patent; EF normal; manage medically  . Kidney stones   . Kidney stone   . Kidney stone on left side 05/11/2013   Past Surgical History  Procedure Laterality Date  . Coronary artery bypass graft  03/16/2002    a) x3: LIMA-LAD, R rad-Cx branch, SVG-PDA   . Cystoscopy with ureteroscopy and stent placement  Left 05/12/2013    Procedure: CYSTOSCOPY WITH URETEROSCOPY AND STENT PLACEMENT;  Surgeon: Malka So, MD;  Location: [facility];  Service: Urology;  Laterality: Left;  . Holmium laser application Left 05/10/7671    Procedure: HOLMIUM LASER APPLICATION;  Surgeon: Malka So, MD;  Location: [facility];  Service: Urology;  Laterality: Left;  . Left heart catheterization with coronary/graft angiogram N/A 06/28/2012    Procedure: LEFT HEART CATHETERIZATION WITH Beatrix Fetters;  Surgeon: Sherren Mocha, MD;  Location: [facility];  Service: Cardiovascular;  Laterality: N/A;   Family History  Problem Relation Age of Onset  . Insulin resistance Mother 84  . Colon cancer Father 17  . Heart attack Father 77  . Stomach cancer Neg Hx    History  Substance Use Topics  . Smoking status: Current Every Day Smoker -- 0.50 packs/day for 30 years    Types: Cigarettes  . Smokeless tobacco: Never Used  . Alcohol Use: Yes     Comment: rarely.    Review of Systems A complete 10 system review of systems was obtained and all systems are negative except as noted in the HPI and PMH.     Allergies  Review of patient's allergies indicates no known allergies.  Home Medications   Prior to Admission medications   Medication Sig Start Date End Date Taking? Authorizing Provider  amoxicillin (AMOXIL) 500 MG capsule  Take 1 capsule (500 mg total) by mouth 3 (three) times daily. 11/13/14   Marwan Lipe, PA-C  HYDROcodone-acetaminophen (NORCO/VICODIN) 5-325 MG per tablet Take 1-2 tablets by mouth every 6 hours as needed for pain and/or cough. 11/13/14   Hakeen Shipes, PA-C  insulin NPH-regular Human (NOVOLIN 70/30) (70-30) 100 UNIT/ML injection Inject 45 units into the skin every morning with breakfast and inject 45 units into the skin every evening with supper 10/08/14   Dellia Nims, MD  metFORMIN (GLUCOPHAGE) 1000 MG tablet Take 1 tablet (1,000 mg total) by mouth 2 (two) times daily with a meal. 10/08/14   Tasrif  Ahmed, MD  pravastatin (PRAVACHOL) 40 MG tablet Take 1 tablet (40 mg total) by mouth every evening. 03/06/14 03/06/15  Pollie Friar, MD  varenicline (CHANTIX STARTING MONTH PAK) 0.5 MG X 11 & 1 MG X 42 tablet Take one 0.5 mg tablet by mouth once daily for 3 days, then increase to one 0.5 mg tablet twice daily for 4 days, then increase to one 1 mg tablet twice daily. 05/29/14   Jones Bales, MD  XARELTO STARTER PACK 15 & 20 MG TBPK Take 15-20 mg by mouth as directed. Take as directed on package: Start with one 15mg  tablet by mouth twice a day with food. On Day 22, switch to one 20mg  tablet once a day with food. 05/25/14   Courtney A Forcucci, PA-C   BP 145/79 mmHg  Pulse 78  Temp(Src) 97.6 F (36.4 C) (Oral)  Resp 16  Wt 210 lb (95.255 kg)  SpO2 99% Physical Exam  Constitutional: He is oriented to person, place, and time. He appears well-developed and well-nourished. No distress.  HENT:  Head: Normocephalic and atraumatic.  Mouth/Throat: Oropharynx is clear and moist. No oropharyngeal exudate.  No drooling or stridor. Posterior pharynx mildly erythematous no significant tonsillar hypertrophy. No exudate. Soft palate rises symmetrically. No TTP or induration under tongue.   Mild tenderness to palpation of the bilateral maxillary sinuses.  +  mucosal edema in the nares.  Purlulent nasal discharge  Bilateral tympanic membranes with normal architecture and good light reflex.    Eyes: Pupils are equal, round, and reactive to light.  Neck: Neck supple.  Cardiovascular: Normal rate, regular rhythm and intact distal pulses.   Pulmonary/Chest: Effort normal.  Musculoskeletal: He exhibits no edema.  Neurological: He is alert and oriented to person, place, and time. No cranial nerve deficit.  Skin: Skin is warm and dry. No rash noted.  Psychiatric: He has a normal mood and affect. His behavior is normal.  Nursing note and vitals reviewed.   ED Course  Procedures  DIAGNOSTIC  STUDIES: Oxygen Saturation is 99% on room air, normal by my interpretation.    COORDINATION OF CARE: 10:14 AM-Discussed treatment plan which includes abx and medications with pt at bedside and pt agreed to plan.    Labs Review Labs Reviewed  CBG MONITORING, ED - Abnormal; Notable for the following:    Glucose-Capillary 199 (*)    All other components within normal limits    Imaging Review Dg Chest 2 View  11/13/2014   CLINICAL DATA:  Cough.  EXAM: CHEST  2 VIEW  COMPARISON:  10/11/2014  FINDINGS: Normal heart size and stable aortic contours post CABG. There is no edema, consolidation, effusion, or pneumothorax. Rounded density at the left apex is osseous and stable based on comparison imaging.  IMPRESSION: No active cardiopulmonary disease.   Electronically Signed   By: Neva Seat.D.  On: 11/13/2014 10:32     EKG Interpretation None      MDM   Final diagnoses:  Cough  Acute sinusitis, recurrence not specified, unspecified location    Filed Vitals:   11/13/14 0955  BP: 145/79  Pulse: 78  Temp: 97.6 F (36.4 C)  TempSrc: Oral  Resp: 16  Weight: 210 lb (95.255 kg)  SpO2: 99%    Medications  amoxicillin (AMOXIL) capsule 500 mg (500 mg Oral Given 11/13/14 1040)  lidocaine (XYLOCAINE) 2 % viscous mouth solution 15 mL (15 mLs Mouth/Throat Given 11/13/14 1041)    Benjamin Baker is a pleasant 59 y.o. male presenting with nasal discharge, sinus pressure and productive cough. Patient is saturating well on room air, normal pulse and respiratory rate, he is afebrile in the ED. Chest x-rays ordered to rule out pneumonia. Patient will be treated with amoxicillin for acute sinusitis.  Evaluation does not show pathology that would require ongoing emergent intervention or inpatient treatment. Pt is hemodynamically stable and mentating appropriately. Discussed findings and plan with patient/guardian, who agrees with care plan. All questions answered. Return precautions discussed  and outpatient follow up given.   Discharge Medication List as of 11/13/2014 10:38 AM    START taking these medications   Details  amoxicillin (AMOXIL) 500 MG capsule Take 1 capsule (500 mg total) by mouth 3 (three) times daily., Starting 11/13/2014, Until Discontinued, Print    HYDROcodone-acetaminophen (NORCO/VICODIN) 5-325 MG per tablet Take 1-2 tablets by mouth every 6 hours as needed for pain and/or cough., Print         I personally performed the services described in this documentation, which was scribed in my presence. The recorded information has been reviewed and is accurate.   Monico Blitz, PA-C 11/13/14 [place], DO 11/13/14 1939

## 2014-11-13 NOTE — ED Notes (Signed)
Pt c/o nasal pressure, congestion, cough with green sputum x 1 week.

## 2014-12-20 ENCOUNTER — Other Ambulatory Visit: Payer: Self-pay | Admitting: *Deleted

## 2014-12-20 DIAGNOSIS — E1165 Type 2 diabetes mellitus with hyperglycemia: Secondary | ICD-10-CM

## 2014-12-23 MED ORDER — INSULIN NPH ISOPHANE & REGULAR (70-30) 100 UNIT/ML ~~LOC~~ SUSP: SUBCUTANEOUS | Status: AC

## 2015-02-02 NOTE — H&P (Signed)
PATIENT NAME:  Benjamin Baker, Benjamin Baker MR#:  [id number] DATE OF BIRTH:  11/18/1955  DATE OF ADMISSION:  10/11/2014  PRIMARY CARE PHYSICIAN: None local.   PRIMARY CARDIOLOGIST: Dr. Martinique.  REFERRING EMERGENCY ROOM PHYSICIAN: Dr. Jimmye Norman.  CHIEF COMPLAINT: Chest pain.   HISTORY OF PRESENT ILLNESS: The patient is a 59 year old pleasant Caucasian male with a past medical history of coronary artery disease, status post CABG, diabetes mellitus and hypertension who is presenting to the ED with a chief complaint of chest pain. The patient was in his usual state of health until this morning. He went to work, and, while he was at work, he suddenly started having left-sided chest pain radiating to the left shoulder associated with shortness of breath, dizziness and nausea. The pain was pressure like and persistent. EMS came in, and they gave him 4 baby aspirin and sublingual nitroglycerin. Subsequently, his chest pain trended down from 10/10 to 4/10. He was brought in to the ED. Initial troponin was negative. EKG with a lot of artifact, but somewhat flat T waves were noticed. He reports that his last stress test was done 13 years ago. He was resting comfortably during my examination. Denies any chest pain. The parents and daughter are at the bedside.   PAST MEDICAL HISTORY: Coronary artery disease, diabetes mellitus, hypertension, nicotine dependence, kidney stones.   PAST SURGICAL HISTORY: Coronary artery bypass grafting and lithotripsy.   ALLERGIES: No known drug allergies.   PSYCHOSOCIAL HISTORY: Lives at home, lives alone. Smokes 1 pack a day. Denies alcohol or illicit drug usage.   FAMILY HISTORY: Father has a history of colon cancer, coronary artery disease and diabetes mellitus.   HOME MEDICATIONS: Aspirin 81 mg p.o. once daily, metformin p.o. dose unknown, insulin aspart 45 units subcutaneously in the morning and bedtime.   REVIEW OF SYSTEMS:  CONSTITUTIONAL:  Denies any fever, fatigue or weakness.   EYES: Denies blurred vision or double vision.  ENT: Denies epistaxis or discharge.  RESPIRATION: Denies cough or COPD.  CARDIOVASCULAR: Had chest pain this morning which is resolved. No palpitations or syncope.  GASTROINTESTINAL: Denies nausea, vomiting, diarrhea or abdominal pain.  GENITOURINARY: No dysuria or hematuria.  ENDOCRINE: Denies polyuria, nocturia or thyroid problems.  HEMATOLOGIC/LYMPHATIC: No anemia or easy bruising or bleeding.  INTEGUMENT: No acne, rash or lesions.  MUSCULOSKELETAL: No joint pain in the neck and back. Denies gout.  NEUROLOGIC: Denies vertigo or ataxia.  PSYCHIATRIC: No ADD or OCD.   PHYSICAL EXAMINATION:  VITAL SIGNS: Temperature afebrile, pulse 67, respirations 18, pulse oximetry 100%, blood pressure 115/68.  GENERAL APPEARANCE: Not in acute distress, moderately built and well nourished.  HEENT: Normocephalic, atraumatic. Pupils equal, round and reactive to light and accommodation. No scleral icterus. No conjunctival injection. No sinus tenderness. No postnasal drip. Moist mucous membranes.  NECK: Supple. No JVD. No thyromegaly. Range of motion is intact.  LUNGS: Clear to auscultation bilaterally. No accessory muscle use and no anterior chest tenderness on palpation.  CARDIAC: S1, S2 normal. Regular rate and rhythm. No murmurs. No JVD.  GASTROINTESTINAL: Soft. Bowel sounds are positive in all 4 quadrants. Nontender, nondistended. No hepatosplenomegaly. No masses.  NEUROLOGIC: Awake, alert and oriented x3. Cranial nerves II through XII are grossly intact. Motor and sensory are intact. Reflexes are 2+.  EXTREMITIES: No edema. No cyanosis. No clubbing.  SKIN: Warm to touch. Normal turgor. No rashes. No lesions.   MUSCULOSKELETAL: No joint effusion, tenderness or erythema.  PSYCHIATRIC: Normal mood and affect.   LABORATORY DATA AND  IMAGING STUDIES: First set of troponin, CK and CPK are negative. WBC 9.4, hemoglobin 12.6,  hematocrit is 39.2, platelets  208,000. PT, INR and PTT are normal. LFTs: Total protein 6.5, albumin 3.1, alkaline phosphatase 141. AST and ALT are normal. Glucose 346. Calcium 8.3. BMP is normal. Natriuretic peptide is also normal.   Chest x-ray, portable: No active disease.   A 12-lead EKG with a lot of artifact and wavy baseline, looks like flat T waves. No acute ST-T wave changes.   ASSESSMENT AND PLAN: This is a 59 year old Caucasian male who presented to the ED with a chief complaint of chest pain resolved after getting sublingual nitroglycerin and aspirin with a history of coronary artery disease, status post CABG. Last stress test was 13 years ago. He will be admitted with the following:   1.  Chest pain, rule out acute myocardial infarction. The patient has significant risk factors given the history of coronary artery disease, status post CABG, age, gender male, diabetes mellitus. We will admit him under observation status to telemetry. Cycle cardiac biomarkers. We will provide ACS protocol with oxygen, nitroglycerin, aspirin, beta blocker and statin. An echocardiogram was ordered. A consult was placed to Dr. Neoma Laming who is on call cardiology.  2.  Coronary artery disease, status post coronary artery bypass graft. We will give him aspirin, statin and beta blocker. We will provide him morphine on an as needed basis for pain. Continue oxygen.  3.  Hypertension. The patient is not on any medications. We will put him on low-dose beta blocker and titrate on an as needed basis.  4.  Diabetes mellitus, seemed to be noncompliant. Check hemoglobin A1c. We will put him on sliding scale insulin at this point.  5.  History of kidney stones, status post lithotripsy. Currently denies any.  6.  Nicotine abuse. Counseled the patient to quit smoking for 4 to 5 minutes. He verbalized understanding. We will start him on nicotine patch after ruling him out for acute MI. We will provide GI and DVT prophylaxis.   The diagnosis and plan of  care were discussed in detail with the patient and his family at the bedside. He verbalized understanding. He is a full code. The parents and daughter are the medical power of attorney.   TOTAL TIME SPENT: 45 minutes.   ____________________________ Nicholes Mango, MD ag:JT D: 10/11/2014 12:06:52 ET T: 10/11/2014 12:37:59 ET JOB#: [id number]  cc: Nicholes Mango, MD, <Dictator> Dionisio David, MD Nicholes Mango MD ELECTRONICALLY SIGNED 10/19/2014 15:03

## 2015-02-02 NOTE — Discharge Summary (Signed)
PATIENT NAME:  Benjamin Baker, Benjamin Baker MR#:  [id number] DATE OF BIRTH:  1956-08-17  For a detailed note, please look at the history and physical done on admission by Dr. Margaretmary Eddy.  DIAGNOSES AT DISCHARGE: As follows:  1.  Chest pain.  2.  Uncontrolled diabetes.  3.  Hyperlipidemia.  4.  History of coronary artery disease, status post bypass.  5.  Medical noncompliance.   DIET: The patient is being discharged on a low-sodium, low-fat, carbohydrate-controlled diet.   ACTIVITY: As tolerated.   FOLLOWUP: With Dr. Neoma Laming in the next 1-2 weeks.   DISCHARGE MEDICATIONS: Aspirin 81 mg daily, metformin 500 mg b.i.d., metoprolol  tartrate 12.5 mg b.i.d., atorvastatin 20 mg daily, Humulin 70/30, 45 units b.i.d.   CONSULTANTS DURING THE HOSPITAL COURSE: Dionisio David, MD, from cardiology.   PERTINENT STUDIES DONE DURING THE HOSPITAL COURSE: As follows: A chest x-ray done on admission showing no acute cardiopulmonary disease. A nuclear medicine myocardial scan done showing no significant wall motion abnormality. EF of 67%. No EKG changes concerning for ischemia, region of mild ischemia in the anterior territory.   HOSPITAL COURSE: This is a 59 year old male with medical problems as mentioned above, presented to the hospital with chest pain.   Problem #1.  Chest pain. Given the patient's history of coronary artery disease with bypass, medical noncompliance, and ongoing tobacco abuse, he was a high risk patient for coronary artery disease. Therefore, he was admitted overnight on telemetry.  Had 3 sets of cardiac markers checked which were negative.  He underwent a nuclear medicine myocardial scan which showed no active evidence of acute wall motion abnormalities, but did show a possible reversible inferior wall ischemia, although the patient was completely asymptomatic. At present, the patient is being discharged on medical management with aspirin, statin, beta blocker, with close followup with cardiology as  an outpatient.   Problem #2.  Diabetes. This was uncontrolled for the patient. The patient  apparently had run out of his insulin for a few weeks and had not taken it. He was just doubling up his metformin. His  hemoglobin A1c was noted to be as high as 11. He was started on some sliding scale insulin, with carbohydrate-controlled diet, and presently his blood sugars have remained stable. At present, he is being discharged on his Novolin 70/30 along with his metformin. Further titrations to his diabetic medications can be done as an outpatient through his primary care physician.   Problem #3.  Hyperlipidemia. The patient's lipid profile showed a total cholesterol of 200 and LDL of 114. He was therefore discharged on a statin. The patient does not have any insurance; therefore, was referred to the clinic across the street from the hospital. He will follow up there and also with cardiology as an outpatient.   CODE STATUS: The patient is a full code.   TIME SPENT: 35 minutes.   ____________________________ Belia Heman. Verdell Carmine, MD vjs:LT D: 10/13/2014 16:49:22 ET T: 10/13/2014 20:35:32 ET JOB#: [id number]  cc: Belia Heman. Verdell Carmine, MD, <Dictator> Dionisio David, MD Henreitta Leber MD ELECTRONICALLY SIGNED 10/29/2014 11:49

## 2015-02-02 NOTE — Consult Note (Signed)
PATIENT NAME:  Benjamin Baker, Benjamin Baker MR#:  [id number] DATE OF BIRTH:  1956/09/13  DATE OF CONSULTATION:  10/11/2014  CONSULTING PHYSICIAN:  Dionisio David, MD   INDICATION FOR CONSULTATION: Chest pain.   HISTORY OF PRESENT ILLNESS: This is a 59 year old white male with a past medical history of coronary artery disease, status post CABG, according to him 3-vessel, at [facility] more than 15 years ago,  presented to the Emergency Room with chest pain. The chest pain was left precordial lasting 30 minutes, associated with shortness of breath. He was given 4 baby aspirin and sublingual nitroglycerin that relieved the chest pain.  He described the chest pain as heaviness. Initially 10/10, and then 4/10.  He is currently not having any chest pain.   PAST MEDICAL HISTORY: History of CABG as mentioned, 3-vessel more than 15 years ago at [facility], exact graft grafts are not available.  He has noninsulin-dependent diabetes. Currently he actually was supposed to be changed to insulin but he has not been taking it. He has been noncompliant with his medications in general.  His other history is history of hypertension, nicotine dependence, kidney stones.   SOCIAL HISTORY: Continues to smoke 1 pack per day. Denies EtOH abuse, any alcohol or illicit drug use. Apparently, he goes to [facility]. He does not have insurance.   FAMILY HISTORY: Positive for colon cancer, coronary artery disease and diabetes mellitus.   HOME MEDICATIONS: Aspirin 81 mg once a day, metformin. He does not know the dosage and he is supposed to be on insulin. He does not know the dosage.   PHYSICAL EXAMINATION:  GENERAL: He is alert and oriented x 3, in no acute distress right now.  He appears to be comfortably lying in bed.  VITAL SIGNS: Blood pressure is 132/80, respirations 20, pulse 73, temperature 97.5, saturation 98%.  NECK: No JVD. No carotid bruit.  LUNGS: Clear.  HEART: Regular rate and rhythm. Normal S1, S2. No  audible murmur.  ABDOMEN: Soft, nontender, positive bowel sounds.  EXTREMITIES: No pedal edema.  NEUROLOGIC: The patient appears to be intact.   LABS/STUDIES: EKG shows a normal sinus rhythm, questionable 0.5 mm ST elevation in leads II, III, aVF, it is not is probably just nonspecific ST-T changes.  It is not a STEMI as reported by the computer. The rest of the labs, troponin so far 2 sets are negative.   ASSESSMENT AND PLAN: Atypical chest pain, noncompliant with his medications. Advised putting the patient on nitro paste, rule out for myocardial infarction. Continue enoxaparin in the morning do nuclear stress test. Also we will get an echocardiogram.   Thank you very much for the referral.     ____________________________ Dionisio David, MD sak:DT D: 10/11/2014 15:10:52 ET T: 10/11/2014 15:40:45 ET JOB#: [id number]  cc: Dionisio David, MD, <Dictator> Dionisio David MD ELECTRONICALLY SIGNED 10/15/2014 15:38

## 2015-05-22 ENCOUNTER — Emergency Department: Payer: Self-pay

## 2015-05-22 ENCOUNTER — Other Ambulatory Visit: Payer: Self-pay

## 2015-05-22 ENCOUNTER — Emergency Department
Admission: EM | Admit: 2015-05-22 | Discharge: 2015-05-22 | Disposition: A | Discharge status: Home / Self Care | Payer: Self-pay | Attending: Emergency Medicine | Admitting: Emergency Medicine

## 2015-05-22 DIAGNOSIS — I252 Old myocardial infarction: Secondary | ICD-10-CM | POA: Insufficient documentation

## 2015-05-22 DIAGNOSIS — Z720 Tobacco use: Secondary | ICD-10-CM | POA: Insufficient documentation

## 2015-05-22 DIAGNOSIS — Z951 Presence of aortocoronary bypass graft: Secondary | ICD-10-CM | POA: Insufficient documentation

## 2015-05-22 DIAGNOSIS — Z79899 Other long term (current) drug therapy: Secondary | ICD-10-CM | POA: Insufficient documentation

## 2015-05-22 DIAGNOSIS — I2510 Atherosclerotic heart disease of native coronary artery without angina pectoris: Secondary | ICD-10-CM | POA: Insufficient documentation

## 2015-05-22 DIAGNOSIS — I10 Essential (primary) hypertension: Secondary | ICD-10-CM | POA: Insufficient documentation

## 2015-05-22 DIAGNOSIS — Z7901 Long term (current) use of anticoagulants: Secondary | ICD-10-CM | POA: Insufficient documentation

## 2015-05-22 DIAGNOSIS — Z9889 Other specified postprocedural states: Secondary | ICD-10-CM | POA: Insufficient documentation

## 2015-05-22 DIAGNOSIS — R079 Chest pain, unspecified: Secondary | ICD-10-CM | POA: Insufficient documentation

## 2015-05-22 DIAGNOSIS — E119 Type 2 diabetes mellitus without complications: Secondary | ICD-10-CM | POA: Insufficient documentation

## 2015-05-22 DIAGNOSIS — Z792 Long term (current) use of antibiotics: Secondary | ICD-10-CM | POA: Insufficient documentation

## 2015-05-22 DIAGNOSIS — Z794 Long term (current) use of insulin: Secondary | ICD-10-CM | POA: Insufficient documentation

## 2015-05-22 LAB — CBC
HCT: 42.4 % (ref 40.0–52.0)
Hemoglobin: 13.9 g/dL (ref 13.0–18.0)
MCH: 29.4 pg (ref 26.0–34.0)
MCHC: 32.8 g/dL (ref 32.0–36.0)
MCV: 89.8 fL (ref 80.0–100.0)
Platelets: 209 10*3/uL (ref 150–440)
RBC: 4.72 MIL/uL (ref 4.40–5.90)
RDW: 13.9 % (ref 11.5–14.5)
WBC: 9.9 10*3/uL (ref 3.8–10.6)

## 2015-05-22 LAB — BASIC METABOLIC PANEL
Anion gap: 6 (ref 5–15)
BUN: 14 mg/dL (ref 6–20)
CHLORIDE: 106 mmol/L (ref 101–111)
CO2: 28 mmol/L (ref 22–32)
Calcium: 9.2 mg/dL (ref 8.9–10.3)
Creatinine, Ser: 0.95 mg/dL (ref 0.61–1.24)
GFR calc Af Amer: >60 mL/min (ref >60)
GFR calc non Af Amer: >60 mL/min (ref >60)
Glucose, Bld: 204 mg/dL — ABNORMAL HIGH (ref 65–99)
Potassium: 4.3 mmol/L (ref 3.5–5.1)
SODIUM: 140 mmol/L (ref 135–145)

## 2015-05-22 LAB — TROPONIN I
Troponin I: <0.03 ng/mL
Troponin I: <0.03 ng/mL

## 2015-05-22 NOTE — ED Provider Notes (Signed)
[facility] Emergency Department Provider Note  ____________________________________________  Time seen: 2 PM  I have reviewed the triage vital signs and the nursing notes.   HISTORY  Chief Complaint Chest Pain and Nausea    HPI Benjamin Baker is a 59 y.o. male who presents with complaints of chest pain. He reports this started this morning on his way to work. He reports the pain traveled to his left arm but resolved after approximately 15 minutes. He has been pain-free during his time in the emergency department. Does have a history of a triple bypass 14 years ago but has not seen a cardiologist regularly and is not compliant with medications. He also smokes cigarettes. He reports the pain was moderate and crampy in nature     Past Medical History  Diagnosis Date  . Diabetes mellitus   . GERD (gastroesophageal reflux disease)   . CAD (coronary artery disease)     a. 03/2002 - CABG x 3: LIMA > LAD, R Rad > OM2, SVG > PDA.    b. Cath 06/2012 - Significant three-vessel disease, 3/3 grafts patent. LVEF 60-65%   . Hypertension   . Hyperlipidemia   . Tobacco abuse     a. ongoing 06/2012  . NSTEMI (non-ST elevated myocardial infarction) September 2013    grafts patent; EF normal; manage medically  . Kidney stones   . Kidney stone   . Kidney stone on left side 05/11/2013    Patient Active Problem List   Diagnosis Date Noted  . DVT, lower extremity 05/29/2014  . Non-proliferative diabetic retinopathy, mild, left eye 05/05/2014  . Seasonal allergies 03/06/2014  . Acute kidney injury 05/12/2013  . Nephrolithiasis 05/11/2013  . NSTEMI (non-ST elevated myocardial infarction) 06/29/2012  . Claudication 06/28/2012  . Preventative health care 08/29/2011  . DIZZINESS 10/24/2007  . DYSLIPIDEMIA 09/24/2006  . TOBACCO ABUSE 09/24/2006  . CATARACTS 09/24/2006  . HYPERTENSION 09/24/2006  . GERD 09/24/2006  . CORONARY ARTERY BYPASS GRAFT, THREE VESSEL, HX OF  09/24/2006  . DIABETES MELLITUS, TYPE II 10/04/2001    Past Surgical History  Procedure Laterality Date  . Coronary artery bypass graft  03/16/2002    a) x3: LIMA-LAD, R rad-Cx branch, SVG-PDA   . Cystoscopy with ureteroscopy and stent placement Left 05/12/2013    Procedure: CYSTOSCOPY WITH URETEROSCOPY AND STENT PLACEMENT;  Surgeon: Malka So, MD;  Location: [facility];  Service: Urology;  Laterality: Left;  . Holmium laser application Left 05/11/9168    Procedure: HOLMIUM LASER APPLICATION;  Surgeon: Malka So, MD;  Location: [facility];  Service: Urology;  Laterality: Left;  . Left heart catheterization with coronary/graft angiogram N/A 06/28/2012    Procedure: LEFT HEART CATHETERIZATION WITH Beatrix Fetters;  Surgeon: Sherren Mocha, MD;  Location: [facility] CATH LAB;  Service: Cardiovascular;  Laterality: N/A;    Current Outpatient Rx  Name  Route  Sig  Dispense  Refill  . amoxicillin (AMOXIL) 500 MG capsule   Oral   Take 1 capsule (500 mg total) by mouth 3 (three) times daily.   30 capsule   0   . HYDROcodone-acetaminophen (NORCO/VICODIN) 5-325 MG per tablet      Take 1-2 tablets by mouth every 6 hours as needed for pain and/or cough.   7 tablet   0   . insulin NPH-regular Human (NOVOLIN 70/30) (70-30) 100 UNIT/ML injection      Inject 45 units into the skin every morning with breakfast and inject 45 units into the  skin every evening with supper   30 mL   1     Dx code 250.0   . metFORMIN (GLUCOPHAGE) 1000 MG tablet   Oral   Take 1 tablet (1,000 mg total) by mouth 2 (two) times daily with a meal.   60 tablet   11   . varenicline (CHANTIX STARTING MONTH PAK) 0.5 MG X 11 & 1 MG X 42 tablet      Take one 0.5 mg tablet by mouth once daily for 3 days, then increase to one 0.5 mg tablet twice daily for 4 days, then increase to one 1 mg tablet twice daily.   53 tablet   0     MAP medication.   Alveda Reasons STARTER PACK 15 & 20 MG TBPK   Oral   Take 15-20 mg by mouth as  directed. Take as directed on package: Start with one 15mg  tablet by mouth twice a day with food. On Day 22, switch to one 20mg  tablet once a day with food.   51 each   0     Dispense as written.     Allergies Review of patient's allergies indicates no known allergies.  Family History  Problem Relation Age of Onset  . Insulin resistance Mother 92  . Colon cancer Father 56  . Heart attack Father 67  . Stomach cancer Neg Hx     Social History Social History  Substance Use Topics  . Smoking status: Current Every Day Smoker -- 0.50 packs/day for 30 years    Types: Cigarettes  . Smokeless tobacco: Never Used  . Alcohol Use: Yes     Comment: rarely.    Review of Systems  Constitutional: Negative for fever. Eyes: Negative for visual changes. ENT: Negative for sore throat Cardiovascular: Positive for chest pain Respiratory: Negative for shortness of breath. Gastrointestinal: Negative for abdominal pain, vomiting and diarrhea. Genitourinary: Negative for dysuria. Musculoskeletal: Negative for back pain. Skin: Negative for rash. Neurological: Negative for headaches or focal weakness Psychiatric: No anxiety    ____________________________________________   PHYSICAL EXAM:  VITAL SIGNS: ED Triage Vitals  Enc Vitals Group     BP 05/22/15 1002 132/71 mmHg     Pulse Rate 05/22/15 1002 80     Resp 05/22/15 1002 18     Temp 05/22/15 1002 98.6 F (37 C)     Temp Source 05/22/15 1002 Oral     SpO2 05/22/15 1002 99 %     Weight 05/22/15 1002 216 lb (97.977 kg)     Height 05/22/15 1002 5\' 9"  (1.753 m)     Head Cir --      Peak Flow --      Pain Score 05/22/15 1002 8     Pain Loc --      Pain Edu? --      Excl. in [place]? --      Constitutional: Alert and oriented. Well appearing and in no distress. Eyes: Conjunctivae are normal.  ENT   Head: Normocephalic and atraumatic.   Mouth/Throat: Mucous membranes are moist. Cardiovascular: Normal rate, regular rhythm.  Normal and symmetric distal pulses are present in all extremities. No murmurs, rubs, or gallops. Respiratory: Normal respiratory effort without tachypnea nor retractions. Breath sounds are clear and equal bilaterally.  Gastrointestinal: Soft and non-tender in all quadrants. No distention. There is no CVA tenderness. Genitourinary: deferred Musculoskeletal: Nontender with normal range of motion in all extremities. No lower extremity tenderness nor edema. Neurologic:  Normal speech and  language. No gross focal neurologic deficits are appreciated. Skin:  Skin is warm, dry and intact. No rash noted. Psychiatric: Mood and affect are normal. Patient exhibits appropriate insight and judgment.  ____________________________________________    LABS (pertinent positives/negatives)  Labs Reviewed  BASIC METABOLIC PANEL - Abnormal; Notable for the following:    Glucose, Bld 204 (*)    All other components within normal limits  CBC  TROPONIN I  TROPONIN I    ____________________________________________   EKG  ED ECG REPORT I, Lavonia Drafts, the attending physician, personally viewed and interpreted this ECG.   Date: 05/22/2015  EKG Time: 10:05 AM  Rate: 80  Rhythm: normal sinus rhythm  Axis: Normal  Intervals:none  ST&T Change: Nonspecific   ____________________________________________    RADIOLOGY I have personally reviewed any xrays that were ordered on this patient: Chest x-ray unremarkable  ____________________________________________   PROCEDURES  Procedure(s) performed: none  Critical Care performed: none  ____________________________________________   INITIAL IMPRESSION / ASSESSMENT AND PLAN / ED COURSE  Pertinent labs & imaging results that were available during my care of the patient were reviewed by me and considered in my medical decision making (see chart for details).  Patient with high risk chest pain even though his pain has resolved. Recommended  admission but the patient declined. He did agree to stay for a second troponin and agrees to follow-up with cardiology as an outpatient. He knows he can return any time if his chest pain returns. He had 2 negative troponins in the emergency department chest x-ray was unremarkable. His EKG was unremarkable. He will follow up with [facility] cardiology  ____________________________________________   FINAL CLINICAL IMPRESSION(S) / ED DIAGNOSES  Final diagnoses:  Chest pain, unspecified chest pain type     Lavonia Drafts, MD 05/22/15 [phone]

## 2015-05-22 NOTE — ED Notes (Addendum)
Report received from [place], [place].

## 2015-05-22 NOTE — ED Notes (Signed)
Patient to ED with report of left side chest pain that started this morning while at work. Patient reports pain going down into left arm and up to left neck. Reports nausea initially.

## 2015-05-22 NOTE — Discharge Instructions (Signed)

## 2015-05-27 ENCOUNTER — Ambulatory Visit
Admission: RE | Admit: 2015-05-27 | Discharge: 2015-05-27 | Disposition: A | Discharge status: Home / Self Care | Payer: Worker's Compensation | Source: Ambulatory Visit | Attending: Family | Admitting: Family

## 2015-05-27 ENCOUNTER — Other Ambulatory Visit: Payer: Self-pay | Admitting: Family

## 2015-05-27 DIAGNOSIS — T148XXA Other injury of unspecified body region, initial encounter: Secondary | ICD-10-CM

## 2015-05-27 DIAGNOSIS — M79645 Pain in left finger(s): Secondary | ICD-10-CM | POA: Insufficient documentation

## 2015-05-28 ENCOUNTER — Ambulatory Visit: Payer: Self-pay | Admitting: Physician Assistant

## 2015-08-07 ENCOUNTER — Encounter: Payer: Self-pay | Admitting: Gastroenterology

## 2015-11-23 ENCOUNTER — Emergency Department: Payer: Self-pay

## 2015-11-23 DIAGNOSIS — I209 Angina pectoris, unspecified: Secondary | ICD-10-CM | POA: Insufficient documentation

## 2015-11-23 DIAGNOSIS — E113292 Type 2 diabetes mellitus with mild nonproliferative diabetic retinopathy without macular edema, left eye: Secondary | ICD-10-CM | POA: Insufficient documentation

## 2015-11-23 DIAGNOSIS — I10 Essential (primary) hypertension: Secondary | ICD-10-CM | POA: Insufficient documentation

## 2015-11-23 DIAGNOSIS — I252 Old myocardial infarction: Secondary | ICD-10-CM | POA: Insufficient documentation

## 2015-11-23 DIAGNOSIS — Z79899 Other long term (current) drug therapy: Secondary | ICD-10-CM | POA: Insufficient documentation

## 2015-11-23 DIAGNOSIS — F17210 Nicotine dependence, cigarettes, uncomplicated: Secondary | ICD-10-CM | POA: Insufficient documentation

## 2015-11-23 DIAGNOSIS — Z7984 Long term (current) use of oral hypoglycemic drugs: Secondary | ICD-10-CM | POA: Insufficient documentation

## 2015-11-23 DIAGNOSIS — Z794 Long term (current) use of insulin: Secondary | ICD-10-CM | POA: Insufficient documentation

## 2015-11-23 DIAGNOSIS — Z792 Long term (current) use of antibiotics: Secondary | ICD-10-CM | POA: Insufficient documentation

## 2015-11-23 LAB — CBC
HCT: 39.8 % — ABNORMAL LOW (ref 40.0–52.0)
Hemoglobin: 13.1 g/dL (ref 13.0–18.0)
MCH: 29.4 pg (ref 26.0–34.0)
MCHC: 33 g/dL (ref 32.0–36.0)
MCV: 89.2 fL (ref 80.0–100.0)
PLATELETS: 207 10*3/uL (ref 150–440)
RBC: 4.47 MIL/uL (ref 4.40–5.90)
RDW: 13.5 % (ref 11.5–14.5)
WBC: 6.4 10*3/uL (ref 3.8–10.6)

## 2015-11-23 LAB — BASIC METABOLIC PANEL
Anion gap: 8 (ref 5–15)
BUN: 12 mg/dL (ref 6–20)
CALCIUM: 8.8 mg/dL — AB (ref 8.9–10.3)
CO2: 24 mmol/L (ref 22–32)
CREATININE: 0.78 mg/dL (ref 0.61–1.24)
Chloride: 107 mmol/L (ref 101–111)
GFR calc non Af Amer: >60 mL/min (ref >60)
Glucose, Bld: 240 mg/dL — ABNORMAL HIGH (ref 65–99)
Potassium: 4 mmol/L (ref 3.5–5.1)
SODIUM: 139 mmol/L (ref 135–145)

## 2015-11-23 LAB — TROPONIN I

## 2015-11-23 NOTE — ED Notes (Signed)
Patient ambulatory to triage with steady gait, without difficulty or distress noted; pt reports sudden onset left sided CP radiating into back hr PTA; denies accomp symptoms; st hx of same and hx card bypass

## 2015-11-24 ENCOUNTER — Emergency Department
Admission: EM | Admit: 2015-11-24 | Discharge: 2015-11-24 | Disposition: A | Discharge status: Home / Self Care | Payer: Self-pay | Attending: Emergency Medicine | Admitting: Emergency Medicine

## 2015-11-24 DIAGNOSIS — I209 Angina pectoris, unspecified: Secondary | ICD-10-CM

## 2015-11-24 LAB — TROPONIN I: Troponin I: <0.03 ng/mL (ref <0.031)

## 2015-11-24 MED ORDER — NITROGLYCERIN 0.4 MG SL SUBL
0.4000 mg | SUBLINGUAL_TABLET | SUBLINGUAL | Status: DC | PRN
  Administered 2015-11-24 (×2): 0.4 mg via SUBLINGUAL
  Filled 2015-11-24: qty 1

## 2015-11-24 MED ORDER — ASPIRIN 81 MG PO CHEW
324.0000 mg | CHEWABLE_TABLET | Freq: Once | ORAL | Status: AC
  Administered 2015-11-24: 324 mg via ORAL
  Filled 2015-11-24: qty 4

## 2015-11-24 NOTE — Discharge Instructions (Signed)

## 2015-11-24 NOTE — ED Provider Notes (Signed)
[facility] Emergency Department Provider Note  ____________________________________________  Time seen: Approximately 12:08 AM  I have reviewed the triage vital signs and the nursing notes.   HISTORY  Chief Complaint Chest Pain    HPI Benjamin Baker is a 60 y.o. male with a history of coronary artery disease status post CABG about 14 years ago and who last had a catheterization approximately 4 years ago.  He has been seen at different facilities in the past but he most recently saw Dr. Clayborn Bigness approximately 5 months ago.  He is being managed medically for angina as well as his insulin-dependent diabetes.  He has had an NSTEMI in the past.  He has had rare visits to the emergency department for chest pain, most recently about 5 or 6 months ago prior to his follow-up visit with Dr. Clayborn Bigness.  The patient reports that he does have occasional episodes of chest pain similar to that which he came to the emergency department for today.He describes the pain as beginning acutely while he was sitting watching television.  It started in his chest and upper back and is radiating through between the 2.  He described it as severe initially but now it is down to a 5 out of 10.  It feels like a pressure and a stabbing pain.  Nothing made it better and nothing made it worse.  He has nitroglycerin but he decided not to take it because it gives him a headache.  By his own admission and based on the note from Dr. Clayborn Bigness, the patient is noncompliant with his follow-up visits due to having no insurance.  He does state that he has been taking his medications as he is supposed to.   Past Medical History  Diagnosis Date  . Diabetes mellitus   . GERD (gastroesophageal reflux disease)   . CAD (coronary artery disease)     a. 03/2002 - CABG x 3: LIMA > LAD, R Rad > OM2, SVG > PDA.    b. Cath 06/2012 - Significant three-vessel disease, 3/3 grafts patent. LVEF 60-65%   . Hypertension   .  Hyperlipidemia   . Tobacco abuse     a. ongoing 06/2012  . NSTEMI (non-ST elevated myocardial infarction) September 2013    grafts patent; EF normal; manage medically  . Kidney stones   . Kidney stone   . Kidney stone on left side 05/11/2013    Patient Active Problem List   Diagnosis Date Noted  . DVT, lower extremity ([place]) 05/29/2014  . Non-proliferative diabetic retinopathy, mild, left eye ([place]) 05/05/2014  . Seasonal allergies 03/06/2014  . Acute kidney injury ([place]) 05/12/2013  . Nephrolithiasis 05/11/2013  . NSTEMI (non-ST elevated myocardial infarction) ([place]) 06/29/2012  . Claudication ([place]) 06/28/2012  . Preventative health care 08/29/2011  . DIZZINESS 10/24/2007  . DYSLIPIDEMIA 09/24/2006  . TOBACCO ABUSE 09/24/2006  . CATARACTS 09/24/2006  . HYPERTENSION 09/24/2006  . GERD 09/24/2006  . CORONARY ARTERY BYPASS GRAFT, THREE VESSEL, HX OF 09/24/2006  . DIABETES MELLITUS, TYPE II 10/04/2001    Past Surgical History  Procedure Laterality Date  . Coronary artery bypass graft  03/16/2002    a) x3: LIMA-LAD, R rad-Cx branch, SVG-PDA   . Cystoscopy with ureteroscopy and stent placement Left 05/12/2013    Procedure: CYSTOSCOPY WITH URETEROSCOPY AND STENT PLACEMENT;  Surgeon: Malka So, MD;  Location: [facility];  Service: Urology;  Laterality: Left;  . Holmium laser application Left [id number]    Procedure: HOLMIUM LASER  APPLICATION;  Surgeon: Malka So, MD;  Location: [facility];  Service: Urology;  Laterality: Left;  . Left heart catheterization with coronary/graft angiogram N/A 06/28/2012    Procedure: LEFT HEART CATHETERIZATION WITH Beatrix Fetters;  Surgeon: Sherren Mocha, MD;  Location: [facility] CATH LAB;  Service: Cardiovascular;  Laterality: N/A;    Current Outpatient Rx  Name  Route  Sig  Dispense  Refill  . amoxicillin (AMOXIL) 500 MG capsule   Oral   Take 1 capsule (500 mg total) by mouth 3 (three) times daily.   30 capsule   0   . HYDROcodone-acetaminophen  (NORCO/VICODIN) 5-325 MG per tablet      Take 1-2 tablets by mouth every 6 hours as needed for pain and/or cough.   7 tablet   0   . insulin NPH-regular Human (NOVOLIN 70/30) (70-30) 100 UNIT/ML injection      Inject 45 units into the skin every morning with breakfast and inject 45 units into the skin every evening with supper   30 mL   1     Dx code 250.0   . metFORMIN (GLUCOPHAGE) 1000 MG tablet   Oral   Take 1 tablet (1,000 mg total) by mouth 2 (two) times daily with a meal.   60 tablet   11   . varenicline (CHANTIX STARTING MONTH PAK) 0.5 MG X 11 & 1 MG X 42 tablet      Take one 0.5 mg tablet by mouth once daily for 3 days, then increase to one 0.5 mg tablet twice daily for 4 days, then increase to one 1 mg tablet twice daily.   53 tablet   0     MAP medication.   Alveda Reasons STARTER PACK 15 & 20 MG TBPK   Oral   Take 15-20 mg by mouth as directed. Take as directed on package: Start with one 15mg  tablet by mouth twice a day with food. On Day 22, switch to one 20mg  tablet once a day with food.   51 each   0     Dispense as written.     Allergies Review of patient's allergies indicates no known allergies.  Family History  Problem Relation Age of Onset  . Insulin resistance Mother 52  . Colon cancer Father 31  . Heart attack Father 59  . Stomach cancer Neg Hx     Social History Social History  Substance Use Topics  . Smoking status: Current Every Day Smoker -- 0.50 packs/day for 30 years    Types: Cigarettes  . Smokeless tobacco: Never Used  . Alcohol Use: Yes     Comment: rarely.    Review of Systems Constitutional: No fever/chills Eyes: No visual changes. ENT: No sore throat. Cardiovascular: Severe chest pain radiating to the back Respiratory: Mild redness of breath associated with chest pain, now resolved Gastrointestinal: No abdominal pain.  No nausea, no vomiting.  No diarrhea.  No constipation. Genitourinary: Negative for  dysuria. Musculoskeletal: Pain radiating from the chest into the upper back Skin: Negative for rash. Neurological: Negative for headaches, focal weakness or numbness.  10-point ROS otherwise negative.  ____________________________________________   PHYSICAL EXAM:  VITAL SIGNS: ED Triage Vitals  Enc Vitals Group     BP 11/23/15 1937 149/74 mmHg     Pulse Rate 11/23/15 1937 65     Resp 11/23/15 1937 18     Temp 11/23/15 1937 97.7 F (36.5 C)     Temp Source 11/23/15 1937 Oral  SpO2 11/23/15 1937 99 %     Weight 11/23/15 1937 215 lb (97.523 kg)     Height 11/23/15 1937 5\' 8"  (1.727 m)     Head Cir --      Peak Flow --      Pain Score 11/23/15 1937 9     Pain Loc --      Pain Edu? --      Excl. in [place]? --     Constitutional: Alert and oriented. Well appearing and in no acute distress.  Appears slightly uncomfortable. Eyes: Conjunctivae are normal. PERRL. EOMI. Head: Atraumatic. Nose: No congestion/rhinnorhea. Mouth/Throat: Mucous membranes are moist.  Oropharynx non-erythematous. Neck: No stridor.   Cardiovascular: Normal rate, regular rhythm. Grossly normal heart sounds.  Good peripheral circulation. Respiratory: Normal respiratory effort.  No retractions. Lungs CTAB. Gastrointestinal: Soft and nontender. No distention. No abdominal bruits. No CVA tenderness. Musculoskeletal: No lower extremity tenderness nor edema.  No joint effusions. Neurologic:  Normal speech and language. No gross focal neurologic deficits are appreciated.  Skin:  Skin is warm, dry and intact. No rash noted. Psychiatric: Mood and affect are normal. Speech and behavior are normal.  ____________________________________________   LABS (all labs ordered are listed, but only abnormal results are displayed)  Labs Reviewed  BASIC METABOLIC PANEL - Abnormal; Notable for the following:    Glucose, Bld 240 (*)    Calcium 8.8 (*)    All other components within normal limits  CBC - Abnormal; Notable  for the following:    HCT 39.8 (*)    All other components within normal limits  TROPONIN I  TROPONIN I   ____________________________________________  EKG  ED ECG REPORT I, Kendle Turbin, the attending physician, personally viewed and interpreted this ECG.  Date: 02/19017 EKG Time: 19:33 Rate: 66 Rhythm: Sinus rhythm with first-degree AV block QRS Axis: normal Intervals: Prolonged PR interval at 228 ms ST/T Wave abnormalities: normal Conduction Disturbances: none Narrative Interpretation: unremarkable  ____________________________________________  RADIOLOGY   Dg Chest 2 View  11/23/2015  CLINICAL DATA:  Chest pain and shortness of breath this evening. EXAM: CHEST  2 VIEW COMPARISON:  05/21/2015 FINDINGS: Patient is post CABG/ median sternotomy. The cardiomediastinal contours are normal. The lungs are clear. Pulmonary vasculature is normal. No consolidation, pleural effusion, or pneumothorax. No acute osseous abnormalities are seen. Degenerative change in the spine. Prominent left first rib costochondral cartilage. IMPRESSION: No acute pulmonary process. Electronically Signed   By: Jeb Levering M.D.   On: 11/23/2015 20:41    ____________________________________________   PROCEDURES  Procedure(s) performed: None  Critical Care performed: No ____________________________________________   INITIAL IMPRESSION / ASSESSMENT AND PLAN / ED COURSE  Pertinent labs & imaging results that were available during my care of the patient were reviewed by me and considered in my medical decision making (see chart for details).  The patient has known coronary artery disease but is also had known and stable angina pectoris for at least 6 months.  His symptoms today are consistent with prior, still little bit stronger in severity.  The pain eased off after 3 sublingual nitroglycerin in the emergency department and I encouraged him to use his nitroglycerin as needed at home.  I  considered bringing the patient into the hospital, but there is no indication for emergent intervention, and he does not want to stay in the hospital unless it is necessary.  I am sending a message through [facility] to Dr. Clayborn Bigness and ask him to facilitate close outpatient  follow-up, and his wife promises that she will make sure he goes to the clinic for follow-up as well.  The patient agrees that this is a good plan.  He has had 2 negative troponins and is pain-free at this time.  He agrees with the follow-up plan.  His vitals have been stable throughout 6.5 hours in the emergency department.  I gave my usual and customary return precautions.     ____________________________________________  FINAL CLINICAL IMPRESSION(S) / ED DIAGNOSES  Final diagnoses:  Ischemic chest pain ([place])      NEW MEDICATIONS STARTED DURING THIS VISIT:  New Prescriptions   No medications on file     Hinda Kehr, MD 11/24/15 0151

## 2017-06-07 ENCOUNTER — Emergency Department (HOSPITAL_COMMUNITY)
Admission: EM | Admit: 2017-06-07 | Discharge: 2017-06-07 | Disposition: A | Discharge status: Home / Self Care | Payer: Worker's Compensation | Attending: Emergency Medicine | Admitting: Emergency Medicine

## 2017-06-07 ENCOUNTER — Emergency Department (HOSPITAL_COMMUNITY): Payer: Worker's Compensation

## 2017-06-07 ENCOUNTER — Encounter (HOSPITAL_COMMUNITY): Payer: Self-pay | Admitting: Emergency Medicine

## 2017-06-07 DIAGNOSIS — I2510 Atherosclerotic heart disease of native coronary artery without angina pectoris: Secondary | ICD-10-CM | POA: Insufficient documentation

## 2017-06-07 DIAGNOSIS — Y92214 College as the place of occurrence of the external cause: Secondary | ICD-10-CM | POA: Insufficient documentation

## 2017-06-07 DIAGNOSIS — Z794 Long term (current) use of insulin: Secondary | ICD-10-CM | POA: Diagnosis not present

## 2017-06-07 DIAGNOSIS — Z951 Presence of aortocoronary bypass graft: Secondary | ICD-10-CM | POA: Insufficient documentation

## 2017-06-07 DIAGNOSIS — W108XXA Fall (on) (from) other stairs and steps, initial encounter: Secondary | ICD-10-CM | POA: Insufficient documentation

## 2017-06-07 DIAGNOSIS — I10 Essential (primary) hypertension: Secondary | ICD-10-CM | POA: Insufficient documentation

## 2017-06-07 DIAGNOSIS — F17210 Nicotine dependence, cigarettes, uncomplicated: Secondary | ICD-10-CM | POA: Diagnosis not present

## 2017-06-07 DIAGNOSIS — S99922A Unspecified injury of left foot, initial encounter: Secondary | ICD-10-CM | POA: Diagnosis present

## 2017-06-07 DIAGNOSIS — Y990 Civilian activity done for income or pay: Secondary | ICD-10-CM | POA: Insufficient documentation

## 2017-06-07 DIAGNOSIS — Z7901 Long term (current) use of anticoagulants: Secondary | ICD-10-CM | POA: Insufficient documentation

## 2017-06-07 DIAGNOSIS — Z79899 Other long term (current) drug therapy: Secondary | ICD-10-CM | POA: Insufficient documentation

## 2017-06-07 DIAGNOSIS — S93602A Unspecified sprain of left foot, initial encounter: Secondary | ICD-10-CM | POA: Diagnosis not present

## 2017-06-07 DIAGNOSIS — Y9301 Activity, walking, marching and hiking: Secondary | ICD-10-CM | POA: Insufficient documentation

## 2017-06-07 DIAGNOSIS — E119 Type 2 diabetes mellitus without complications: Secondary | ICD-10-CM | POA: Diagnosis not present

## 2017-06-07 NOTE — ED Triage Notes (Signed)
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

## 2017-06-07 NOTE — Discharge Instructions (Signed)
Wear Ace bandage for comfort and support.  Keep your foot elevated as frequently as possible.  Ibuprofen 600 mg every 6 hours as needed for pain.  Follow-up with your primary Dr. if not improving in the next week.

## 2017-06-07 NOTE — ED Provider Notes (Signed)
[facility] DEPT Provider Note   CSN: [id number] Arrival date & time: 06/07/17  0012     History   Chief Complaint Chief Complaint  Patient presents with  . Fall  . Foot Injury    HPI OBE Benjamin Baker is a 61 y.o. male.  Patient is a 61 year old male with past history of diabetes. He presents today for evaluation of a left foot injury. 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 He was walking through a dark building when he stepped awkwardly and fell down several steps. He has had pain in his foot since. He denies any other injury.   The history is provided by the patient.  Fall  This is a new problem. The current episode started 2 days ago. The problem occurs constantly. The problem has not changed since onset.The symptoms are aggravated by walking. The symptoms are relieved by rest. He has tried nothing for the symptoms.    Past Medical History:  Diagnosis Date  . CAD (coronary artery disease)    a. 03/2002 - CABG x 3: LIMA > LAD, R Rad > OM2, SVG > PDA.    b. Cath 06/2012 - Significant three-vessel disease, 3/3 grafts patent. LVEF 60-65%   . Diabetes mellitus   . GERD (gastroesophageal reflux disease)   . Hyperlipidemia   . Hypertension   . Kidney stone   . Kidney stone on left side 05/11/2013  . Kidney stones   . NSTEMI (non-ST elevated myocardial infarction) [facility]) September 2013   grafts patent; EF normal; manage medically  . Tobacco abuse    a. ongoing 06/2012    Patient Active Problem List   Diagnosis Date Noted  . DVT, lower extremity ([place]) 05/29/2014  . Non-proliferative diabetic retinopathy, mild, left eye ([place]) 05/05/2014  . Seasonal allergies 03/06/2014  . Acute kidney injury ([place]) 05/12/2013  . Nephrolithiasis 05/11/2013  . NSTEMI (non-ST elevated myocardial infarction) ([place]) 06/29/2012  . Claudication ([place]) 06/28/2012  . Preventative health care 08/29/2011  . DIZZINESS 10/24/2007  . DYSLIPIDEMIA 09/24/2006  . TOBACCO  ABUSE 09/24/2006  . CATARACTS 09/24/2006  . HYPERTENSION 09/24/2006  . GERD 09/24/2006  . CORONARY ARTERY BYPASS GRAFT, THREE VESSEL, HX OF 09/24/2006  . DIABETES MELLITUS, TYPE II 10/04/2001    Past Surgical History:  Procedure Laterality Date  . CORONARY ARTERY BYPASS GRAFT  03/16/2002   a) x3: LIMA-LAD, R rad-Cx branch, SVG-PDA   . CYSTOSCOPY WITH URETEROSCOPY AND STENT PLACEMENT Left 05/12/2013   Procedure: CYSTOSCOPY WITH URETEROSCOPY AND STENT PLACEMENT;  Surgeon: Malka So, MD;  Location: [facility] OR[facility];  Service: Urology;  Laterality: Left;  . HOLMIUM LASER APPLICATION Left 04/10/2422   Procedure: HOLMIUM LASER APPLICATION;  Surgeon: Malka So, MD;  Location: [facility];  Service: Urology;  Laterality: Left;  . LEFT HEART CATHETERIZATION WITH CORONARY/GRAFT ANGIOGRAM N/A 06/28/2012   Procedure: LEFT HEART CATHETERIZATION WITH Beatrix Fetters;  Surgeon: Sherren Mocha, MD;  Location: [facility] CATH LAB;  Service: Cardiovascular;  Laterality: N/A;       Home Medications    Prior to Admission medications   Medication Sig Start Date End Date Taking? Authorizing Provider  amoxicillin (AMOXIL) 500 MG capsule Take 1 capsule (500 mg total) by mouth 3 (three) times daily. 11/13/14   Pisciotta, Elmyra Ricks, PA-C  HYDROcodone-acetaminophen (NORCO/VICODIN) 5-325 MG per tablet Take 1-2 tablets by mouth every 6 hours as needed for pain and/or cough. 11/13/14   Pisciotta, Elmyra Ricks, PA-C  insulin NPH-regular Human (NOVOLIN 70/30) (  70-30) 100 UNIT/ML injection Inject 45 units into the skin every morning with breakfast and inject 45 units into the skin every evening with supper 12/23/14   Dellia Nims, MD  metFORMIN (GLUCOPHAGE) 1000 MG tablet Take 1 tablet (1,000 mg total) by mouth 2 (two) times daily with a meal. 10/08/14   Ahmed, Chesley Mires, MD  varenicline (CHANTIX STARTING MONTH PAK) 0.5 MG X 11 & 1 MG X 42 tablet Take one 0.5 mg tablet by mouth once daily for 3 days, then increase to one 0.5 mg tablet twice daily  for 4 days, then increase to one 1 mg tablet twice daily. 05/29/14   Jones Bales, MD  XARELTO STARTER PACK 15 & 20 MG TBPK Take 15-20 mg by mouth as directed. Take as directed on package: Start with one 15mg  tablet by mouth twice a day with food. On Day 22, switch to one 20mg  tablet once a day with food. 05/25/14   Forcucci, Loma Sousa, PA-C    Family History Family History  Problem Relation Age of Onset  . Insulin resistance Mother 55  . Colon cancer Father 97  . Heart attack Father 75  . Stomach cancer Neg Hx     Social History Social History  Substance Use Topics  . Smoking status: Current Every Day Smoker    Packs/day: 0.50    Years: 30.00    Types: Cigarettes  . Smokeless tobacco: Never Used  . Alcohol use No     Allergies   Patient has no known allergies.   Review of Systems Review of Systems  All other systems reviewed and are negative.    Physical Exam Updated Vital Signs BP (!) 124/91 (BP Location: Right Arm)   Pulse 75   Temp 98.3 F (36.8 C) (Oral)   Resp 17   Ht 5\' 9"  (1.753 m)   Wt 99.8 kg (220 lb)   SpO2 95%   BMI 32.49 kg/m   Physical Exam  Constitutional: He is oriented to person, place, and time. He appears well-developed and well-nourished. No distress.  HENT:  Head: Normocephalic and atraumatic.  Neck: Normal range of motion. Neck supple.  Musculoskeletal:  Left foot appears to have mild swelling to the dorsal aspect. There is no obvious deformity. There is tenderness to palpation over the metatarsals, however no palpable abnormality.  Neurological: He is alert and oriented to person, place, and time.  Skin: Skin is warm and dry. He is not diaphoretic.  Nursing note and vitals reviewed.    ED Treatments / Results  Labs (all labs ordered are listed, but only abnormal results are displayed) Labs Reviewed - No data to display  EKG  EKG Interpretation None       Radiology Dg Foot Complete Left  Result Date:  06/07/2017 CLINICAL DATA:  Persistent pain and swelling after trip and fall injury on 06/01/2017. EXAM: LEFT FOOT - COMPLETE 3+ VIEW COMPARISON:  None. FINDINGS: Negative for fracture, dislocation or radiopaque foreign body. Moderate first MTP degenerative hypertrophy and hallux valgus. No bone lesion or bony destruction. IMPRESSION: Negative. Electronically Signed   By: Andreas Newport M.D.   On: 06/07/2017 01:07    Procedures Procedures (including critical care time)  Medications Ordered in ED Medications - No data to display   Initial Impression / Assessment and Plan / ED Course  I have reviewed the triage vital signs and the nursing notes.  Pertinent labs & imaging results that were available during my care of the patient were reviewed  by me and considered in my medical decision making (see chart for details).  X-rays are negative for fracture. This will be treated as a sprain. To return as needed/follow-up for any problems.  Final Clinical Impressions(s) / ED Diagnoses   Final diagnoses:  Foot sprain, left, initial encounter    New Prescriptions Discharge Medication List as of 06/07/2017  2:17 AM       Veryl Speak, MD 06/07/17 1224

## 2017-08-24 ENCOUNTER — Encounter: Payer: Self-pay | Admitting: Podiatry

## 2017-08-24 ENCOUNTER — Other Ambulatory Visit: Payer: Self-pay | Admitting: Podiatry

## 2017-08-24 ENCOUNTER — Ambulatory Visit (INDEPENDENT_AMBULATORY_CARE_PROVIDER_SITE_OTHER): Payer: Worker's Compensation

## 2017-08-24 ENCOUNTER — Ambulatory Visit: Payer: BLUE CROSS/BLUE SHIELD | Admitting: Podiatry

## 2017-08-24 DIAGNOSIS — S92302A Fracture of unspecified metatarsal bone(s), left foot, initial encounter for closed fracture: Secondary | ICD-10-CM | POA: Diagnosis not present

## 2017-08-24 DIAGNOSIS — M79672 Pain in left foot: Secondary | ICD-10-CM

## 2017-08-24 NOTE — Progress Notes (Signed)
Patient ID: Benjamin Baker, male   DOB: 17-Jun-1956, 61 y.o.   MRN: [id number]   HPI: 61 year old male with a history of diabetes mellitus presents to the office today for ongoing left foot pain. Patient has been having swelling and pain during ambulation for the past 3 months. 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 He states that during the injury he felt as if his foot was bent backwards. Patient went to the emergency department a [facility] where x-rays were taken and he was informed that they were negative for fracture. Patient continued to physical therapy which did not help and he continues to have pain and swelling. He finally requested to be seen by a specialist and he presents today as a new patient for further treatment and evaluation   Past Medical History:  Diagnosis Date  . CAD (coronary artery disease)    a. 03/2002 - CABG x 3: LIMA > LAD, R Rad > OM2, SVG > PDA.    b. Cath 06/2012 - Significant three-vessel disease, 3/3 grafts patent. LVEF 60-65%   . Diabetes mellitus   . GERD (gastroesophageal reflux disease)   . Hyperlipidemia   . Hypertension   . Kidney stone   . Kidney stone on left side 05/11/2013  . Kidney stones   . NSTEMI (non-ST elevated myocardial infarction) [facility]) September 2013   grafts patent; EF normal; manage medically  . Tobacco abuse    a. ongoing 06/2012     Physical Exam: General: The patient is alert and oriented x3 in no acute distress.  Dermatology: Skin is warm, dry and supple bilateral lower extremities. Negative for open lesions or macerations.  Vascular: Palpable pedal pulses bilaterally.Neurovascular status intact.  Capillary refill within normal limits. There is a significant amount of swelling with some erythema most prominent to the dorsum of the left foot.  Neurological: Epicritic and protective threshold grossly intact bilaterally. Patient does present  with some polyneuropathy and loss of sensation.  Musculoskeletal Exam: Muscle strength 5/5 in all groups bilateral.There is a significant amount of pain on palpation overlying the second and third metatarsals of the right foot. Pain with range of motion also noted. Motion is limited due to edema and swelling.   Radiographic Exam:  Transverse fractures noted to metatarsals 2 and 3 of the left foot at the level of the diaphysis midshaft. Significant displacement noted to the second metatarsal with moderate displacement of the third metatarsal. There is a large amount of callus formation noted which indicates the fracture is chronic in nature.   Assessment: -  Chronic, Closed, displaced, fractures second and third metatarsal left foot with callus formation   Plan of Care:  - Patient was evaluated today. X-rays taken at [facility] on 06/07/2017 were reviewed today. On those x-rays taken there is a subtle transverse fracture visualized to the second metatarsal which was missed on initial exam in the emergency department. - I informed the patient that the fractures from the metatarsals are no doubt related to the injury that occurred on 05/31/2017. I explained to the patient that he has been walking on broken bones in his foot since the injury and that is why he is not improving. - Compression anklet was dispensed today as well as a short immobilization cam boot. The patient went minimal weightbearing to the left lower extremity in the cam boot. I informed the patient that it  is not recommended that he work however if he needs to work can as long his work we will allow him to wear the immobilization cam boot. - Patient will follow up in the clinic in 4 weeks where follow-up x-rays will be performed. -  -     Edrick Kins, DPM [facility]  Dr. Edrick Kins, DPM    [address], [place] [address]                Office [phone]  Fax [phone]

## 2017-08-24 NOTE — Progress Notes (Signed)
Left f

## 2017-08-31 ENCOUNTER — Telehealth: Payer: Self-pay | Admitting: Podiatry

## 2017-08-31 NOTE — Telephone Encounter (Signed)
Raiford Simmonds from [employer] called and spoke to [place]. She is requesting progress notes on pt and is requesting them to be faxed to her attention at [phone].

## 2017-09-21 ENCOUNTER — Ambulatory Visit (INDEPENDENT_AMBULATORY_CARE_PROVIDER_SITE_OTHER): Payer: Worker's Compensation | Admitting: Podiatry

## 2017-09-21 ENCOUNTER — Ambulatory Visit (INDEPENDENT_AMBULATORY_CARE_PROVIDER_SITE_OTHER): Payer: Worker's Compensation

## 2017-09-21 ENCOUNTER — Encounter: Payer: Self-pay | Admitting: Podiatry

## 2017-09-21 DIAGNOSIS — S92302A Fracture of unspecified metatarsal bone(s), left foot, initial encounter for closed fracture: Secondary | ICD-10-CM

## 2017-09-21 DIAGNOSIS — S92302G Fracture of unspecified metatarsal bone(s), left foot, subsequent encounter for fracture with delayed healing: Secondary | ICD-10-CM

## 2017-09-28 ENCOUNTER — Telehealth: Payer: Self-pay | Admitting: Podiatry

## 2017-09-28 NOTE — Progress Notes (Signed)
Patient ID: Benjamin Baker, male   DOB: Feb 06, 1956, 61 y.o.   MRN: [id number]   HPI: 61 year old male with a history of diabetes mellitus presents to the office today for follow-up evaluation of fractures of the second and third metatarsals of the left foot with callus formations.  His initial injury was 05/31/17.  He reports mild improvement of the pain.  He is currently walking without wearing the cam boot around his house.  He does report some associated swelling in the morning time.  Patient is here for further evaluation and treatment.   Past Medical History:  Diagnosis Date  . CAD (coronary artery disease)    a. 03/2002 - CABG x 3: LIMA > LAD, R Rad > OM2, SVG > PDA.    b. Cath 06/2012 - Significant three-vessel disease, 3/3 grafts patent. LVEF 60-65%   . Diabetes mellitus   . GERD (gastroesophageal reflux disease)   . Hyperlipidemia   . Hypertension   . Kidney stone   . Kidney stone on left side 05/11/2013  . Kidney stones   . NSTEMI (non-ST elevated myocardial infarction) [facility]) September 2013   grafts patent; EF normal; manage medically  . Tobacco abuse    a. ongoing 06/2012     Physical Exam: General: The patient is alert and oriented x3 in no acute distress.  Dermatology: Skin is warm, dry and supple bilateral lower extremities. Negative for open lesions or macerations.  Vascular: Palpable pedal pulses bilaterally.Neurovascular status intact.  Capillary refill within normal limits. There is a significant amount of swelling with some erythema most prominent to the dorsum of the left foot.  Neurological: Epicritic and protective threshold grossly intact bilaterally. Patient does present with some polyneuropathy and loss of sensation.  Musculoskeletal Exam: Muscle strength 5/5 in all groups bilateral.There is a significant amount of pain on palpation overlying the second and third metatarsals of the right foot. Pain with range of motion also noted. Motion is limited due to edema and  swelling.   Radiographic Exam:  Transverse fractures noted to metatarsals 2 and 3 of the left foot at the level of the diaphysis midshaft with slight improvement. Significant displacement noted to the second metatarsal with moderate displacement of the third metatarsal. There is a large amount of callus formation noted which indicates the fracture is chronic in nature.   Assessment: -  Chronic, Closed, displaced, fractures second and third metatarsal left foot with callus formation   Plan of Care:  - Patient was evaluated today. X-rays reviewed. -Continue weightbearing in cam boot. -Continue wearing compression anklet. -Refrain from work for an additional 4 weeks minimum. -Return to clinic in 4 weeks.  Edrick Kins, DPM [facility]  Dr. Edrick Kins, DPM    [address], [place] [address]                Office [phone]  Fax [phone]

## 2017-09-28 NOTE — Telephone Encounter (Signed)
Benjamin Baker,  Pts workers comp IT consultant ([employer]) called to verify when pt can go back to work. They need notes and record of when pt can return back to work faxed to [phone]. The best call back number is [phone]. Claim # is [id number]  Thanks! Domenick Gong

## 2017-09-28 NOTE — Telephone Encounter (Signed)
Dr. Amalia Hailey, can you please dictate pt's note from last week and document when he can return to work so that I can send over the requested information. Thanks.

## 2017-09-29 NOTE — Telephone Encounter (Signed)
Will go ahead and get everything sent to [employer] and let them know pt's work status as well.

## 2017-10-19 ENCOUNTER — Encounter: Payer: Self-pay | Admitting: Podiatry

## 2017-10-19 ENCOUNTER — Ambulatory Visit (INDEPENDENT_AMBULATORY_CARE_PROVIDER_SITE_OTHER): Payer: Worker's Compensation | Admitting: Podiatry

## 2017-10-19 ENCOUNTER — Ambulatory Visit (INDEPENDENT_AMBULATORY_CARE_PROVIDER_SITE_OTHER): Payer: Worker's Compensation

## 2017-10-19 DIAGNOSIS — S92302G Fracture of unspecified metatarsal bone(s), left foot, subsequent encounter for fracture with delayed healing: Secondary | ICD-10-CM

## 2017-10-23 NOTE — Progress Notes (Signed)
Patient ID: Benjamin Baker, male   DOB: 29-Jul-1956, 62 y.o.   MRN: [id number]   HPI: 62 year old male with a history of diabetes mellitus presents to the office today for follow-up evaluation of fractures of the second and third metatarsals of the left foot with callus formations.  His initial injury was 05/31/17. He reports some continued swelling in the morning time but denies any pain. He has been wearing the compression anklet as directed but believes it is too tight. Patient is here for further evaluation and treatment.   Past Medical History:  Diagnosis Date  . CAD (coronary artery disease)    a. 03/2002 - CABG x 3: LIMA > LAD, R Rad > OM2, SVG > PDA.    b. Cath 06/2012 - Significant three-vessel disease, 3/3 grafts patent. LVEF 60-65%   . Diabetes mellitus   . GERD (gastroesophageal reflux disease)   . Hyperlipidemia   . Hypertension   . Kidney stone   . Kidney stone on left side 05/11/2013  . Kidney stones   . NSTEMI (non-ST elevated myocardial infarction) [facility]) September 2013   grafts patent; EF normal; manage medically  . Tobacco abuse    a. ongoing 06/2012     Physical Exam: General: The patient is alert and oriented x3 in no acute distress.  Dermatology: Skin is warm, dry and supple bilateral lower extremities. Negative for open lesions or macerations.  Vascular: Palpable pedal pulses bilaterally.Neurovascular status intact.  Capillary refill within normal limits. There is a significant amount of swelling with some erythema most prominent to the dorsum of the left foot.  Neurological: Epicritic and protective threshold grossly intact bilaterally. Patient does present with some polyneuropathy and loss of sensation.  Musculoskeletal Exam: Muscle strength 5/5 in all groups bilateral. Pain on palpation overlying the second and third metatarsals of the right foot. Pain with range of motion also noted. Motion is limited due to edema and swelling.   Radiographic Exam:  Transverse  fractures noted to metatarsals 2 and 3 of the left foot at the level of the diaphysis midshaft with routine healing. Significant displacement noted to the second metatarsal with moderate displacement of the third metatarsal with routine helaing. There is a large amount of callus formation noted which indicates the fracture is chronic in nature.   Assessment: - Chronic, Closed, displaced, fractures second and third metatarsal left foot with callus formation with routine healing   Plan of Care:  - Patient was evaluated today. X-rays reviewed. - Continue weightbearing in cam boot. - Continue wearing compression anklet. - Patient may return to work weightbearing in CAM boot. - Return to clinic in 4 weeks for follow up X-Ray.  Edrick Kins, DPM [facility]  Dr. Edrick Kins, DPM    [address], [place] [address]                Office [phone]  Fax [phone]

## 2017-10-24 ENCOUNTER — Telehealth: Payer: Self-pay | Admitting: *Deleted

## 2017-10-24 ENCOUNTER — Telehealth: Payer: Self-pay | Admitting: Podiatry

## 2017-10-24 NOTE — Telephone Encounter (Signed)
Voice male states they are looking for LOV 10/19/2017 and work status for pt Reference # [id number], please fax to [phone].

## 2017-10-24 NOTE — Telephone Encounter (Signed)
This is Raiford Simmonds calling from [employer]. I'm handling Benjamin Baker workers' compensation claim. He was seen on 16 January and I need to get a copy of the medical note and work status. That can be faxed to [phone] attention his claim number which is [id number]. If there are any questions, feel free to call our office at [phone]. Thank you.

## 2017-11-16 ENCOUNTER — Encounter: Payer: Self-pay | Admitting: Podiatry

## 2017-11-16 ENCOUNTER — Ambulatory Visit (INDEPENDENT_AMBULATORY_CARE_PROVIDER_SITE_OTHER): Payer: Worker's Compensation

## 2017-11-16 ENCOUNTER — Telehealth: Payer: Self-pay | Admitting: *Deleted

## 2017-11-16 ENCOUNTER — Ambulatory Visit (INDEPENDENT_AMBULATORY_CARE_PROVIDER_SITE_OTHER): Payer: Worker's Compensation | Admitting: Podiatry

## 2017-11-16 DIAGNOSIS — S92902G Unspecified fracture of left foot, subsequent encounter for fracture with delayed healing: Secondary | ICD-10-CM | POA: Diagnosis not present

## 2017-11-16 DIAGNOSIS — M779 Enthesopathy, unspecified: Secondary | ICD-10-CM

## 2017-11-16 DIAGNOSIS — M778 Other enthesopathies, not elsewhere classified: Secondary | ICD-10-CM

## 2017-11-16 DIAGNOSIS — M7752 Other enthesopathy of left foot: Secondary | ICD-10-CM

## 2017-11-16 DIAGNOSIS — S92302K Fracture of unspecified metatarsal bone(s), left foot, subsequent encounter for fracture with nonunion: Secondary | ICD-10-CM

## 2017-11-16 NOTE — Telephone Encounter (Signed)
Left message informing Lytle Michaels - [employer], that Dr. Amalia Hailey was ordering for a Worker's comp pt.

## 2017-11-16 NOTE — Telephone Encounter (Signed)
-----   Message from Edrick Kins, DPM sent at 11/16/2017  1:41 PM EST ----- Regarding: [employer] bone stimulator Can we get this patient approved for an [employer] Bone Stimulator?  This is a workers comp patient  Diagnosis: Metatarsal fractures 2,3 left foot with nonunion.   Thanks, Dr. Amalia Hailey

## 2017-11-18 ENCOUNTER — Telehealth: Payer: Self-pay | Admitting: Podiatry

## 2017-11-18 NOTE — Telephone Encounter (Signed)
Benjamin Baker picked up the available clinicals and demographics with Worker's comp information.

## 2017-11-18 NOTE — Telephone Encounter (Signed)
Left message for Benjamin Baker to call me to see if we can start the process to get the pt some custom orthotics.

## 2017-11-21 NOTE — Progress Notes (Signed)
Patient ID: Benjamin Baker, male   DOB: 1956/04/12, 62 y.o.   MRN: [id number]   HPI: 62 year old male with a history of diabetes mellitus presents to the office today for follow-up evaluation of fractures of the second and third metatarsals of the left foot with callus formations.  His initial injury was 05/31/17.  Patient states that he continues to feel pain and tenderness to the metatarsals overlying the left foot.  There is been no improvement of symptoms despite immobilization in a cam boot.  The patient has now had these fractures for approximately 6 months.  He presents today for further treatment and evaluation  Past Medical History:  Diagnosis Date  . CAD (coronary artery disease)    a. 03/2002 - CABG x 3: LIMA > LAD, R Rad > OM2, SVG > PDA.    b. Cath 06/2012 - Significant three-vessel disease, 3/3 grafts patent. LVEF 60-65%   . Diabetes mellitus   . GERD (gastroesophageal reflux disease)   . Hyperlipidemia   . Hypertension   . Kidney stone   . Kidney stone on left side 05/11/2013  . Kidney stones   . NSTEMI (non-ST elevated myocardial infarction) [facility]) September 2013   grafts patent; EF normal; manage medically  . Tobacco abuse    a. ongoing 06/2012     Physical Exam: General: The patient is alert and oriented x3 in no acute distress.  Dermatology: Skin is warm, dry and supple bilateral lower extremities. Negative for open lesions or macerations.  Vascular: Palpable pedal pulses bilaterally.Neurovascular status intact.  Capillary refill within normal limits. There is a significant amount of swelling with some erythema most prominent to the dorsum of the left foot.  Neurological: Epicritic and protective threshold grossly intact bilaterally. Patient does present with some polyneuropathy and loss of sensation.  Musculoskeletal Exam: Muscle strength 5/5 in all groups bilateral. Pain on palpation overlying the second and third metatarsals of the right foot. Pain with range of motion  also noted. Motion is limited due to edema and swelling.   Radiographic Exam:  Transverse fractures noted to metatarsals 2 and 3 of the left foot at the level of the diaphysis midshaft with routine healing. Significant displacement noted to the second metatarsal with moderate displacement of the third metatarsal with routine helaing. There is a large amount of callus formation however there appears to be nonunion to the diaphysis of the fracture sites despite the periwound callus.  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 X-rays reviewed. -Today we are going to consider the patient's fractures and nonunion.  Patient is failing all conservative modalities and satisfactory alleviation of the fractures for the patient.  Today we are going to place an order for a bone stimulator, [employer], due to the nonunion.  This will be medically necessary as the patient is failed conservative treatment for 6 months -Continue immobilization in the cam boot -Return to clinic in 4 weeks  Edrick Kins, DPM [facility]  Dr. Edrick Kins, DPM    [address] [place] [address]                Office [phone]  Fax [phone]

## 2017-11-22 ENCOUNTER — Telehealth: Payer: Self-pay | Admitting: *Deleted

## 2017-11-22 NOTE — Telephone Encounter (Signed)
[place] request last chart notes on pt [id number].

## 2017-11-23 NOTE — Telephone Encounter (Signed)
This was already taken care of and faxed.

## 2017-11-24 ENCOUNTER — Telehealth: Payer: Self-pay | Admitting: Podiatry

## 2017-11-24 NOTE — Telephone Encounter (Signed)
I will contact Benjamin Baker to get the process of the bone stimulator started. Did you want inserts for this patient?

## 2017-11-24 NOTE — Telephone Encounter (Signed)
I was in and saw Dr. Amalia Hailey last week. He said he was going to check on some inserts for my shoes and some type of therapy. I have not heard anything back so if somebody could please call me back at [phone]. Thank you.

## 2017-11-24 NOTE — Telephone Encounter (Signed)
Yes, I'll send a message to [facility] to see if his ins covers custom orthotics.  Thanks, Dr. Amalia Hailey

## 2017-11-24 NOTE — Telephone Encounter (Signed)
Spoke with patient in forming him that we would check his insurance coverage and get back in touch with him

## 2017-12-14 ENCOUNTER — Ambulatory Visit (INDEPENDENT_AMBULATORY_CARE_PROVIDER_SITE_OTHER): Payer: Worker's Compensation | Admitting: Podiatry

## 2017-12-14 ENCOUNTER — Ambulatory Visit (INDEPENDENT_AMBULATORY_CARE_PROVIDER_SITE_OTHER): Payer: Worker's Compensation

## 2017-12-14 DIAGNOSIS — S92902G Unspecified fracture of left foot, subsequent encounter for fracture with delayed healing: Secondary | ICD-10-CM

## 2017-12-14 NOTE — Progress Notes (Signed)
Patient ID: Benjamin Baker, male   DOB: 1956-01-21, 62 y.o.   MRN: [id number]   HPI: 62 year old male with a history of diabetes mellitus presents to the office today for follow-up evaluation of fractures of the second and third metatarsals of the left foot with callus formations.  His initial injury was 05/31/17.  Patient states that over the past 4 weeks there has been some improvement in reduction of pain.  He has been wearing the immobilization cam boot for the past 4 weeks.  He just recently received his exigent bone stimulator to stimulate bone healing.  He stated today that he recently got an attorney in regards to his Worker's Compensation case.  Past Medical History:  Diagnosis Date  . CAD (coronary artery disease)    a. 03/2002 - CABG x 3: LIMA > LAD, R Rad > OM2, SVG > PDA.    b. Cath 06/2012 - Significant three-vessel disease, 3/3 grafts patent. LVEF 60-65%   . Diabetes mellitus   . GERD (gastroesophageal reflux disease)   . Hyperlipidemia   . Hypertension   . Kidney stone   . Kidney stone on left side 05/11/2013  . Kidney stones   . NSTEMI (non-ST elevated myocardial infarction) [facility]) September 2013   grafts patent; EF normal; manage medically  . Tobacco abuse    a. ongoing 06/2012     Physical Exam: General: The patient is alert and oriented x3 in no acute distress.  Dermatology: Skin is warm, dry and supple bilateral lower extremities. Negative for open lesions or macerations.  Vascular: Palpable pedal pulses bilaterally.Neurovascular status intact.  Capillary refill within normal limits. There is a significant amount of swelling with some erythema most prominent to the dorsum of the left foot.  Neurological: Epicritic and protective threshold grossly intact bilaterally. Patient does present with some polyneuropathy and loss of sensation.  Musculoskeletal Exam: Muscle strength 5/5 in all groups bilateral. Pain on palpation overlying the second and third metatarsals of the  right foot. Pain with range of motion also noted.   Radiographic Exam:  Transverse fractures noted to metatarsals 2 and 3 of the left foot at the level of the diaphysis midshaft with a significant amount of callus formation around the fracture sites..  Dorsal displacement of the second metatarsal head in relationship to the diaphysis with moderate displacement of the third metatarsal with bony callus formation. There is a large amount of callus formation however there appears to be nonunion to the diaphysis of the fracture sites despite the periwound callus.  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 X-rays reviewed. -Continue the application of the excision bone stimulator.  I explained the patient this may be a modality of treatment for the next several months or at least until symptoms resolved, and pain subsides -I explained to the patient today that the objective goal is to get the fracture sites to heal 100%.  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  The patient may benefit from employment that offers more of a sedentary position and is not so strenuous on his feet.  -Recommend that over the next 4 weeks to begin to transition out of the  immobilization cam boot to good supportive sneakers. -Custom molded orthotics are still pending.  Recommend custom molded insoles to help support the arch and the fracture fragments.  This would alleviate a significant amount of pain and tenderness and pressure from the fracture sites. -Return to clinic in 4 weeks  Edrick Kins, DPM [facility]  Dr. Edrick Kins, DPM    [address], [place] [address]                Office [phone]  Fax [phone]

## 2017-12-15 ENCOUNTER — Telehealth: Payer: Self-pay | Admitting: Podiatry

## 2017-12-15 NOTE — Telephone Encounter (Signed)
Called and spoke with Richardson Landry at [employer]. I told him I had just faxed requested records to who I presume it pt's case manager, Raiford Simmonds at fax number [phone] from his office visit yesterday 14 December 2017. I told Richardson Landry that anything about pt's work status should be in the office note.

## 2017-12-15 NOTE — Telephone Encounter (Signed)
This is Thurmond Butts calling on behalf of [employer]. I would like to obtain the office visit notes and work status from date of service 14 December 2017. If you have this information ready, please call us back at [phone] and reference the claim number [id number] so anyone on the team can help you. Thank you.

## 2018-01-11 ENCOUNTER — Ambulatory Visit (INDEPENDENT_AMBULATORY_CARE_PROVIDER_SITE_OTHER): Payer: Worker's Compensation

## 2018-01-11 ENCOUNTER — Encounter: Payer: Self-pay | Admitting: Podiatry

## 2018-01-11 ENCOUNTER — Ambulatory Visit (INDEPENDENT_AMBULATORY_CARE_PROVIDER_SITE_OTHER): Payer: Worker's Compensation | Admitting: Podiatry

## 2018-01-11 ENCOUNTER — Telehealth: Payer: Self-pay | Admitting: Podiatry

## 2018-01-11 DIAGNOSIS — S92902G Unspecified fracture of left foot, subsequent encounter for fracture with delayed healing: Secondary | ICD-10-CM

## 2018-01-11 DIAGNOSIS — M778 Other enthesopathies, not elsewhere classified: Secondary | ICD-10-CM

## 2018-01-11 DIAGNOSIS — M779 Enthesopathy, unspecified: Secondary | ICD-10-CM | POA: Diagnosis not present

## 2018-01-11 MED ORDER — GABAPENTIN 100 MG PO CAPS: 100.0000 mg | ORAL_CAPSULE | Freq: Three times a day (TID) | ORAL | 3 refills | Status: AC

## 2018-01-11 NOTE — Telephone Encounter (Signed)
Hi, this is [place] with [employer]. Calling to see if I can today's office visit notes on Mr. Benjamin Baker as well as his work status. If you could fax the information to [phone] and if you could include his claim number which is [id number]. If you have any questions, please give me a call back at [phone]. Thank you.

## 2018-01-13 ENCOUNTER — Telehealth: Payer: Self-pay | Admitting: Podiatry

## 2018-01-13 NOTE — Telephone Encounter (Signed)
This is Benjamin Baker with [employer] calling in regards to Benjamin Baker. Just calling to see if the notes from the 10 April were available. If so, could you please fax those to me at [phone]. If you have any questions, please give me a call back at [phone]. Thank you.

## 2018-01-13 NOTE — Telephone Encounter (Signed)
Called and left a voicemail message for Marianna Fuss letting her know that Mr. Homeyer office visit notes from date of service 10 April have not yet been dictated. Once dictated I would fax the records to her in regards to pt's claim number of [id number]. Told Kerri to call me directly with any questions at [phone].

## 2018-01-16 NOTE — Progress Notes (Signed)
Patient ID: Benjamin Baker, male   DOB: [birth year], 62 y.o.   MRN: [id number]   HPI: 62 year old male with a history of diabetes mellitus presents to the office today for follow-up evaluation of fractures of the second and third metatarsals of the left foot with callus formations. His initial injury was 05/31/17. He reports improvement in the pain but still has some continued intermittent swelling and numbness. He is still currently using the bone stimulator. He states his insurance will not cover custom molded orthotics. Patient is here for further evaluation and treatment.   Past Medical History:  Diagnosis Date  . CAD (coronary artery disease)    a. 03/2002 - CABG x 3: LIMA > LAD, R Rad > OM2, SVG > PDA.    b. Cath 06/2012 - Significant three-vessel disease, 3/3 grafts patent. LVEF 60-65%   . Diabetes mellitus   . GERD (gastroesophageal reflux disease)   . Hyperlipidemia   . Hypertension   . Kidney stone   . Kidney stone on left side 05/11/2013  . Kidney stones   . NSTEMI (non-ST elevated myocardial infarction) [facility]) September 2013   grafts patent; EF normal; manage medically  . Tobacco abuse    a. ongoing 06/2012     Physical Exam: General: The patient is alert and oriented x3 in no acute distress.  Dermatology: Skin is warm, dry and supple bilateral lower extremities. Negative for open lesions or macerations.  Vascular: Palpable pedal pulses bilaterally.Neurovascular status intact.  Capillary refill within normal limits. There is a significant amount of swelling with some erythema most prominent to the dorsum of the left foot.  Neurological: Epicritic and protective threshold grossly intact bilaterally. Patient does present with some polyneuropathy and loss of sensation. Paresthesia with burning, shooting pain noted to the left forefoot.  Musculoskeletal Exam: Muscle strength 5/5 in all groups bilateral. Pain on palpation overlying the second and third metatarsals of the right foot.  Pain with range of motion also noted.   Radiographic Exam:  Transverse fractures noted to metatarsals 2 and 3 of the left foot at the level of the diaphysis midshaft with a significant amount of callus formation around the fracture sites..  Dorsal displacement of the second metatarsal head in relationship to the diaphysis with moderate displacement of the third metatarsal with bony callus formation. There is a large amount of callus formation however there appears to be nonunion to the diaphysis of the fracture sites despite the periwound callus.  Assessment: 1. 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 Neuritis left forefoot.   Plan of Care:  1. Patient evaluated. X-Rays reviewed.  2. Prescription for gabapentin 100 mg three times daily provided to patient.  3. Continue weightbearing in good sneakers.  4. Orders placed today for physical therapy functional capacity evaluation.  5. Return to clinic in 6 weeks.   Edrick Kins, DPM [facility]  Dr. Edrick Kins, DPM    [address], [place] [address]                Office [phone]  Fax [phone]

## 2018-01-30 ENCOUNTER — Other Ambulatory Visit: Payer: Self-pay

## 2018-01-30 ENCOUNTER — Emergency Department (HOSPITAL_COMMUNITY)
Admission: EM | Admit: 2018-01-30 | Discharge: 2018-01-31 | Disposition: A | Discharge status: Home / Self Care | Payer: Self-pay | Attending: Emergency Medicine | Admitting: Emergency Medicine

## 2018-01-30 ENCOUNTER — Emergency Department (HOSPITAL_COMMUNITY): Payer: Self-pay

## 2018-01-30 ENCOUNTER — Encounter (HOSPITAL_COMMUNITY): Payer: Self-pay | Admitting: *Deleted

## 2018-01-30 DIAGNOSIS — Z794 Long term (current) use of insulin: Secondary | ICD-10-CM | POA: Insufficient documentation

## 2018-01-30 DIAGNOSIS — I2510 Atherosclerotic heart disease of native coronary artery without angina pectoris: Secondary | ICD-10-CM | POA: Insufficient documentation

## 2018-01-30 DIAGNOSIS — F17210 Nicotine dependence, cigarettes, uncomplicated: Secondary | ICD-10-CM | POA: Insufficient documentation

## 2018-01-30 DIAGNOSIS — E1165 Type 2 diabetes mellitus with hyperglycemia: Secondary | ICD-10-CM | POA: Insufficient documentation

## 2018-01-30 DIAGNOSIS — R739 Hyperglycemia, unspecified: Secondary | ICD-10-CM

## 2018-01-30 DIAGNOSIS — I10 Essential (primary) hypertension: Secondary | ICD-10-CM | POA: Insufficient documentation

## 2018-01-30 DIAGNOSIS — R059 Cough, unspecified: Secondary | ICD-10-CM

## 2018-01-30 DIAGNOSIS — R05 Cough: Secondary | ICD-10-CM | POA: Insufficient documentation

## 2018-01-30 DIAGNOSIS — I252 Old myocardial infarction: Secondary | ICD-10-CM | POA: Insufficient documentation

## 2018-01-30 DIAGNOSIS — Z79899 Other long term (current) drug therapy: Secondary | ICD-10-CM | POA: Insufficient documentation

## 2018-01-30 DIAGNOSIS — R509 Fever, unspecified: Secondary | ICD-10-CM | POA: Insufficient documentation

## 2018-01-30 DIAGNOSIS — Z951 Presence of aortocoronary bypass graft: Secondary | ICD-10-CM | POA: Insufficient documentation

## 2018-01-30 LAB — BASIC METABOLIC PANEL
Anion gap: 6 (ref 5–15)
BUN: 6 mg/dL (ref 6–20)
CHLORIDE: 104 mmol/L (ref 101–111)
CO2: 27 mmol/L (ref 22–32)
CREATININE: 0.97 mg/dL (ref 0.61–1.24)
Calcium: 8.6 mg/dL — ABNORMAL LOW (ref 8.9–10.3)
GFR calc non Af Amer: >60 mL/min (ref >60)
GLUCOSE: 321 mg/dL — AB (ref 65–99)
Potassium: 3.8 mmol/L (ref 3.5–5.1)
Sodium: 137 mmol/L (ref 135–145)

## 2018-01-30 LAB — CBC
HCT: 45 % (ref 39.0–52.0)
Hemoglobin: 14.7 g/dL (ref 13.0–17.0)
MCH: 29.8 pg (ref 26.0–34.0)
MCHC: 32.7 g/dL (ref 30.0–36.0)
MCV: 91.1 fL (ref 78.0–100.0)
Platelets: 226 10*3/uL (ref 150–400)
RBC: 4.94 MIL/uL (ref 4.22–5.81)
RDW: 13.7 % (ref 11.5–15.5)
WBC: 12.6 10*3/uL — ABNORMAL HIGH (ref 4.0–10.5)

## 2018-01-30 LAB — CBG MONITORING, ED: GLUCOSE-CAPILLARY: 321 mg/dL — AB (ref 65–99)

## 2018-01-30 MED ORDER — ACETAMINOPHEN 325 MG PO TABS
650.0000 mg | ORAL_TABLET | Freq: Once | ORAL | Status: AC | PRN
  Administered 2018-01-30: 650 mg via ORAL
  Filled 2018-01-30: qty 2

## 2018-01-30 NOTE — ED Triage Notes (Signed)
Pt c/o non productive cough, congestion, and fever since Friday. Pt also reports hyperglycemia since becoming sick. Pt is on metformin and insulin.

## 2018-01-31 LAB — CBG MONITORING, ED: GLUCOSE-CAPILLARY: 253 mg/dL — AB (ref 65–99)

## 2018-01-31 MED ORDER — DOXYCYCLINE HYCLATE 100 MG PO CAPS: 100.0000 mg | ORAL_CAPSULE | Freq: Two times a day (BID) | ORAL | 0 refills | Status: DC

## 2018-01-31 MED ORDER — DOXYCYCLINE HYCLATE 100 MG PO TABS
100.0000 mg | ORAL_TABLET | Freq: Once | ORAL | Status: AC
  Administered 2018-01-31: 100 mg via ORAL
  Filled 2018-01-31: qty 1

## 2018-01-31 MED ORDER — BENZONATATE 100 MG PO CAPS: 200.0000 mg | ORAL_CAPSULE | Freq: Two times a day (BID) | ORAL | 0 refills | Status: DC | PRN

## 2018-01-31 NOTE — ED Provider Notes (Signed)
[facility] EMERGENCY DEPARTMENT Provider Note   CSN: [id number] Arrival date & time: 01/30/18  1931     History   Chief Complaint Chief Complaint  Patient presents with  . Cough  . Hyperglycemia    HPI Benjamin Baker is a 62 y.o. male.  Patient presents to the emergency department with chief complaint of cough and fever.  He states his symptoms started on Saturday, about 3 days ago.  He has not taken anything for his symptoms.  He also reports that his blood sugar has been running high.  He denies any sore throat.  Denies productive cough.  Denies any dysuria or abdominal pain.  He denies any other associated symptoms.  The history is provided by the patient. No language interpreter was used.    Past Medical History:  Diagnosis Date  . CAD (coronary artery disease)    a. 03/2002 - CABG x 3: LIMA > LAD, R Rad > OM2, SVG > PDA.    b. Cath 06/2012 - Significant three-vessel disease, 3/3 grafts patent. LVEF 60-65%   . Diabetes mellitus   . GERD (gastroesophageal reflux disease)   . Hyperlipidemia   . Hypertension   . Kidney stone   . Kidney stone on left side 05/11/2013  . Kidney stones   . NSTEMI (non-ST elevated myocardial infarction) [facility]) September 2013   grafts patent; EF normal; manage medically  . Tobacco abuse    a. ongoing 06/2012    Patient Active Problem List   Diagnosis Date Noted  . DVT, lower extremity ([place]) 05/29/2014  . Non-proliferative diabetic retinopathy, mild, left eye ([place]) 05/05/2014  . Seasonal allergies 03/06/2014  . Acute kidney injury ([place]) 05/12/2013  . Nephrolithiasis 05/11/2013  . NSTEMI (non-ST elevated myocardial infarction) ([place]) 06/29/2012  . Claudication ([place]) 06/28/2012  . Preventative health care 08/29/2011  . DIZZINESS 10/24/2007  . DYSLIPIDEMIA 09/24/2006  . TOBACCO ABUSE 09/24/2006  . CATARACTS 09/24/2006  . HYPERTENSION 09/24/2006  . GERD 09/24/2006  . CORONARY ARTERY BYPASS GRAFT, THREE VESSEL, HX OF  09/24/2006  . DIABETES MELLITUS, TYPE II 10/04/2001    Past Surgical History:  Procedure Laterality Date  . CORONARY ARTERY BYPASS GRAFT  03/16/2002   a) x3: LIMA-LAD, R rad-Cx branch, SVG-PDA   . CYSTOSCOPY WITH URETEROSCOPY AND STENT PLACEMENT Left 05/12/2013   Procedure: CYSTOSCOPY WITH URETEROSCOPY AND STENT PLACEMENT;  Surgeon: Malka So, MD;  Location: [facility] ORS;  Service: Urology;  Laterality: Left;  . HOLMIUM LASER APPLICATION Left 11/10/8339   Procedure: HOLMIUM LASER APPLICATION;  Surgeon: Malka So, MD;  Location: [facility] ORS;  Service: Urology;  Laterality: Left;  . LEFT HEART CATHETERIZATION WITH CORONARY/GRAFT ANGIOGRAM N/A 06/28/2012   Procedure: LEFT HEART CATHETERIZATION WITH Beatrix Fetters;  Surgeon: Sherren Mocha, MD;  Location: [facility] CATH LAB;  Service: Cardiovascular;  Laterality: N/A;        Home Medications    Prior to Admission medications   Medication Sig Start Date End Date Taking? Authorizing Provider  benzonatate (TESSALON) 100 MG capsule Take 2 capsules (200 mg total) by mouth 2 (two) times daily as needed for cough. 01/31/18   Montine Circle, PA-C  CONTOUR NEXT TEST test strip  08/12/17   [provider]  doxycycline (VIBRAMYCIN) 100 MG capsule Take 1 capsule (100 mg total) by mouth 2 (two) times daily. 01/31/18   Montine Circle, PA-C  gabapentin (NEURONTIN) 100 MG capsule Take 1 capsule (100 mg total) by mouth 3 (three) times daily. 01/11/18  Edrick Kins, DPM  insulin NPH-regular Human (NOVOLIN 70/30) (70-30) 100 UNIT/ML injection Inject 45 units into the skin every morning with breakfast and inject 45 units into the skin every evening with supper 12/23/14   Dellia Nims, MD  lisinopril (PRINIVIL,ZESTRIL) 5 MG tablet  08/04/17   [provider]  metFORMIN (GLUCOPHAGE) 1000 MG tablet Take 1 tablet (1,000 mg total) by mouth 2 (two) times daily with a meal. 10/08/14   Dellia Nims, MD    Family History Family History  Problem Relation  Age of Onset  . Insulin resistance Mother 17  . Colon cancer Father 86  . Heart attack Father 80  . Stomach cancer Neg Hx     Social History Social History   Tobacco Use  . Smoking status: Current Every Day Smoker    Packs/day: 0.50    Years: 30.00    Pack years: 15.00    Types: Cigarettes  . Smokeless tobacco: Never Used  Substance Use Topics  . Alcohol use: No  . Drug use: No     Allergies   Patient has no known allergies.   Review of Systems Review of Systems  All other systems reviewed and are negative.    Physical Exam Updated Vital Signs BP (!) 156/90 (BP Location: Right Arm)   Pulse 87   Temp (!) 100.5 F (38.1 C) (Oral)   Resp 20   SpO2 99%   Physical Exam  Constitutional: He is oriented to person, place, and time. He appears well-developed and well-nourished.  HENT:  Head: Normocephalic and atraumatic.  Eyes: Pupils are equal, round, and reactive to light. Conjunctivae and EOM are normal. Right eye exhibits no discharge. Left eye exhibits no discharge. No scleral icterus.  Neck: Normal range of motion. Neck supple. No JVD present.  Cardiovascular: Normal rate, regular rhythm and normal heart sounds. Exam reveals no gallop and no friction rub.  No murmur heard. Pulmonary/Chest: Effort normal and breath sounds normal. No respiratory distress. He has no wheezes. He has no rales. He exhibits no tenderness.  Abdominal: Soft. He exhibits no distension and no mass. There is no tenderness. There is no rebound and no guarding.  Musculoskeletal: Normal range of motion. He exhibits no edema or tenderness.  Neurological: He is alert and oriented to person, place, and time.  Skin: Skin is warm and dry.  Psychiatric: He has a normal mood and affect. His behavior is normal. Judgment and thought content normal.  Nursing note and vitals reviewed.    ED Treatments / Results  Labs (all labs ordered are listed, but only abnormal results are displayed) Labs Reviewed   BASIC METABOLIC PANEL - Abnormal; Notable for the following components:      Result Value   Glucose, Bld 321 (*)    Calcium 8.6 (*)    All other components within normal limits  CBC - Abnormal; Notable for the following components:   WBC 12.6 (*)    All other components within normal limits  CBG MONITORING, ED - Abnormal; Notable for the following components:   Glucose-Capillary 321 (*)    All other components within normal limits  CBG MONITORING, ED - Abnormal; Notable for the following components:   Glucose-Capillary 253 (*)    All other components within normal limits    EKG None  Radiology Dg Chest 2 View  Result Date: 01/30/2018 CLINICAL DATA:  Nonproductive cough.  Congestion. EXAM: CHEST - 2 VIEW COMPARISON:  November 23, 2015 FINDINGS: Density over the  left first costochondral junction is likely degenerative change and overlapping structures, unchanged since 2016. No pneumothorax. The heart, hila, mediastinum, lungs, and pleura are otherwise normal. IMPRESSION: No active cardiopulmonary disease. Electronically Signed   By: Dorise Bullion III M.D   On: 01/30/2018 22:12    Procedures Procedures (including critical care time)  Medications Ordered in ED Medications  doxycycline (VIBRA-TABS) tablet 100 mg (has no administration in time range)  acetaminophen (TYLENOL) tablet 650 mg (650 mg Oral Given 01/30/18 2143)     Initial Impression / Assessment and Plan / ED Course  I have reviewed the triage vital signs and the nursing notes.  Pertinent labs & imaging results that were available during my care of the patient were reviewed by me and considered in my medical decision making (see chart for details).     Patient with cough and fever.  He is not hypoxic nor tachycardic.  He is a smoker, but has never been diagnosed with COPD.  His white blood cell count is elevated at 12.6.  Glucose has been trending down in the emergency department.  Chest x-ray is clear.  He may  benefit from antibiotic therapy.  Will give doxycycline.  Final Clinical Impressions(s) / ED Diagnoses   Final diagnoses:  Cough  Hyperglycemia    ED Discharge Orders        Ordered    doxycycline (VIBRAMYCIN) 100 MG capsule  2 times daily     01/31/18 0302    benzonatate (TESSALON) 100 MG capsule  2 times daily PRN     01/31/18 0302       Montine Circle, PA-C 01/31/18 7672    Ripley Fraise, MD 01/31/18 [phone]

## 2018-01-31 NOTE — Discharge Instructions (Addendum)
Your chest x-ray is normal today, however, given that you have had a fever and cough, you may need to take antibiotic.

## 2018-02-20 ENCOUNTER — Other Ambulatory Visit: Payer: Self-pay

## 2018-02-20 ENCOUNTER — Ambulatory Visit (INDEPENDENT_AMBULATORY_CARE_PROVIDER_SITE_OTHER): Payer: Worker's Compensation

## 2018-02-20 ENCOUNTER — Ambulatory Visit (INDEPENDENT_AMBULATORY_CARE_PROVIDER_SITE_OTHER): Payer: Worker's Compensation | Admitting: Podiatry

## 2018-02-20 DIAGNOSIS — E119 Type 2 diabetes mellitus without complications: Secondary | ICD-10-CM | POA: Insufficient documentation

## 2018-02-20 DIAGNOSIS — I2510 Atherosclerotic heart disease of native coronary artery without angina pectoris: Secondary | ICD-10-CM | POA: Insufficient documentation

## 2018-02-20 DIAGNOSIS — S92902G Unspecified fracture of left foot, subsequent encounter for fracture with delayed healing: Secondary | ICD-10-CM

## 2018-02-22 ENCOUNTER — Telehealth: Payer: Self-pay | Admitting: *Deleted

## 2018-02-22 ENCOUNTER — Ambulatory Visit: Payer: Worker's Compensation | Admitting: Podiatry

## 2018-02-22 NOTE — Telephone Encounter (Signed)
Benjamin Baker - [employer] states pt's CLAIM# [id number], ADJUSTER:  [employer], NURSE CASE MGRVernell Barrier [phone], PT referral and clinicals should be faxed.

## 2018-02-23 ENCOUNTER — Telehealth: Payer: Self-pay | Admitting: Podiatry

## 2018-02-23 NOTE — Progress Notes (Signed)
Patient ID: Benjamin Baker, male   DOB: [birth year], 62 y.o.   MRN: [id number]   HPI: 62 year old male with a history of diabetes mellitus presents to the office today for follow-up evaluation of fractures of the second and third metatarsals of the left foot with callus formations. His initial injury was 05/31/17. He reports burning pain after his physical therapy evaluation. Patient is here for further evaluation and treatment.    Past Medical History:  Diagnosis Date  . CAD (coronary artery disease)    a. 03/2002 - CABG x 3: LIMA > LAD, R Rad > OM2, SVG > PDA.    b. Cath 06/2012 - Significant three-vessel disease, 3/3 grafts patent. LVEF 60-65%   . Diabetes mellitus   . GERD (gastroesophageal reflux disease)   . Hyperlipidemia   . Hypertension   . Kidney stone   . Kidney stone on left side 05/11/2013  . Kidney stones   . NSTEMI (non-ST elevated myocardial infarction) [facility]) September 2013   grafts patent; EF normal; manage medically  . Tobacco abuse    a. ongoing 06/2012     Physical Exam: General: The patient is alert and oriented x3 in no acute distress.  Dermatology: Skin is warm, dry and supple bilateral lower extremities. Negative for open lesions or macerations.  Vascular: Palpable pedal pulses bilaterally.Neurovascular status intact.  Capillary refill within normal limits. There is a significant amount of swelling with some erythema most prominent to the dorsum of the left foot.  Neurological: Epicritic and protective threshold grossly intact bilaterally. Patient does present with some polyneuropathy and loss of sensation. Paresthesia with burning, shooting pain noted to the left forefoot.  Musculoskeletal Exam: Muscle strength 5/5 in all groups bilateral. Pain on palpation overlying the second and third metatarsals of the right foot. Pain with range of motion also noted.   Radiographic Exam:  Transverse fractures noted to metatarsals 2 and 3 of the left foot at the level of the  diaphysis midshaft with a significant amount of callus formation around the fracture sites..  Dorsal displacement of the second metatarsal head in relationship to the diaphysis with moderate displacement of the third metatarsal with bony callus formation. There is a large amount of callus formation however there appears to be nonunion to the diaphysis of the fracture sites despite the periwound callus.  Assessment: 1. 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 Neuritis left forefoot.   Plan of Care:  1. Patient evaluated. X-Rays reviewed.  2. Orders for physical therapy three times weekly for four weeks.  3. Continue taking Gabapentin 100 mg three times daily.  4. Continue wearing good shoe gear.  5. Return to clinic in 6 weeks.   Edrick Kins, DPM [facility]  Dr. Edrick Kins, DPM    [address], [place] [address]                Office [phone]  Fax [phone]

## 2018-02-23 NOTE — Telephone Encounter (Signed)
This is [employer]. I'm calling to get pt's office visit note from date of service 20 Feb 2018 faxed to [place] for pt's workers' compensation claim.

## 2018-02-28 ENCOUNTER — Telehealth: Payer: Self-pay | Admitting: Podiatry

## 2018-02-28 NOTE — Telephone Encounter (Signed)
Faxed referral, clinicals and demographics to [employer] - WC Adjuster [employer].

## 2018-02-28 NOTE — Telephone Encounter (Signed)
Workmans comp called for update on work status. Call transferred to [facility], [employer].

## 2018-03-07 ENCOUNTER — Telehealth: Payer: Self-pay | Admitting: *Deleted

## 2018-03-07 NOTE — Telephone Encounter (Signed)
Vee - [employer] with [employer] states they need PT orders faxed to [phone]. Faxed copy of referral of 02/22/2018, with rx with the referral PT orders handwritten on script attached to Fax Cover Sheet to [place] with [employer] Attn:  Vee.

## 2018-03-10 ENCOUNTER — Telehealth: Payer: Self-pay | Admitting: Podiatry

## 2018-03-10 NOTE — Telephone Encounter (Signed)
Faxed requested referral and most recent office visit notes to [facility]'s fax number of [phone].

## 2018-03-10 NOTE — Telephone Encounter (Signed)
This is [facility] calling. We are scheduled to see Mr. Benjamin Baker on Monday for therapy through workers' compensation. We are needing a script, most recent office visit notes, and or a referral. You can fax that information to [place] at [phone] or if you need to speak with [place] you can call us back at [phone]. Thank you.

## 2018-03-17 ENCOUNTER — Encounter: Payer: Self-pay | Admitting: Podiatry

## 2018-03-17 NOTE — Progress Notes (Signed)
Medical records requested by Shelbie Proctor with [facility] were e-mailed to him at the e-mail address of [email or web address].

## 2018-03-21 ENCOUNTER — Telehealth: Payer: Self-pay | Admitting: Podiatry

## 2018-03-21 NOTE — Telephone Encounter (Signed)
I've been a pt of Dr. Amalia Hailey through workers' compensation. Workers' comp is wanting me to have a second opinion on my injury. I need Dr. Amalia Hailey from my lawyer to put all my x-rays on a CD so I can take it for the 2nd opinion. I'm scheduled to see that doctor on 24 June. So I really need these ASAP. If you can give me a call at [phone] I would appreciate it. Thank you.

## 2018-03-21 NOTE — Telephone Encounter (Signed)
I called the pt and let him know his x-rays on a CD are ready at the front desk for him to pick up at his convenience. I told the pt that because this is a workers' compensation case we would not charge the $5.00 fee. I told the pt he would need to fill out and sign a medical records release form authorizing [place] to release his disc of x-rays to him. Pt stated he would be in sometime tomorrow to fill out and sign the form and to pick up his disc of x-rays.

## 2018-04-03 ENCOUNTER — Ambulatory Visit (INDEPENDENT_AMBULATORY_CARE_PROVIDER_SITE_OTHER): Payer: Worker's Compensation

## 2018-04-03 ENCOUNTER — Ambulatory Visit (INDEPENDENT_AMBULATORY_CARE_PROVIDER_SITE_OTHER): Payer: Worker's Compensation | Admitting: Podiatry

## 2018-04-03 DIAGNOSIS — S92902G Unspecified fracture of left foot, subsequent encounter for fracture with delayed healing: Secondary | ICD-10-CM

## 2018-04-03 NOTE — Progress Notes (Signed)
Patient ID: Benjamin Baker, male   DOB: [birth year], 62 y.o.   MRN: [id number]   HPI: 62 year old male with a past medical history of diabetes mellitus presents for follow-up evaluation of fractures to the second and third metatarsals left foot.  Patient recently got a second opinion from an orthopedist, Dr. Maia Breslow, as recommended by his attorney last week.  Patient states that the pain has improved with physical therapy.  He has been using the exigent bone stimulator.  He presents for further treatment evaluation    Past Medical History:  Diagnosis Date  . CAD (coronary artery disease)    a. 03/2002 - CABG x 3: LIMA > LAD, R Rad > OM2, SVG > PDA.    b. Cath 06/2012 - Significant three-vessel disease, 3/3 grafts patent. LVEF 60-65%   . Diabetes mellitus   . GERD (gastroesophageal reflux disease)   . Hyperlipidemia   . Hypertension   . Kidney stone   . Kidney stone on left side 05/11/2013  . Kidney stones   . NSTEMI (non-ST elevated myocardial infarction) [facility]) September 2013   grafts patent; EF normal; manage medically  . Tobacco abuse    a. ongoing 06/2012     Physical Exam: General: The patient is alert and oriented x3 in no acute distress.  Dermatology: Skin is warm, dry and supple bilateral lower extremities. Negative for open lesions or macerations.  Vascular: Palpable pedal pulses bilaterally.Neurovascular status intact.  Capillary refill within normal limits. There is improved swelling most prominent to the dorsum of the left foot.  Neurological: Epicritic and protective threshold grossly intact bilaterally. Patient does present with some polyneuropathy and loss of sensation. Paresthesia with burning, shooting pain noted to the left forefoot.  Musculoskeletal Exam: Muscle strength 5/5 in all groups bilateral. Pain on palpation overlying the second and third metatarsals of the right foot. Pain with range of motion also noted.   Radiographic Exam:  Transverse fractures noted  to metatarsals 2 and 3 of the left foot at the level of the diaphysis midshaft with displacement and a significant amount of callus formation around the fracture sites..  Fracture sites appear significantly improved compared to prior radiographic exams and demonstrate gradual healing.   Assessment: 1. 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 Neuritis left forefoot.   Plan of Care:  1. Patient evaluated. X-Rays reviewed and compared to prior radiographs.  2.  Patient states that physical therapy is improving his symptoms.  Prescription for physical therapy extension was provided today 3.  Continue using the exogen bone stimulator daily 4.  I explained to the patient that with the extent and severity of the metatarsal fractures these will likely take a while to heal.  However I do recommend continued conservative treatment. 5.  Continue wearing good supportive shoe gear 6.  Return to clinic in 8 weeks  Edrick Kins, DPM [facility]  Dr. Edrick Kins, DPM    [address], [place] [address]                Office [phone]  Fax [phone]

## 2018-04-24 ENCOUNTER — Telehealth: Payer: Self-pay | Admitting: *Deleted

## 2018-04-24 NOTE — Telephone Encounter (Signed)
Benjamin Baker - [facility] pt has been scheduled for 8 more sessions of PF, this should get pt to follow-up date with Dr. Amalia Hailey 05/29/2018.

## 2018-05-29 ENCOUNTER — Ambulatory Visit: Payer: Worker's Compensation | Admitting: Podiatry

## 2018-10-26 ENCOUNTER — Encounter (HOSPITAL_COMMUNITY): Payer: Self-pay | Admitting: *Deleted

## 2018-10-26 ENCOUNTER — Other Ambulatory Visit: Payer: Self-pay

## 2018-10-26 ENCOUNTER — Emergency Department (HOSPITAL_COMMUNITY)
Admission: EM | Admit: 2018-10-26 | Discharge: 2018-10-27 | Disposition: A | Discharge status: Home / Self Care | Payer: Self-pay | Attending: Emergency Medicine | Admitting: Emergency Medicine

## 2018-10-26 DIAGNOSIS — Z794 Long term (current) use of insulin: Secondary | ICD-10-CM | POA: Insufficient documentation

## 2018-10-26 DIAGNOSIS — F17210 Nicotine dependence, cigarettes, uncomplicated: Secondary | ICD-10-CM | POA: Insufficient documentation

## 2018-10-26 DIAGNOSIS — I10 Essential (primary) hypertension: Secondary | ICD-10-CM | POA: Insufficient documentation

## 2018-10-26 DIAGNOSIS — Z79899 Other long term (current) drug therapy: Secondary | ICD-10-CM | POA: Insufficient documentation

## 2018-10-26 DIAGNOSIS — L539 Erythematous condition, unspecified: Secondary | ICD-10-CM | POA: Insufficient documentation

## 2018-10-26 DIAGNOSIS — I872 Venous insufficiency (chronic) (peripheral): Secondary | ICD-10-CM | POA: Insufficient documentation

## 2018-10-26 DIAGNOSIS — R600 Localized edema: Secondary | ICD-10-CM | POA: Insufficient documentation

## 2018-10-26 DIAGNOSIS — E119 Type 2 diabetes mellitus without complications: Secondary | ICD-10-CM | POA: Insufficient documentation

## 2018-10-26 DIAGNOSIS — I2510 Atherosclerotic heart disease of native coronary artery without angina pectoris: Secondary | ICD-10-CM | POA: Insufficient documentation

## 2018-10-26 DIAGNOSIS — Z86718 Personal history of other venous thrombosis and embolism: Secondary | ICD-10-CM | POA: Insufficient documentation

## 2018-10-26 DIAGNOSIS — L03115 Cellulitis of right lower limb: Secondary | ICD-10-CM | POA: Insufficient documentation

## 2018-10-26 LAB — BASIC METABOLIC PANEL
Anion gap: 11 (ref 5–15)
BUN: 13 mg/dL (ref 6–20)
CHLORIDE: 98 mmol/L (ref 98–111)
CO2: 26 mmol/L (ref 22–32)
CREATININE: 1.24 mg/dL (ref 0.61–1.24)
Calcium: 8.8 mg/dL — ABNORMAL LOW (ref 8.9–10.3)
GFR calc Af Amer: >60 mL/min (ref >60)
GFR calc non Af Amer: >60 mL/min (ref >60)
Glucose, Bld: 626 mg/dL (ref 70–99)
POTASSIUM: 4.5 mmol/L (ref 3.5–5.1)
SODIUM: 135 mmol/L (ref 135–145)

## 2018-10-26 LAB — CBC
HEMATOCRIT: 39.7 % (ref 39.0–52.0)
HEMOGLOBIN: 12.7 g/dL — AB (ref 13.0–17.0)
MCH: 29.7 pg (ref 26.0–34.0)
MCHC: 32 g/dL (ref 30.0–36.0)
MCV: 92.8 fL (ref 80.0–100.0)
Platelets: 236 10*3/uL (ref 150–400)
RBC: 4.28 MIL/uL (ref 4.22–5.81)
RDW: 13.6 % (ref 11.5–15.5)
WBC: 7 10*3/uL (ref 4.0–10.5)
nRBC: 0 % (ref 0.0–0.2)

## 2018-10-26 NOTE — ED Triage Notes (Signed)
Pt reports he has been up and walking a lot at work. He reports since Tuesday he has had increased swelling in both of his legs. Feels like needle sticks and tenderness in his legs. Reports increased redness in the right leg. Has been wearing compression socks.

## 2018-10-27 ENCOUNTER — Emergency Department (HOSPITAL_BASED_OUTPATIENT_CLINIC_OR_DEPARTMENT_OTHER): Payer: Self-pay

## 2018-10-27 DIAGNOSIS — M7989 Other specified soft tissue disorders: Secondary | ICD-10-CM

## 2018-10-27 DIAGNOSIS — M79609 Pain in unspecified limb: Secondary | ICD-10-CM

## 2018-10-27 LAB — HEPATIC FUNCTION PANEL
ALT: 20 U/L (ref 0–44)
AST: 23 U/L (ref 15–41)
Albumin: 3.5 g/dL (ref 3.5–5.0)
Alkaline Phosphatase: 92 U/L (ref 38–126)
BILIRUBIN DIRECT: 0.2 mg/dL (ref 0.0–0.2)
BILIRUBIN INDIRECT: 1.3 mg/dL — AB (ref 0.3–0.9)
TOTAL PROTEIN: 6.9 g/dL (ref 6.5–8.1)
Total Bilirubin: 1.5 mg/dL — ABNORMAL HIGH (ref 0.3–1.2)

## 2018-10-27 LAB — CBG MONITORING, ED
GLUCOSE-CAPILLARY: 502 mg/dL — AB (ref 70–99)
GLUCOSE-CAPILLARY: 419 mg/dL — AB (ref 70–99)
GLUCOSE-CAPILLARY: 185 mg/dL — AB (ref 70–99)
Glucose-Capillary: 424 mg/dL — ABNORMAL HIGH (ref 70–99)

## 2018-10-27 LAB — BRAIN NATRIURETIC PEPTIDE: B NATRIURETIC PEPTIDE 5: 57.9 pg/mL (ref 0.0–100.0)

## 2018-10-27 MED ORDER — CEPHALEXIN 500 MG PO CAPS: 500.0000 mg | ORAL_CAPSULE | Freq: Four times a day (QID) | ORAL | 0 refills | Status: DC

## 2018-10-27 MED ORDER — FUROSEMIDE 20 MG PO TABS: 20.0000 mg | ORAL_TABLET | Freq: Every day | ORAL | 0 refills | Status: DC

## 2018-10-27 MED ORDER — INSULIN ASPART 100 UNIT/ML ~~LOC~~ SOLN
15.0000 [IU] | Freq: Once | SUBCUTANEOUS | Status: AC
  Administered 2018-10-27: 15 [IU] via SUBCUTANEOUS

## 2018-10-27 NOTE — ED Notes (Signed)
Patient verbalizes understanding of discharge instructions. Opportunity for questioning and answers were provided. Armband removed by staff, pt discharged from ED ambulatory.   

## 2018-10-27 NOTE — ED Provider Notes (Signed)
Assumed care at 0800.  Vascular study neg for DVT.  Pt treated for cellulitis.   Blanchie Dessert, MD 10/27/18 [phone]

## 2018-10-27 NOTE — Discharge Instructions (Addendum)
We saw in the ER for swelling in your legs.  DVT study is normal.  Please take the Lasix and the antibiotics as prescribed. We recommend that you put on compression hose throughout the day, especially if you can be standing for long time.  At nighttime you can take the compression stockings off and keep the legs elevated.  See your primary care doctor in 1 week.

## 2018-10-27 NOTE — ED Notes (Signed)
MD aware of glucose reading. Will continue to monitor

## 2018-10-27 NOTE — Progress Notes (Signed)
Right lower extremity venous duplex completed. Preliminary report called to Dr. Maryan Rued -Full report in Chart review CV Proc. [facility], [place] 10/27/2018, 9:03 AM

## 2018-10-27 NOTE — ED Notes (Signed)
RN Judson Roch notified of CBG 502.

## 2018-10-27 NOTE — ED Provider Notes (Signed)
[facility] EMERGENCY DEPARTMENT Provider Note   CSN: [id number] Arrival date & time: 10/26/18  2158     History   Chief Complaint Chief Complaint  Patient presents with  . Leg Swelling    HPI Benjamin Baker is a 63 y.o. male.  HPI  63 year old male with history of CAD, hypertension, hyperlipidemia comes in with chief complaint of leg swelling and pain.  Patient also has history of DVT.  Patient reports that over the past 2 or 3 days he has noticed increased swelling in both of his legs.  He is also having pain in both of his leg, right worse than left.  Patient has applied compression stockings without significant relief.  He has never had severe swelling like this in the past.  He denies any history of CHF, liver problems.  Past Medical History:  Diagnosis Date  . CAD (coronary artery disease)    a. 03/2002 - CABG x 3: LIMA > LAD, R Rad > OM2, SVG > PDA.    b. Cath 06/2012 - Significant three-vessel disease, 3/3 grafts patent. LVEF 60-65%   . Diabetes mellitus   . GERD (gastroesophageal reflux disease)   . Hyperlipidemia   . Hypertension   . Kidney stone   . Kidney stone on left side 05/11/2013  . Kidney stones   . NSTEMI (non-ST elevated myocardial infarction) [facility]) September 2013   grafts patent; EF normal; manage medically  . Tobacco abuse    a. ongoing 06/2012    Patient Active Problem List   Diagnosis Date Noted  . Coronary artery disease 02/20/2018  . Diabetes mellitus type 2, uncomplicated ([place]) 95/28/4132  . DVT, lower extremity ([place]) 05/29/2014  . Non-proliferative diabetic retinopathy, mild, left eye ([place]) 05/05/2014  . Seasonal allergies 03/06/2014  . Acute kidney injury ([place]) 05/12/2013  . Nephrolithiasis 05/11/2013  . NSTEMI (non-ST elevated myocardial infarction) ([place]) 06/29/2012  . Claudication ([place]) 06/28/2012  . Preventative health care 08/29/2011  . DIZZINESS 10/24/2007  . DYSLIPIDEMIA 09/24/2006  . TOBACCO ABUSE 09/24/2006  .  CATARACTS 09/24/2006  . HYPERTENSION 09/24/2006  . GERD 09/24/2006  . CORONARY ARTERY BYPASS GRAFT, THREE VESSEL, HX OF 09/24/2006  . DIABETES MELLITUS, TYPE II 10/04/2001    Past Surgical History:  Procedure Laterality Date  . CORONARY ARTERY BYPASS GRAFT  03/16/2002   a) x3: LIMA-LAD, R rad-Cx branch, SVG-PDA   . CYSTOSCOPY WITH URETEROSCOPY AND STENT PLACEMENT Left 05/12/2013   Procedure: CYSTOSCOPY WITH URETEROSCOPY AND STENT PLACEMENT;  Surgeon: Malka So, MD;  Location: [facility] ORS;  Service: Urology;  Laterality: Left;  . HOLMIUM LASER APPLICATION Left 01/05/101   Procedure: HOLMIUM LASER APPLICATION;  Surgeon: Malka So, MD;  Location: [facility] ORS;  Service: Urology;  Laterality: Left;  . LEFT HEART CATHETERIZATION WITH CORONARY/GRAFT ANGIOGRAM N/A 06/28/2012   Procedure: LEFT HEART CATHETERIZATION WITH Beatrix Fetters;  Surgeon: Sherren Mocha, MD;  Location: [facility] CATH LAB;  Service: Cardiovascular;  Laterality: N/A;        Home Medications    Prior to Admission medications   Medication Sig Start Date End Date Taking? Authorizing Provider  atorvastatin (LIPITOR) 40 MG tablet Take 40 mg by mouth daily.   Yes [provider]  insulin NPH-regular Human (NOVOLIN 70/30) (70-30) 100 UNIT/ML injection Inject 45 units into the skin every morning with breakfast and inject 45 units into the skin every evening with supper Patient taking differently: Inject 50 Units into the skin 2 (two) times daily with a  meal.  12/23/14  Yes Ahmed, Chesley Mires, MD  lisinopril (PRINIVIL,ZESTRIL) 10 MG tablet Take 10 mg by mouth daily.   Yes [provider]  metFORMIN (GLUCOPHAGE) 1000 MG tablet Take 1 tablet (1,000 mg total) by mouth 2 (two) times daily with a meal. 10/08/14  Yes Ahmed, Chesley Mires, MD  propranolol (INDERAL) 20 MG tablet Take 20-40 mg by mouth every 8 (eight) hours as needed (for tremors).   Yes [provider]  benzonatate (TESSALON) 100 MG capsule Take 2 capsules (200 mg  total) by mouth 2 (two) times daily as needed for cough. Patient not taking: Reported on 10/27/2018 01/31/18   Montine Circle, PA-C  CONTOUR NEXT TEST test strip  08/12/17   [provider]  doxycycline (VIBRAMYCIN) 100 MG capsule Take 1 capsule (100 mg total) by mouth 2 (two) times daily. Patient not taking: Reported on 10/27/2018 01/31/18   Montine Circle, PA-C  gabapentin (NEURONTIN) 100 MG capsule Take 1 capsule (100 mg total) by mouth 3 (three) times daily. Patient not taking: Reported on 10/27/2018 01/11/18   Edrick Kins, DPM    Family History Family History  Problem Relation Age of Onset  . Insulin resistance Mother 63  . Colon cancer Father 74  . Heart attack Father 75  . Stomach cancer Neg Hx     Social History Social History   Tobacco Use  . Smoking status: Current Every Day Smoker    Packs/day: 0.50    Years: 30.00    Pack years: 15.00    Types: Cigarettes  . Smokeless tobacco: Never Used  Substance Use Topics  . Alcohol use: No  . Drug use: No     Allergies   Patient has no known allergies.   Review of Systems Review of Systems  Constitutional: Positive for activity change.  Respiratory: Negative for shortness of breath.   Cardiovascular: Negative for chest pain.  Skin: Positive for rash.  All other systems reviewed and are negative.    Physical Exam Updated Vital Signs BP (!) 144/105   Pulse 88   Temp 98.1 F (36.7 C) (Oral)   Resp 20   Ht 5\' 9"  (1.753 m)   Wt 99.8 kg   SpO2 99%   BMI 32.49 kg/m   Physical Exam Vitals signs and nursing note reviewed.  Constitutional:      Appearance: He is well-developed.  HENT:     Head: Atraumatic.  Neck:     Musculoskeletal: Neck supple.  Cardiovascular:     Rate and Rhythm: Normal rate.  Pulmonary:     Effort: Pulmonary effort is normal.  Musculoskeletal:     Right lower leg: Edema present.     Left lower leg: Edema present.     Comments: 1+ pitting edema bilaterally in the lower  extremities, right worse than left  Skin:    General: Skin is warm.     Findings: Rash present.     Comments: Both legs are erythematous, however right leg is more erythematous and has warmth to touch.  Neurological:     Mental Status: He is alert and oriented to person, place, and time.      ED Treatments / Results  Labs (all labs ordered are listed, but only abnormal results are displayed) Labs Reviewed  CBC - Abnormal; Notable for the following components:      Result Value   Hemoglobin 12.7 (*)    All other components within normal limits  BASIC METABOLIC PANEL - Abnormal; Notable for  the following components:   Glucose, Bld 626 (*)    Calcium 8.8 (*)    All other components within normal limits  HEPATIC FUNCTION PANEL - Abnormal; Notable for the following components:   Total Bilirubin 1.5 (*)    Indirect Bilirubin 1.3 (*)    All other components within normal limits  CBG MONITORING, ED - Abnormal; Notable for the following components:   Glucose-Capillary 502 (*)    All other components within normal limits  CBG MONITORING, ED - Abnormal; Notable for the following components:   Glucose-Capillary 424 (*)    All other components within normal limits  CBG MONITORING, ED - Abnormal; Notable for the following components:   Glucose-Capillary 419 (*)    All other components within normal limits  BRAIN NATRIURETIC PEPTIDE    EKG None  Radiology No results found.  Procedures Procedures (including critical care time)  Medications Ordered in ED Medications  insulin aspart (novoLOG) injection 15 Units (has no administration in time range)  insulin aspart (novoLOG) injection 15 Units (15 Units Subcutaneous Given 10/27/18 0408)     Initial Impression / Assessment and Plan / ED Course  I have reviewed the triage vital signs and the nursing notes.  Pertinent labs & imaging results that were available during my care of the patient were reviewed by me and considered in my  medical decision making (see chart for details).     63 year old male with history of CAD, diabetes comes in with chief complaint of bilateral lower extremity swelling.  He is also noted to have erythematous and painful right lower extremity.  He has history of DVT in the left lower extremity.  On exam he is calf tenderness on the right lower extremity along with unilateral swelling that is worse on that side compared to the left side.  BNP is normal.  Albumin is normal.  Plan is to get ultrasound right lower extremity.  If the ultrasound is negative, then patient will go home with Lasix for 5 days along with Keflex for likely cellulitis.  I suspect that he is having cellulitis versus stasis dermatitis if the DVT study is negative.  Final Clinical Impressions(s) / ED Diagnoses   Final diagnoses:  Stasis dermatitis of both legs  Cellulitis of right lower extremity    ED Discharge Orders    None       Varney Biles, MD 10/27/18 [phone]

## 2018-12-08 ENCOUNTER — Other Ambulatory Visit: Payer: Self-pay

## 2018-12-08 ENCOUNTER — Emergency Department (HOSPITAL_COMMUNITY)
Admission: EM | Admit: 2018-12-08 | Discharge: 2018-12-08 | Disposition: A | Discharge status: Home / Self Care | Payer: Self-pay | Attending: Emergency Medicine | Admitting: Emergency Medicine

## 2018-12-08 ENCOUNTER — Emergency Department (HOSPITAL_COMMUNITY): Payer: Self-pay

## 2018-12-08 DIAGNOSIS — I2510 Atherosclerotic heart disease of native coronary artery without angina pectoris: Secondary | ICD-10-CM | POA: Insufficient documentation

## 2018-12-08 DIAGNOSIS — E860 Dehydration: Secondary | ICD-10-CM | POA: Insufficient documentation

## 2018-12-08 DIAGNOSIS — Z951 Presence of aortocoronary bypass graft: Secondary | ICD-10-CM | POA: Insufficient documentation

## 2018-12-08 DIAGNOSIS — Z794 Long term (current) use of insulin: Secondary | ICD-10-CM | POA: Insufficient documentation

## 2018-12-08 DIAGNOSIS — R42 Dizziness and giddiness: Secondary | ICD-10-CM | POA: Insufficient documentation

## 2018-12-08 DIAGNOSIS — I10 Essential (primary) hypertension: Secondary | ICD-10-CM | POA: Insufficient documentation

## 2018-12-08 DIAGNOSIS — E119 Type 2 diabetes mellitus without complications: Secondary | ICD-10-CM | POA: Insufficient documentation

## 2018-12-08 DIAGNOSIS — F17210 Nicotine dependence, cigarettes, uncomplicated: Secondary | ICD-10-CM | POA: Insufficient documentation

## 2018-12-08 DIAGNOSIS — Z79899 Other long term (current) drug therapy: Secondary | ICD-10-CM | POA: Insufficient documentation

## 2018-12-08 DIAGNOSIS — N179 Acute kidney failure, unspecified: Secondary | ICD-10-CM | POA: Insufficient documentation

## 2018-12-08 DIAGNOSIS — I252 Old myocardial infarction: Secondary | ICD-10-CM | POA: Insufficient documentation

## 2018-12-08 LAB — CBC
HCT: 40.2 % (ref 39.0–52.0)
Hemoglobin: 12.8 g/dL — ABNORMAL LOW (ref 13.0–17.0)
MCH: 29.3 pg (ref 26.0–34.0)
MCHC: 31.8 g/dL (ref 30.0–36.0)
MCV: 92 fL (ref 80.0–100.0)
Platelets: 278 10*3/uL (ref 150–400)
RBC: 4.37 MIL/uL (ref 4.22–5.81)
RDW: 14 % (ref 11.5–15.5)
WBC: 9.8 10*3/uL (ref 4.0–10.5)
nRBC: 0 % (ref 0.0–0.2)

## 2018-12-08 LAB — BASIC METABOLIC PANEL
ANION GAP: 7 (ref 5–15)
BUN: 24 mg/dL — ABNORMAL HIGH (ref 6–20)
CO2: 25 mmol/L (ref 22–32)
Calcium: 9.2 mg/dL (ref 8.9–10.3)
Chloride: 101 mmol/L (ref 98–111)
Creatinine, Ser: 1.98 mg/dL — ABNORMAL HIGH (ref 0.61–1.24)
GFR calc Af Amer: 41 mL/min — ABNORMAL LOW (ref >60)
GFR calc non Af Amer: 36 mL/min — ABNORMAL LOW (ref >60)
Glucose, Bld: 220 mg/dL — ABNORMAL HIGH (ref 70–99)
Potassium: 5.2 mmol/L — ABNORMAL HIGH (ref 3.5–5.1)
Sodium: 133 mmol/L — ABNORMAL LOW (ref 135–145)

## 2018-12-08 LAB — I-STAT TROPONIN, ED: TROPONIN I, POC: 0 ng/mL (ref 0.00–0.08)

## 2018-12-08 MED ORDER — SODIUM CHLORIDE 0.9 % IV BOLUS (SEPSIS)
1000.0000 mL | Freq: Once | INTRAVENOUS | Status: AC
  Administered 2018-12-08: 1000 mL via INTRAVENOUS

## 2018-12-08 MED ORDER — LORAZEPAM 2 MG/ML IJ SOLN
1.0000 mg | Freq: Once | INTRAMUSCULAR | Status: AC
  Administered 2018-12-08: 1 mg via INTRAVENOUS
  Filled 2018-12-08: qty 1

## 2018-12-08 MED ORDER — MECLIZINE HCL 25 MG PO TABS
25.0000 mg | ORAL_TABLET | Freq: Once | ORAL | Status: AC
  Administered 2018-12-08: 25 mg via ORAL
  Filled 2018-12-08: qty 1

## 2018-12-08 MED ORDER — MECLIZINE HCL 25 MG PO TABS: 25.0000 mg | ORAL_TABLET | Freq: Three times a day (TID) | ORAL | 0 refills | Status: AC | PRN

## 2018-12-08 NOTE — ED Notes (Signed)
The pad in this room is not working correctly. Pt acknowledge discharge and was given papers.

## 2018-12-08 NOTE — Discharge Instructions (Addendum)

## 2018-12-08 NOTE — ED Triage Notes (Signed)
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 No N/V  no chest pain. CBG 215 134/74, 78 hr, 98 ra, 18 rr. w

## 2018-12-08 NOTE — ED Provider Notes (Signed)
[facility] EMERGENCY DEPARTMENT Provider Note   CSN: [id number] Arrival date & time: 12/08/18  0000    History   Chief Complaint Chief Complaint  Patient presents with  . Dizziness    HPI Benjamin Baker is a 62 y.o. male.     The history is provided by the patient.  Dizziness  Quality:  Room spinning Severity:  Moderate Duration: several weeks. Timing:  Intermittent Progression:  Worsening Chronicity:  New Relieved by:  Being still Worsened by:  Movement Associated symptoms: no chest pain, no diarrhea, no headaches, no hearing loss, no shortness of breath, no syncope, no vision changes, no vomiting and no weakness   Risk factors: no hx of stroke    Patient reports dizziness for several weeks.  He reports it is mostly when he is walking.  No falls or syncope.  No focal weakness.  No chest pain or shortness of breath.  No headache or visual loss. He reports that occurred at work recently and his blood glucose was high and his blood pressure was low.  He has been working long hours at a [employer] and not eating well.  Tonight while at work it became very severe he was unable to walk due to dizziness his coworker called [phone] No new medicines. Past Medical History:  Diagnosis Date  . CAD (coronary artery disease)    a. 03/2002 - CABG x 3: LIMA > LAD, R Rad > OM2, SVG > PDA.    b. Cath 06/2012 - Significant three-vessel disease, 3/3 grafts patent. LVEF 60-65%   . Diabetes mellitus   . GERD (gastroesophageal reflux disease)   . Hyperlipidemia   . Hypertension   . Kidney stone   . Kidney stone on left side 05/11/2013  . Kidney stones   . NSTEMI (non-ST elevated myocardial infarction) [facility]) September 2013   grafts patent; EF normal; manage medically  . Tobacco abuse    a. ongoing 06/2012    Patient Active Problem List   Diagnosis Date Noted  . Coronary artery disease 02/20/2018  . Diabetes mellitus type 2, uncomplicated ([place]) 65/46/5035    . DVT, lower extremity ([place]) 05/29/2014  . Non-proliferative diabetic retinopathy, mild, left eye ([place]) 05/05/2014  . Seasonal allergies 03/06/2014  . Acute kidney injury ([place]) 05/12/2013  . Nephrolithiasis 05/11/2013  . NSTEMI (non-ST elevated myocardial infarction) ([place]) 06/29/2012  . Claudication ([place]) 06/28/2012  . Preventative health care 08/29/2011  . DIZZINESS 10/24/2007  . DYSLIPIDEMIA 09/24/2006  . TOBACCO ABUSE 09/24/2006  . CATARACTS 09/24/2006  . HYPERTENSION 09/24/2006  . GERD 09/24/2006  . CORONARY ARTERY BYPASS GRAFT, THREE VESSEL, HX OF 09/24/2006  . DIABETES MELLITUS, TYPE II 10/04/2001    Past Surgical History:  Procedure Laterality Date  . CORONARY ARTERY BYPASS GRAFT  03/16/2002   a) x3: LIMA-LAD, R rad-Cx branch, SVG-PDA   . CYSTOSCOPY WITH URETEROSCOPY AND STENT PLACEMENT Left 05/12/2013   Procedure: CYSTOSCOPY WITH URETEROSCOPY AND STENT PLACEMENT;  Surgeon: Malka So, MD;  Location: [facility];  Service: Urology;  Laterality: Left;  . HOLMIUM LASER APPLICATION Left 01/08/5680   Procedure: HOLMIUM LASER APPLICATION;  Surgeon: Malka So, MD;  Location: [facility];  Service: Urology;  Laterality: Left;  . LEFT HEART CATHETERIZATION WITH CORONARY/GRAFT ANGIOGRAM N/A 06/28/2012   Procedure: LEFT HEART CATHETERIZATION WITH Beatrix Fetters;  Surgeon: Sherren Mocha, MD;  Location: [facility] CATH LAB;  Service: Cardiovascular;  Laterality: N/A;        Home Medications  Prior to Admission medications   Medication Sig Start Date End Date Taking? Authorizing Provider  atorvastatin (LIPITOR) 40 MG tablet Take 40 mg by mouth daily.    [provider]  CONTOUR NEXT TEST test strip  08/12/17   [provider]  furosemide (LASIX) 20 MG tablet Take 1 tablet (20 mg total) by mouth daily. 10/27/18   Varney Biles, MD  gabapentin (NEURONTIN) 100 MG capsule Take 1 capsule (100 mg total) by mouth 3 (three) times daily. Patient not taking: Reported on  10/27/2018 01/11/18   Edrick Kins, DPM  insulin NPH-regular Human (NOVOLIN 70/30) (70-30) 100 UNIT/ML injection Inject 45 units into the skin every morning with breakfast and inject 45 units into the skin every evening with supper Patient taking differently: Inject 50 Units into the skin 2 (two) times daily with a meal.  12/23/14   Ahmed, Chesley Mires, MD  lisinopril (PRINIVIL,ZESTRIL) 10 MG tablet Take 10 mg by mouth daily.    [provider]  metFORMIN (GLUCOPHAGE) 1000 MG tablet Take 1 tablet (1,000 mg total) by mouth 2 (two) times daily with a meal. 10/08/14   Ahmed, Chesley Mires, MD  propranolol (INDERAL) 20 MG tablet Take 20-40 mg by mouth every 8 (eight) hours as needed (for tremors).    [provider]    Family History Family History  Problem Relation Age of Onset  . Insulin resistance Mother 108  . Colon cancer Father 3  . Heart attack Father 43  . Stomach cancer Neg Hx     Social History Social History   Tobacco Use  . Smoking status: Current Every Day Smoker    Packs/day: 0.50    Years: 30.00    Pack years: 15.00    Types: Cigarettes  . Smokeless tobacco: Never Used  Substance Use Topics  . Alcohol use: No  . Drug use: No     Allergies   Patient has no known allergies.   Review of Systems Review of Systems  Constitutional: Negative for fever.  HENT: Negative for hearing loss.   Eyes: Negative for visual disturbance.  Respiratory: Negative for shortness of breath.   Cardiovascular: Negative for chest pain and syncope.  Gastrointestinal: Negative for diarrhea and vomiting.  Neurological: Positive for dizziness. Negative for syncope, weakness and headaches.  All other systems reviewed and are negative.    Physical Exam Updated Vital Signs BP 122/62 (BP Location: Right Arm)   Pulse 70   Temp (!) 97.5 F (36.4 C) (Oral)   Resp 16   Ht 1.727 m (5\' 8" )   Wt 99.8 kg   SpO2 100%   BMI 33.45 kg/m   Physical Exam CONSTITUTIONAL: Well developed/well  nourished HEAD: Normocephalic/atraumatic EYES: EOMI/PERRL, no nystagmus,  no ptosis ENMT: Mucous membranes moist NECK: supple no meningeal signs, no bruits CV: S1/S2 noted, no murmurs/rubs/gallops noted LUNGS: Lungs are clear to auscultation bilaterally, no apparent distress ABDOMEN: soft, nontender, no rebound or guarding GU:no cva tenderness NEURO:Awake/alert, face symmetric, no arm or leg drift is noted Equal 5/5 strength with shoulder abduction, elbow flex/extension, wrist flex/extension in upper extremities and equal hand grips bilaterally Equal 5/5 strength with hip flexion,knee flex/extension, foot dorsi/plantar flexion Cranial nerves 3/4/5/6/04/11/09/11/12 tested and intact No past pointing Sensation to light touch intact in all extremities EXTREMITIES: pulses normal, full ROM SKIN: warm, color normal PSYCH: no abnormalities of mood noted   ED Treatments / Results  Labs (all labs ordered are listed, but only abnormal results are displayed) Labs Reviewed  BASIC METABOLIC PANEL - Abnormal; Notable for the following components:      Result Value   Sodium 133 (*)    Potassium 5.2 (*)    Glucose, Bld 220 (*)    BUN 24 (*)    Creatinine, Ser 1.98 (*)    GFR calc non Af Amer 36 (*)    GFR calc Af Amer 41 (*)    All other components within normal limits  CBC - Abnormal; Notable for the following components:   Hemoglobin 12.8 (*)    All other components within normal limits  I-STAT TROPONIN, ED    EKG ED ECG REPORT   Date: 12/08/2018 0013  Rate: 72  Rhythm: normal sinus rhythm  QRS Axis: normal  Intervals: normal  ST/T Wave abnormalities: normal  Conduction Disutrbances:none  I have personally reviewed the EKG tracing and agree with the computerized printout as noted.  Radiology Mr Brain Wo Contrast  Result Date: 12/08/2018 CLINICAL DATA:  Vertigo. Leg swelling and pain. History of hypertension and hyperlipidemia. EXAM: MRI HEAD WITHOUT CONTRAST TECHNIQUE:  Multiplanar, multiecho pulse sequences of the brain and surrounding structures were obtained without intravenous contrast. COMPARISON:  None. FINDINGS: INTRACRANIAL CONTENTS: No reduced diffusion to suggest acute ischemia. No susceptibility artifact to suggest hemorrhage. No parenchymal brain volume loss for age. No hydrocephalus. Patchy to confluent supratentorial white matter and pontine FLAIR T2 hyperintensities, linear cystic component LEFT frontal lobe. No suspicious parenchymal signal, masses, mass effect. No abnormal extra-axial fluid collections. No extra-axial masses. VASCULAR: Normal major intracranial vascular flow voids present at skull base. SKULL AND UPPER CERVICAL SPINE: No abnormal sellar expansion. No suspicious calvarial bone marrow signal. Craniocervical junction maintained. 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 Status post bilateral ocular lens implants.The included ocular globes and orbital contents are non-suspicious. OTHER: Patient is edentulous. IMPRESSION: 1. No acute intracranial process. 2. Mild-to-moderate chronic small vessel ischemic changes. Electronically Signed   By: Elon Alas M.D.   On: 12/08/2018 05:28    Procedures Procedures    Medications Ordered in ED Medications  sodium chloride 0.9 % bolus 1,000 mL (0 mLs Intravenous Stopped 12/08/18 0130)  sodium chloride 0.9 % bolus 1,000 mL (0 mLs Intravenous Stopped 12/08/18 0308)  meclizine (ANTIVERT) tablet 25 mg (25 mg Oral Given 12/08/18 0147)  LORazepam (ATIVAN) injection 1 mg (1 mg Intravenous Given 12/08/18 0355)     Initial Impression / Assessment and Plan / ED Course  I have reviewed the triage vital signs and the nursing notes.  Pertinent labs   results that were available during my care of the patient were reviewed by me and considered in my medical decision making (see chart for details).        3:06 AM Reports ongoing vertiginous symptoms and dizziness for several  weeks.  Apparently became so severe at work tonight he could not even walk.  He is able to stand but has difficulty walking on repeat exam.  He was given 2 L IV fluid as he does appear dehydrated by labs.  He was also given meclizine. However his symptoms have not resolved and he is having difficulty walking. Need to proceed with MRI imaging.  Reports has had MRIs before, we will proceed with imaging of brain 6:22 AM MRI brain negative.  Patient has been resting comfortably. He has no new complaints Will d/c home Advised to STOP lasix as this may have contributed to dehydration F/u with PCP Final Clinical Impressions(s) / ED Diagnoses   Final  diagnoses:  Vertigo  AKI (acute kidney injury) ([place])  Dehydration    ED Discharge Orders         Ordered    meclizine (ANTIVERT) 25 MG tablet  3 times daily PRN     12/08/18 0557           Ripley Fraise, MD 12/08/18 [phone]

## 2018-12-08 NOTE — ED Notes (Signed)
Patient transported to MRI 

## 2019-02-21 ENCOUNTER — Encounter (HOSPITAL_COMMUNITY): Payer: Self-pay

## 2019-02-21 ENCOUNTER — Emergency Department (HOSPITAL_COMMUNITY)
Admission: EM | Admit: 2019-02-21 | Discharge: 2019-02-21 | Disposition: A | Discharge status: Home / Self Care | Payer: Self-pay | Attending: Emergency Medicine | Admitting: Emergency Medicine

## 2019-02-21 ENCOUNTER — Other Ambulatory Visit: Payer: Self-pay

## 2019-02-21 ENCOUNTER — Emergency Department (HOSPITAL_COMMUNITY): Payer: Self-pay

## 2019-02-21 DIAGNOSIS — Z20828 Contact with and (suspected) exposure to other viral communicable diseases: Secondary | ICD-10-CM | POA: Insufficient documentation

## 2019-02-21 DIAGNOSIS — J189 Pneumonia, unspecified organism: Secondary | ICD-10-CM

## 2019-02-21 LAB — CBC
HCT: 42.4 % (ref 39.0–52.0)
Hemoglobin: 13.7 g/dL (ref 13.0–17.0)
MCH: 29 pg (ref 26.0–34.0)
MCHC: 32.3 g/dL (ref 30.0–36.0)
MCV: 89.6 fL (ref 80.0–100.0)
Platelets: 292 10*3/uL (ref 150–400)
RBC: 4.73 MIL/uL (ref 4.22–5.81)
RDW: 13.2 % (ref 11.5–15.5)
WBC: 15.1 10*3/uL — ABNORMAL HIGH (ref 4.0–10.5)
nRBC: 0 % (ref 0.0–0.2)

## 2019-02-21 LAB — BASIC METABOLIC PANEL
Anion gap: 14 (ref 5–15)
BUN: 10 mg/dL (ref 6–20)
CO2: 26 mmol/L (ref 22–32)
Calcium: 9.2 mg/dL (ref 8.9–10.3)
Chloride: 99 mmol/L (ref 98–111)
Creatinine, Ser: 1.33 mg/dL — ABNORMAL HIGH (ref 0.61–1.24)
GFR calc Af Amer: >60 mL/min (ref >60)
GFR calc non Af Amer: 58 mL/min — ABNORMAL LOW (ref >60)
Glucose, Bld: 387 mg/dL — ABNORMAL HIGH (ref 70–99)
Potassium: 4 mmol/L (ref 3.5–5.1)
Sodium: 139 mmol/L (ref 135–145)

## 2019-02-21 LAB — LACTIC ACID, PLASMA
Lactic Acid, Venous: 2.6 mmol/L (ref 0.5–1.9)
Lactic Acid, Venous: 1.9 mmol/L (ref 0.5–1.9)

## 2019-02-21 LAB — TROPONIN I: Troponin I: <0.03 ng/mL (ref <0.03)

## 2019-02-21 LAB — SARS CORONAVIRUS 2 BY RT PCR (HOSPITAL ORDER, PERFORMED IN ~~LOC~~ HOSPITAL LAB): SARS Coronavirus 2: NEGATIVE

## 2019-02-21 MED ORDER — ACETAMINOPHEN 325 MG PO TABS
650.0000 mg | ORAL_TABLET | Freq: Once | ORAL | Status: AC | PRN
  Administered 2019-02-21: 650 mg via ORAL
  Filled 2019-02-21: qty 2

## 2019-02-21 MED ORDER — AZITHROMYCIN 250 MG PO TABS: 250.0000 mg | ORAL_TABLET | Freq: Every day | ORAL | 0 refills | Status: AC

## 2019-02-21 MED ORDER — SODIUM CHLORIDE 0.9 % IV BOLUS (SEPSIS)
1000.0000 mL | Freq: Once | INTRAVENOUS | Status: AC
  Administered 2019-02-21: 1000 mL via INTRAVENOUS

## 2019-02-21 MED ORDER — SODIUM CHLORIDE 0.9 % IV SOLN
1.0000 g | Freq: Once | INTRAVENOUS | Status: AC
  Administered 2019-02-21: 1 g via INTRAVENOUS
  Filled 2019-02-21: qty 10

## 2019-02-21 MED ORDER — KETOROLAC TROMETHAMINE 30 MG/ML IJ SOLN
30.0000 mg | Freq: Once | INTRAMUSCULAR | Status: AC
  Administered 2019-02-21: 30 mg via INTRAVENOUS
  Filled 2019-02-21: qty 1

## 2019-02-21 MED ORDER — AMOXICILLIN-POT CLAVULANATE 875-125 MG PO TABS: 1.0000 | ORAL_TABLET | Freq: Two times a day (BID) | ORAL | 0 refills | Status: AC

## 2019-02-21 MED ORDER — SODIUM CHLORIDE 0.9 % IV SOLN
500.0000 mg | Freq: Once | INTRAVENOUS | Status: AC
  Administered 2019-02-21: 500 mg via INTRAVENOUS
  Filled 2019-02-21: qty 500

## 2019-02-21 NOTE — ED Provider Notes (Signed)
[facility] EMERGENCY DEPARTMENT Provider Note   CSN: [id number] Arrival date & time: 02/21/19  0245    History   Chief Complaint Chief Complaint  Patient presents with   Chest Pain    HPI Benjamin Baker is a 63 y.o. male.     The history is provided by the patient.  Cough  Severity:  Moderate Onset quality:  Gradual Duration:  1 week Timing:  Intermittent Progression:  Worsening Chronicity:  New Smoker: former smoker.   Relieved by:  None tried Worsened by:  Nothing Associated symptoms: chest pain, chills and shortness of breath    Patient reports for the past week he has been having worsening cough.  Tonight he woke up with chills, fevers, chest pain and shortness of breath.  He reports his cough is worsening  Past Medical History:  Diagnosis Date   CAD (coronary artery disease)    a. 03/2002 - CABG x 3: LIMA > LAD, R Rad > OM2, SVG > PDA.    b. Cath 06/2012 - Significant three-vessel disease, 3/3 grafts patent. LVEF 60-65%    Diabetes mellitus    GERD (gastroesophageal reflux disease)    Hyperlipidemia    Hypertension    Kidney stone    Kidney stone on left side 05/11/2013   Kidney stones    NSTEMI (non-ST elevated myocardial infarction) [facility]) September 2013   grafts patent; EF normal; manage medically   Tobacco abuse    a. ongoing 06/2012    Patient Active Problem List   Diagnosis Date Noted   Coronary artery disease 02/20/2018   Diabetes mellitus type 2, uncomplicated ([place]) 07/20/5101   DVT, lower extremity ([place]) 05/29/2014   Non-proliferative diabetic retinopathy, mild, left eye ([place]) 05/05/2014   Seasonal allergies 03/06/2014   Acute kidney injury ([place]) 05/12/2013   Nephrolithiasis 05/11/2013   NSTEMI (non-ST elevated myocardial infarction) ([place]) 06/29/2012   Claudication ([place]) 06/28/2012   Preventative health care 08/29/2011   DIZZINESS 10/24/2007   DYSLIPIDEMIA 09/24/2006   TOBACCO ABUSE 09/24/2006    CATARACTS 09/24/2006   HYPERTENSION 09/24/2006   GERD 09/24/2006   CORONARY ARTERY BYPASS GRAFT, THREE VESSEL, HX OF 09/24/2006   DIABETES MELLITUS, TYPE II 10/04/2001    Past Surgical History:  Procedure Laterality Date   CORONARY ARTERY BYPASS GRAFT  03/16/2002   a) x3: LIMA-LAD, R rad-Cx branch, SVG-PDA    CYSTOSCOPY WITH URETEROSCOPY AND STENT PLACEMENT Left 05/12/2013   Procedure: CYSTOSCOPY WITH URETEROSCOPY AND STENT PLACEMENT;  Surgeon: Malka So, MD;  Location: [facility];  Service: Urology;  Laterality: Left;   HOLMIUM LASER APPLICATION Left 02/09/5276   Procedure: HOLMIUM LASER APPLICATION;  Surgeon: Malka So, MD;  Location: [facility];  Service: Urology;  Laterality: Left;   LEFT HEART CATHETERIZATION WITH CORONARY/GRAFT ANGIOGRAM N/A 06/28/2012   Procedure: LEFT HEART CATHETERIZATION WITH Beatrix Fetters;  Surgeon: Sherren Mocha, MD;  Location: [facility] CATH LAB;  Service: Cardiovascular;  Laterality: N/A;        Home Medications    Prior to Admission medications   Medication Sig Start Date End Date Taking? Authorizing Provider  albuterol (VENTOLIN HFA) 108 (90 Base) MCG/ACT inhaler Inhale 1-2 puffs into the lungs every 6 (six) hours as needed for wheezing or shortness of breath.    [provider]  aspirin EC 81 MG tablet Take 81 mg by mouth daily.    [provider]  atorvastatin (LIPITOR) 40 MG tablet Take 40 mg by mouth daily.  [provider]  CONTOUR NEXT TEST test strip  08/12/17   [provider]  gabapentin (NEURONTIN) 100 MG capsule Take 1 capsule (100 mg total) by mouth 3 (three) times daily. Patient not taking: Reported on 12/08/2018 01/11/18   Edrick Kins, DPM  insulin NPH-regular Human (NOVOLIN 70/30) (70-30) 100 UNIT/ML injection Inject 45 units into the skin every morning with breakfast and inject 45 units into the skin every evening with supper Patient taking differently: Inject 50 Units into the skin 2 (two)  times daily with a meal.  12/23/14   Ahmed, Chesley Mires, MD  lisinopril (PRINIVIL,ZESTRIL) 10 MG tablet Take 10 mg by mouth daily.    [provider]  meclizine (ANTIVERT) 25 MG tablet Take 1 tablet (25 mg total) by mouth 3 (three) times daily as needed for dizziness. 12/08/18   Ripley Fraise, MD  metFORMIN (GLUCOPHAGE) 1000 MG tablet Take 1 tablet (1,000 mg total) by mouth 2 (two) times daily with a meal. 10/08/14   Ahmed, Chesley Mires, MD  propranolol (INDERAL) 20 MG tablet Take 20-40 mg by mouth daily.     [provider]    Family History Family History  Problem Relation Age of Onset   Insulin resistance Mother 40   Colon cancer Father 49   Heart attack Father 31   Stomach cancer Neg Hx     Social History Social History   Tobacco Use   Smoking status: Former Smoker    Packs/day: 0.50    Years: 30.00    Pack years: 15.00    Types: Cigarettes   Smokeless tobacco: Never Used  Substance Use Topics   Alcohol use: No   Drug use: No     Allergies   Patient has no known allergies.   Review of Systems Review of Systems  Constitutional: Positive for chills.  Respiratory: Positive for cough and shortness of breath.   Cardiovascular: Positive for chest pain.  All other systems reviewed and are negative.    Physical Exam Updated Vital Signs BP 120/67    Pulse (!) 110    Temp (!) 102.8 F (39.3 C) (Oral)    Resp (!) 23    Ht 1.753 m (5\' 9" )    Wt 99.8 kg    SpO2 97%    BMI 32.49 kg/m   Physical Exam  CONSTITUTIONAL: Well developed/well nourished HEAD: Normocephalic/atraumatic EYES: EOMI/PERRL ENMT: Mucous membranes moist NECK: supple no meningeal signs SPINE/BACK:entire spine nontender CV: tachycardic LUNGS: mild tachypnea ABDOMEN: soft, nontender, no rebound or guarding, bowel sounds noted throughout abdomen GU:no cva tenderness NEURO: Pt is awake/alert/appropriate, moves all extremitiesx4.  No facial droop.   EXTREMITIES: pulses normal/equal, full  ROM SKIN: warm, color normal PSYCH: no abnormalities of mood noted, alert and oriented to situation  ED Treatments / Results  Labs (all labs ordered are listed, but only abnormal results are displayed) Labs Reviewed  BASIC METABOLIC PANEL - Abnormal; Notable for the following components:      Result Value   Glucose, Bld 387 (*)    Creatinine, Ser 1.33 (*)    GFR calc non Af Amer 58 (*)    All other components within normal limits  CBC - Abnormal; Notable for the following components:   WBC 15.1 (*)    All other components within normal limits  LACTIC ACID, PLASMA - Abnormal; Notable for the following components:   Lactic Acid, Venous 2.6 (*)    All other components within normal limits  SARS CORONAVIRUS 2 [facility]  ORDER, PERFORMED IN [place] LAB)  CULTURE, BLOOD (ROUTINE X 2)  CULTURE, BLOOD (ROUTINE X 2)  TROPONIN I  LACTIC ACID, PLASMA    EKG EKG Interpretation  Date/Time:  Wednesday Feb 21 2019 03:54:16 EDT Ventricular Rate:  113 PR Interval:  238 QRS Duration: 86 QT Interval:  287 QTC Calculation: 394 R Axis:   28 Text Interpretation:  Sinus tachycardia Prolonged PR interval Probable left atrial enlargement Abnormal ekg Confirmed by Ripley Fraise [phone]) on 02/21/2019 4:00:27 AM   Radiology Dg Chest Portable 1 View  Result Date: 02/21/2019 CLINICAL DATA:  63 year old male with shortness of breath, cough and fever for 3 days. EXAM: PORTABLE CHEST 1 VIEW COMPARISON:  01/30/2018 and earlier. FINDINGS: Portable AP semi upright view at 0300 hours. Prior CABG. Normal cardiac size and mediastinal contours. Stable lung volumes. There is mild asymmetric reticulonodular opacity at the right lung base. No pneumothorax, pulmonary edema or other confluent pulmonary opacity. A chronic appearing left posterior 7th rib fracture is new. No acute osseous abnormality identified. Paucity bowel gas in the upper abdomen. IMPRESSION: Mild new asymmetric opacity at the right  lung base suspicious for developing pneumonia in this clinical setting. Electronically Signed   By: Genevie Ann M.D.   On: 02/21/2019 03:50    Procedures Procedures   Medications Ordered in ED Medications  azithromycin (ZITHROMAX) 500 mg in sodium chloride 0.9 % 250 mL IVPB (500 mg Intravenous New Bag/Given 02/21/19 0654)  sodium chloride 0.9 % bolus 1,000 mL (1,000 mLs Intravenous New Bag/Given 02/21/19 0654)  acetaminophen (TYLENOL) tablet 650 mg (650 mg Oral Given 02/21/19 0305)  sodium chloride 0.9 % bolus 1,000 mL (0 mLs Intravenous Stopped 02/21/19 0528)  ketorolac (TORADOL) 30 MG/ML injection 30 mg (30 mg Intravenous Given 02/21/19 0459)  cefTRIAXone (ROCEPHIN) 1 g in sodium chloride 0.9 % 100 mL IVPB (0 g Intravenous Stopped 02/21/19 0532)     Initial Impression / Assessment and Plan / ED Course  I have reviewed the triage vital signs and the nursing notes.  Pertinent labs & imaging results that were available during my care of the patient were reviewed by me and considered in my medical decision making (see chart for details).       7:09 AM Pt with cough for past week, then developed fever/chills/CP Pt with pneumonia COVID negative At signout to dr pickering, recheck patient after lactate If improved and no hypoxia he can be discharged on antibiotics  Final Clinical Impressions(s) / ED Diagnoses   Final diagnoses:  Community acquired pneumonia of right lower lobe of lung [facility])    ED Discharge Orders    None       Ripley Fraise, MD 02/21/19 [phone]

## 2019-02-21 NOTE — ED Triage Notes (Signed)
Pt c/o CP @ 01:20 this a.m. after vomiting. Also c/o SOB.

## 2019-02-21 NOTE — ED Notes (Signed)
Patient verbalized understanding of discharge instructions and denies any further needs or questions at this time. VS stable. Patient ambulatory with steady gait.  

## 2019-02-21 NOTE — ED Notes (Signed)
Provided patient ice water - tolerating well.

## 2019-02-21 NOTE — ED Notes (Signed)
Pt sating 91%-94% on RA. Placed on 2L [place]. Now 96-97%

## 2019-02-21 NOTE — ED Provider Notes (Signed)
  Physical Exam  BP 117/62 (BP Location: Right Arm)   Pulse 76   Temp 99.6 F (37.6 C) (Oral)   Resp 19   Ht 5\' 9"  (1.753 m)   Wt 99.8 kg   SpO2 95%   BMI 32.49 kg/m   Physical Exam  ED Course/Procedures     Procedures  MDM  Received patient in signout.  Community-acquired pneumonia on x-ray.  Feels much better.  Has been afebrile.  Sats went to 90% on ambulation without any oxygen.  States he feels he can manage at home.  Will give antibiotics.  Lactic acid is normalized.  Discharge home.       Davonna Belling, MD 02/21/19 [phone]

## 2019-02-21 NOTE — ED Notes (Signed)
Walked patient with pulse oxy stayed at 93 room air patient did go down to 90 on room air. Patient stated that he didn't fell short of breathe but he stated that he was dizzy patient is now back in bed on the monitor call bell in reach

## 2019-02-26 LAB — CULTURE, BLOOD (ROUTINE X 2)
Culture: NO GROWTH
Culture: NO GROWTH

## 2020-09-27 ENCOUNTER — Emergency Department (HOSPITAL_COMMUNITY)
Admission: EM | Admit: 2020-09-27 | Discharge: 2020-09-27 | Disposition: A | Discharge status: Home / Self Care | Payer: BLUE CROSS/BLUE SHIELD | Attending: Emergency Medicine | Admitting: Emergency Medicine

## 2020-09-27 ENCOUNTER — Encounter (HOSPITAL_COMMUNITY): Payer: Self-pay | Admitting: Emergency Medicine

## 2020-09-27 ENCOUNTER — Emergency Department (HOSPITAL_COMMUNITY): Payer: BLUE CROSS/BLUE SHIELD

## 2020-09-27 ENCOUNTER — Other Ambulatory Visit: Payer: Self-pay

## 2020-09-27 DIAGNOSIS — Z7984 Long term (current) use of oral hypoglycemic drugs: Secondary | ICD-10-CM | POA: Insufficient documentation

## 2020-09-27 DIAGNOSIS — E119 Type 2 diabetes mellitus without complications: Secondary | ICD-10-CM | POA: Diagnosis not present

## 2020-09-27 DIAGNOSIS — R0602 Shortness of breath: Secondary | ICD-10-CM | POA: Diagnosis not present

## 2020-09-27 DIAGNOSIS — Z7982 Long term (current) use of aspirin: Secondary | ICD-10-CM | POA: Diagnosis not present

## 2020-09-27 DIAGNOSIS — Z87891 Personal history of nicotine dependence: Secondary | ICD-10-CM | POA: Diagnosis not present

## 2020-09-27 DIAGNOSIS — Z951 Presence of aortocoronary bypass graft: Secondary | ICD-10-CM | POA: Insufficient documentation

## 2020-09-27 DIAGNOSIS — Z955 Presence of coronary angioplasty implant and graft: Secondary | ICD-10-CM | POA: Diagnosis not present

## 2020-09-27 DIAGNOSIS — R2243 Localized swelling, mass and lump, lower limb, bilateral: Secondary | ICD-10-CM | POA: Insufficient documentation

## 2020-09-27 DIAGNOSIS — Z794 Long term (current) use of insulin: Secondary | ICD-10-CM | POA: Diagnosis not present

## 2020-09-27 DIAGNOSIS — I10 Essential (primary) hypertension: Secondary | ICD-10-CM | POA: Diagnosis not present

## 2020-09-27 DIAGNOSIS — T1490XA Injury, unspecified, initial encounter: Secondary | ICD-10-CM

## 2020-09-27 DIAGNOSIS — M7989 Other specified soft tissue disorders: Secondary | ICD-10-CM

## 2020-09-27 DIAGNOSIS — I2510 Atherosclerotic heart disease of native coronary artery without angina pectoris: Secondary | ICD-10-CM | POA: Diagnosis not present

## 2020-09-27 DIAGNOSIS — Z79899 Other long term (current) drug therapy: Secondary | ICD-10-CM | POA: Insufficient documentation

## 2020-09-27 LAB — CBC WITH DIFFERENTIAL/PLATELET
Abs Immature Granulocytes: 0.02 10*3/uL (ref 0.00–0.07)
Basophils Absolute: 0 10*3/uL (ref 0.0–0.1)
Basophils Relative: 0 %
Eosinophils Absolute: 0.3 10*3/uL (ref 0.0–0.5)
Eosinophils Relative: 4 %
HCT: 38.3 % — ABNORMAL LOW (ref 39.0–52.0)
Hemoglobin: 12.3 g/dL — ABNORMAL LOW (ref 13.0–17.0)
Immature Granulocytes: 0 %
Lymphocytes Relative: 41 %
Lymphs Abs: 2.5 10*3/uL (ref 0.7–4.0)
MCH: 29.8 pg (ref 26.0–34.0)
MCHC: 32.1 g/dL (ref 30.0–36.0)
MCV: 92.7 fL (ref 80.0–100.0)
Monocytes Absolute: 0.5 10*3/uL (ref 0.1–1.0)
Monocytes Relative: 8 %
Neutro Abs: 2.9 10*3/uL (ref 1.7–7.7)
Neutrophils Relative %: 47 %
Platelets: 192 10*3/uL (ref 150–400)
RBC: 4.13 MIL/uL — ABNORMAL LOW (ref 4.22–5.81)
RDW: 14 % (ref 11.5–15.5)
WBC: 6.2 10*3/uL (ref 4.0–10.5)
nRBC: 0 % (ref 0.0–0.2)

## 2020-09-27 LAB — BASIC METABOLIC PANEL
Anion gap: 12 (ref 5–15)
BUN: 25 mg/dL — ABNORMAL HIGH (ref 8–23)
CO2: 25 mmol/L (ref 22–32)
Calcium: 8.8 mg/dL — ABNORMAL LOW (ref 8.9–10.3)
Chloride: 102 mmol/L (ref 98–111)
Creatinine, Ser: 1.78 mg/dL — ABNORMAL HIGH (ref 0.61–1.24)
GFR, Estimated: 43 mL/min — ABNORMAL LOW (ref >60)
Glucose, Bld: 424 mg/dL — ABNORMAL HIGH (ref 70–99)
Potassium: 4.5 mmol/L (ref 3.5–5.1)
Sodium: 139 mmol/L (ref 135–145)

## 2020-09-27 LAB — BRAIN NATRIURETIC PEPTIDE: B Natriuretic Peptide: 76.4 pg/mL (ref 0.0–100.0)

## 2020-09-27 MED ORDER — FUROSEMIDE 10 MG/ML IJ SOLN: 40.0000 mg | Freq: Once | INTRAMUSCULAR | Status: DC

## 2020-09-27 NOTE — Discharge Instructions (Addendum)
Take your furosemide (lasix), in the morning and at bedtime for the next 2 days. It is important that you follow closely with your primary care to recheck your kidney function and potassium level after this dose increase of your medication.  Elevate your legs as much as possible to help with swelling. You can wear compression stockings throughout the day to help with swelling as well. Return to the ER if you develop worsening shortness of breath or chest pain.

## 2020-09-27 NOTE — ED Notes (Signed)
Main lab to add on BNP to blood in lab

## 2020-09-27 NOTE — ED Triage Notes (Signed)
Patient reports bilateral feet/ankles swelling since Tuesday this week , denies injury/amnulatory , respirations unlabored /no chest pain .

## 2020-09-27 NOTE — ED Provider Notes (Signed)
[facility] EMERGENCY DEPARTMENT Provider Note   CSN: [id number] Arrival date & time: 09/27/20  1904     History Chief Complaint  Patient presents with   Feet/Ankle swelling    Benjamin Baker is a 64 y.o. male past medical history of CAD, diabetes, hypertension, presenting to the emergency department with complaint of bilateral lower leg swelling.  Patient states he is here from out of town for the holiday.  He was evaluated at his PCP on Monday who switched his prior fluid pill to Lasix 40 mg once daily.  Is unsure of the medication he was on before that.  He states he has been having gradual 10 pound weight gain over the last month which seems abnormal for him.  His leg swelling worsened on Monday.  He felt a little bit winded walking into the emergency department today denies any chest pain.  He thinks he may recall somebody telling him during a recent admission that he had congestive heart failure though he is unsure.  He has not had any recent cardiac echoes done.  He denies any recent increase in salt intake.  No redness to his legs.  The history is provided by the patient.       Past Medical History:  Diagnosis Date   CAD (coronary artery disease)    a. 03/2002 - CABG x 3: LIMA > LAD, R Rad > OM2, SVG > PDA.    b. Cath 06/2012 - Significant three-vessel disease, 3/3 grafts patent. LVEF 60-65%    Diabetes mellitus    GERD (gastroesophageal reflux disease)    Hyperlipidemia    Hypertension    Kidney stone    Kidney stone on left side 05/11/2013   Kidney stones    NSTEMI (non-ST elevated myocardial infarction) [facility]) September 2013   grafts patent; EF normal; manage medically   Tobacco abuse    a. ongoing 06/2012    Patient Active Problem List   Diagnosis Date Noted   Coronary artery disease 02/20/2018   Diabetes mellitus type 2, uncomplicated ([place]) 67/09/4579   DVT, lower extremity ([place]) 05/29/2014   Non-proliferative diabetic retinopathy,  mild, left eye ([place]) 05/05/2014   Seasonal allergies 03/06/2014   Acute kidney injury ([place]) 05/12/2013   Nephrolithiasis 05/11/2013   NSTEMI (non-ST elevated myocardial infarction) ([place]) 06/29/2012   Claudication ([place]) 06/28/2012   Preventative health care 08/29/2011   DIZZINESS 10/24/2007   DYSLIPIDEMIA 09/24/2006   TOBACCO ABUSE 09/24/2006   CATARACTS 09/24/2006   HYPERTENSION 09/24/2006   GERD 09/24/2006   CORONARY ARTERY BYPASS GRAFT, THREE VESSEL, HX OF 09/24/2006   DIABETES MELLITUS, TYPE II 10/04/2001    Past Surgical History:  Procedure Laterality Date   CORONARY ARTERY BYPASS GRAFT  03/16/2002   a) x3: LIMA-LAD, R rad-Cx branch, SVG-PDA    CYSTOSCOPY WITH URETEROSCOPY AND STENT PLACEMENT Left 05/12/2013   Procedure: CYSTOSCOPY WITH URETEROSCOPY AND STENT PLACEMENT;  Surgeon: Malka So, MD;  Location: [facility] ORS;  Service: Urology;  Laterality: Left;   HOLMIUM LASER APPLICATION Left 06/12/8337   Procedure: HOLMIUM LASER APPLICATION;  Surgeon: Malka So, MD;  Location: [facility] OR[facility];  Service: Urology;  Laterality: Left;   LEFT HEART CATHETERIZATION WITH CORONARY/GRAFT ANGIOGRAM N/A 06/28/2012   Procedure: LEFT HEART CATHETERIZATION WITH Beatrix Fetters;  Surgeon: Sherren Mocha, MD;  Location: [facility] CATH LAB;  Service: Cardiovascular;  Laterality: N/A;       Family History  Problem Relation Age of Onset   Insulin resistance Mother 42  Colon cancer Father 73   Heart attack Father 66   Stomach cancer Neg Hx     Social History   Tobacco Use   Smoking status: Former Smoker    Packs/day: 0.50    Years: 30.00    Pack years: 15.00    Types: Cigarettes   Smokeless tobacco: Never Used  Substance Use Topics   Alcohol use: No   Drug use: No    Home Medications Prior to Admission medications   Medication Sig Start Date End Date Taking? Authorizing Provider  albuterol (VENTOLIN HFA) 108 (90 Base) MCG/ACT inhaler Inhale 1-2 puffs into the lungs  every 6 (six) hours as needed for wheezing or shortness of breath.    [provider]  amoxicillin-clavulanate (AUGMENTIN) 875-125 MG tablet Take 1 tablet by mouth every 12 (twelve) hours. 02/21/19   Benjiman Core, MD  aspirin EC 81 MG tablet Take 81 mg by mouth daily.    [provider]  atorvastatin (LIPITOR) 40 MG tablet Take 40 mg by mouth daily.    [provider]  azithromycin (ZITHROMAX) 250 MG tablet Take 1 tablet (250 mg total) by mouth daily. 1 every day until finished starting tomorrow 02/21/19   Benjiman Core, MD  CONTOUR NEXT TEST test strip  08/12/17   [provider]  gabapentin (NEURONTIN) 100 MG capsule Take 1 capsule (100 mg total) by mouth 3 (three) times daily. Patient not taking: Reported on 12/08/2018 01/11/18   Felecia Shelling, DPM  insulin NPH-regular Human (NOVOLIN 70/30) (70-30) 100 UNIT/ML injection Inject 45 units into the skin every morning with breakfast and inject 45 units into the skin every evening with supper Patient taking differently: Inject 55 Units into the skin 2 (two) times daily with a meal.  12/23/14   Ahmed, Elyn Peers, MD  lisinopril (PRINIVIL,ZESTRIL) 10 MG tablet Take 10 mg by mouth daily.    [provider]  meclizine (ANTIVERT) 25 MG tablet Take 1 tablet (25 mg total) by mouth 3 (three) times daily as needed for dizziness. 12/08/18   Zadie Rhine, MD  metFORMIN (GLUCOPHAGE) 1000 MG tablet Take 1 tablet (1,000 mg total) by mouth 2 (two) times daily with a meal. 10/08/14   Ahmed, Elyn Peers, MD  propranolol (INDERAL) 20 MG tablet Take 20 mg by mouth daily.     [provider]    Allergies    Patient has no known allergies.  Review of Systems   Review of Systems  Respiratory: Positive for shortness of breath.   Cardiovascular: Positive for leg swelling.  All other systems reviewed and are negative.   Physical Exam Updated Vital Signs BP 138/79 (BP Location: Right Arm)    Pulse 92    Temp 97.7 F  (36.5 C) (Oral)    Resp 20    Ht 5\' 9"  (1.753 m)    Wt 122 kg    SpO2 99%    BMI 39.72 kg/m   Physical Exam Vitals and nursing note reviewed.  Constitutional:      General: He is not in acute distress.    Appearance: He is well-developed and well-nourished.  HENT:     Head: Normocephalic and atraumatic.  Eyes:     Conjunctiva/sclera: Conjunctivae normal.  Cardiovascular:     Rate and Rhythm: Normal rate and regular rhythm.  Pulmonary:     Effort: Pulmonary effort is normal. No respiratory distress.     Breath sounds: Normal breath sounds.  Abdominal:     General: Bowel sounds  are normal.     Palpations: Abdomen is soft.     Tenderness: There is no abdominal tenderness.  Musculoskeletal:     Comments: 3+ pitting edema bilateral lower leg.  No erythema or warmth.  Skin:    General: Skin is warm.  Neurological:     Mental Status: He is alert.  Psychiatric:        Mood and Affect: Mood and affect normal.        Behavior: Behavior normal.     ED Results / Procedures / Treatments   Labs (all labs ordered are listed, but only abnormal results are displayed) Labs Reviewed  CBC WITH DIFFERENTIAL/PLATELET - Abnormal; Notable for the following components:      Result Value   RBC 4.13 (*)    Hemoglobin 12.3 (*)    HCT 38.3 (*)    All other components within normal limits  BASIC METABOLIC PANEL - Abnormal; Notable for the following components:   Glucose, Bld 424 (*)    BUN 25 (*)    Creatinine, Ser 1.78 (*)    Calcium 8.8 (*)    GFR, Estimated 43 (*)    All other components within normal limits  BRAIN NATRIURETIC PEPTIDE    EKG None  Radiology DG Chest Port 1 View  Result Date: 09/27/2020 CLINICAL DATA:  BILATERAL lower extremity swelling since Monday or Tuesday, mild shortness of breath EXAM: PORTABLE CHEST 1 VIEW COMPARISON:  Portable exam 2047 hours compared to 02/21/2019 FINDINGS: Enlargement of cardiac silhouette post CABG. Mediastinal contours and pulmonary  vascularity normal. Lungs clear. No infiltrate, pleural effusion or pneumothorax. Improved infiltrate at RIGHT base versus prior study. Bones demineralized. IMPRESSION: Enlargement of cardiac silhouette post CABG. No acute abnormalities. Electronically Signed   By: Ulyses Southward M.D.   On: 09/27/2020 20:57    Procedures Procedures (including critical care time)  Medications Ordered in ED Medications  furosemide (LASIX) injection 40 mg (has no administration in time range)    ED Course  I have reviewed the triage vital signs and the nursing notes.  Pertinent labs & imaging results that were available during my care of the patient were reviewed by me and considered in my medical decision making (see chart for details).    MDM Rules/Calculators/A&P                          Patient here for lower leg swelling, some mild intermittent shortness of breath on exertion.  Reports possible history of CHF though no recent cardiac echoes are visible in Care Everywhere.  States his PCP switched his diuretic on Monday to 40 mg Lasix once daily though this has not provided much relief.  He has 3+ pitting edema to bilateral lower legs, no redness or warmth.  Vital signs are stable.  Labs obtained in triage are without acute change from baseline.  Will add on BNP, chest x-ray and EKG for further assessment.  BNP is within normal limits.  Chest x-ray without edema. Care plan discussed with attending Dr. Adela Lank. Patient given a dose of IV lasix. He is appropriate for discharge with instruction to follow closely with PCP. Recommend he take additional lasix, increased from 40mg  QD to 40mg  BID for 2 days for additional diuresis. He is aware he needs renal function and potassium rechecked within in the next few days. Patient discharged in no distress.  Discussed results, findings, treatment and follow up. Patient advised of return precautions. Patient verbalized  understanding and agreed with plan.  Final Clinical  Impression(s) / ED Diagnoses Final diagnoses:  Leg swelling    Rx / DC Orders ED Discharge Orders    None       Asharia Lotter, [place] N, PA-C 09/27/20 2215    Deno Etienne, DO 09/27/20 2233
